# Patient Record
Sex: Female | Born: 1970 | Race: White | Hispanic: No | Marital: Married | Smoking: Never smoker
Health system: Southern US, Community
[De-identification: ages and names within clinical notes are randomized; demographics above are authoritative.]

## PROBLEM LIST (undated history)

## (undated) DIAGNOSIS — E785 Hyperlipidemia, unspecified: Secondary | ICD-10-CM

## (undated) DIAGNOSIS — E7800 Pure hypercholesterolemia, unspecified: Secondary | ICD-10-CM

## (undated) DIAGNOSIS — T7840XA Allergy, unspecified, initial encounter: Secondary | ICD-10-CM

## (undated) DIAGNOSIS — G479 Sleep disorder, unspecified: Secondary | ICD-10-CM

## (undated) DIAGNOSIS — N189 Chronic kidney disease, unspecified: Secondary | ICD-10-CM

## (undated) DIAGNOSIS — R110 Nausea: Secondary | ICD-10-CM

## (undated) DIAGNOSIS — K589 Irritable bowel syndrome without diarrhea: Secondary | ICD-10-CM

## (undated) DIAGNOSIS — K219 Gastro-esophageal reflux disease without esophagitis: Secondary | ICD-10-CM

## (undated) DIAGNOSIS — L709 Acne, unspecified: Secondary | ICD-10-CM

## (undated) DIAGNOSIS — M1990 Unspecified osteoarthritis, unspecified site: Secondary | ICD-10-CM

## (undated) HISTORY — DX: Irritable bowel syndrome, unspecified: K58.9

## (undated) HISTORY — DX: Hyperlipidemia, unspecified: E78.5

## (undated) HISTORY — DX: Allergy, unspecified, initial encounter: T78.40XA

---

## 1997-12-10 ENCOUNTER — Other Ambulatory Visit: Admission: RE | Admit: 1997-12-10 | Discharge: 1997-12-10 | Payer: Self-pay | Admitting: Obstetrics and Gynecology

## 1998-12-08 ENCOUNTER — Ambulatory Visit (HOSPITAL_COMMUNITY): Admission: RE | Admit: 1998-12-08 | Discharge: 1998-12-08 | Payer: Self-pay | Admitting: Obstetrics and Gynecology

## 1998-12-08 ENCOUNTER — Encounter: Payer: Self-pay | Admitting: Obstetrics and Gynecology

## 1998-12-13 ENCOUNTER — Other Ambulatory Visit: Admission: RE | Admit: 1998-12-13 | Discharge: 1998-12-13 | Payer: Self-pay | Admitting: Obstetrics and Gynecology

## 1999-10-05 ENCOUNTER — Inpatient Hospital Stay (HOSPITAL_COMMUNITY): Admission: AD | Admit: 1999-10-05 | Discharge: 1999-10-07 | Payer: Self-pay | Admitting: Obstetrics and Gynecology

## 1999-10-08 ENCOUNTER — Encounter: Admission: RE | Admit: 1999-10-08 | Discharge: 2000-01-06 | Payer: Self-pay | Admitting: Obstetrics and Gynecology

## 1999-11-08 ENCOUNTER — Other Ambulatory Visit: Admission: RE | Admit: 1999-11-08 | Discharge: 1999-11-08 | Payer: Self-pay | Admitting: Obstetrics and Gynecology

## 2000-12-14 ENCOUNTER — Encounter: Payer: Self-pay | Admitting: Internal Medicine

## 2000-12-14 ENCOUNTER — Ambulatory Visit (HOSPITAL_COMMUNITY): Admission: RE | Admit: 2000-12-14 | Discharge: 2000-12-14 | Payer: Self-pay | Admitting: Internal Medicine

## 2000-12-14 ENCOUNTER — Other Ambulatory Visit: Admission: RE | Admit: 2000-12-14 | Discharge: 2000-12-14 | Payer: Self-pay | Admitting: Obstetrics and Gynecology

## 2002-01-07 ENCOUNTER — Other Ambulatory Visit: Admission: RE | Admit: 2002-01-07 | Discharge: 2002-01-07 | Payer: Self-pay | Admitting: Obstetrics and Gynecology

## 2002-05-08 ENCOUNTER — Ambulatory Visit (HOSPITAL_COMMUNITY): Admission: RE | Admit: 2002-05-08 | Discharge: 2002-05-08 | Payer: Self-pay | Admitting: Internal Medicine

## 2002-05-08 ENCOUNTER — Encounter: Payer: Self-pay | Admitting: Internal Medicine

## 2003-01-29 ENCOUNTER — Other Ambulatory Visit: Admission: RE | Admit: 2003-01-29 | Discharge: 2003-01-29 | Payer: Self-pay | Admitting: Obstetrics and Gynecology

## 2003-03-14 HISTORY — PX: ECTOPIC PREGNANCY SURGERY: SHX613

## 2003-09-03 ENCOUNTER — Encounter (INDEPENDENT_AMBULATORY_CARE_PROVIDER_SITE_OTHER): Payer: Self-pay | Admitting: Specialist

## 2003-09-03 ENCOUNTER — Ambulatory Visit (HOSPITAL_COMMUNITY): Admission: RE | Admit: 2003-09-03 | Discharge: 2003-09-03 | Payer: Self-pay | Admitting: Obstetrics and Gynecology

## 2003-09-13 ENCOUNTER — Inpatient Hospital Stay (HOSPITAL_COMMUNITY): Admission: AD | Admit: 2003-09-13 | Discharge: 2003-09-13 | Payer: Self-pay | Admitting: Obstetrics and Gynecology

## 2003-12-11 ENCOUNTER — Ambulatory Visit (HOSPITAL_COMMUNITY): Admission: RE | Admit: 2003-12-11 | Discharge: 2003-12-11 | Payer: Self-pay | Admitting: Obstetrics and Gynecology

## 2004-02-25 ENCOUNTER — Other Ambulatory Visit: Admission: RE | Admit: 2004-02-25 | Discharge: 2004-02-25 | Payer: Self-pay | Admitting: Obstetrics and Gynecology

## 2004-11-30 ENCOUNTER — Other Ambulatory Visit: Admission: RE | Admit: 2004-11-30 | Discharge: 2004-11-30 | Payer: Self-pay | Admitting: Obstetrics and Gynecology

## 2005-06-07 ENCOUNTER — Inpatient Hospital Stay (HOSPITAL_COMMUNITY): Admission: AD | Admit: 2005-06-07 | Discharge: 2005-06-09 | Payer: Self-pay | Admitting: Obstetrics and Gynecology

## 2005-12-08 ENCOUNTER — Encounter: Payer: Self-pay | Admitting: Vascular Surgery

## 2005-12-08 ENCOUNTER — Ambulatory Visit (HOSPITAL_COMMUNITY): Admission: RE | Admit: 2005-12-08 | Discharge: 2005-12-08 | Payer: Self-pay | Admitting: Obstetrics and Gynecology

## 2007-03-14 HISTORY — PX: PARTIAL HYSTERECTOMY: SHX80

## 2007-06-17 ENCOUNTER — Inpatient Hospital Stay (HOSPITAL_COMMUNITY): Admission: RE | Admit: 2007-06-17 | Discharge: 2007-06-19 | Payer: Self-pay | Admitting: Obstetrics and Gynecology

## 2007-06-17 ENCOUNTER — Encounter (INDEPENDENT_AMBULATORY_CARE_PROVIDER_SITE_OTHER): Payer: Self-pay | Admitting: Obstetrics and Gynecology

## 2009-04-22 ENCOUNTER — Ambulatory Visit (HOSPITAL_COMMUNITY): Admission: RE | Admit: 2009-04-22 | Discharge: 2009-04-22 | Payer: Self-pay | Admitting: Internal Medicine

## 2010-07-26 NOTE — H&P (Signed)
NAMESHARONA, [name]                 ACCOUNT NO.:  [id number]   MEDICAL RECORD NO.:  [id number]          PATIENT TYPE:  AMB   LOCATION:  SDC                           FACILITY:  [facility]   PHYSICIAN:  Guy Sandifer. Henderson Cloud, M.D. DATE OF BIRTH:  Jan 27, 1971   DATE OF ADMISSION:  06/17/2007  DATE OF DISCHARGE:                              HISTORY & PHYSICAL   CHIEF COMPLAINT:  Pelvic pain.   HISTORY OF PRESENT ILLNESS:  This patient is a 40 year old, married,  white female G3, P2 with known severe endometriosis.  She has increasing  premenstrual and menstrual pain each month in her pelvis, lower back and  radiating down her legs.  After discussion of options, she is being  admitted for total abdominal hysterectomy and possible unilateral  salpingo-oophorectomy.  Dr. Vernie [name] will be placing bilateral ureteral  stents during the surgery.  Potential risks and complications have been  discussed with the patient preoperatively.   PAST MEDICAL HISTORY:  1. History of migraine headaches.  2. Exercise-induced asthma   PAST OBSTETRICAL HISTORY:  Vaginal delivery x2.  Ectopic pregnancy x1  treated with methotrexate and laparoscopy.   PAST SURGICAL HISTORY:  Laparoscopy with D&C in 2005.   FAMILY HISTORY:  Heart disease maternal grandmother.  Chronic  hypertension in mother with migraine headaches in mother and sister.  Fibromyalgia in mother.   MEDICATIONS:  1. Allegra one p.o. b.i.d.  2. Sudafed one p.o. daily.   ALLERGIES:  No known drug allergies.   SOCIAL HISTORY:  Denies tobacco, alcohol or drug abuse.   REVIEW OF SYSTEMS:  NEUROLOGIC:  History of headache as above.  CARDIOVASCULAR:  Denies chest pain.  PULMONARY:  Denies shortness of  breath.  GI:  Denies recent change in bowel habits.   PHYSICAL EXAMINATION:  VITAL SIGNS:  Height 5 feet 4 inches, weight  135.2 pounds, blood pressure 110/82.  HEENT:  Without thyromegaly.  LUNGS:  Clear to auscultation.  HEART:  Regular rate and  rhythm.  BACK:  No CVA tenderness.  BREASTS:  Without mass, retraction or discharge.  ABDOMEN:  Soft, nontender without masses.  PELVIC:  Vulva, vagina and cervix without lesions.  Uterus is mobile,  nontender.  Adnexa nontender without masses.  EXTREMITIES:  Grossly  within normal limits.  NEUROLOGIC:  Grossly within normal limits.   ASSESSMENT:  Endometriosis.   PLAN:  Total abdominal hysterectomy, possible unilateral salpingo-  oophorectomy with preoperative bilateral ureteral stents.      Guy Sandifer Henderson Cloud, M.D.  Electronically Signed     JET/MEDQ  D:  06/13/2007  T:  06/13/2007  Job:  [id number]

## 2010-07-26 NOTE — Op Note (Signed)
NAMEANTONIO, WOODHAMS                 ACCOUNT NO.:  [id number]   MEDICAL RECORD NO.:  [id number]          PATIENT TYPE:  AMB   LOCATION:  SDC                           [facility]ACILITY:  [facility]   PHYSICIAN:  Mark C. Vernie Ammons, M.D.  DATE OF BIRTH:  March 13, 1971   DATE OF PROCEDURE:  06/17/2007  DATE OF DISCHARGE:                               OPERATIVE REPORT   PREOPERATIVE DIAGNOSIS:  Extensive pelvic adhesions with planned  abdominal hysterectomy.   POSTOPERATIVE DIAGNOSIS:  Extensive pelvic adhesions with planned  abdominal hysterectomy.   PROCEDURE:  Cystoscopy and bilateral internal-external stent placement.   SURGEON:  Mark C. Vernie Ammons, M.D.   ANESTHESIA:  General.   DRAINS:  A 5-French internal-external stent in the left ureter and 6-  French internal-external stent in the right ureter as well as Foley  catheter.   BLOOD LOSS:  None.   SPECIMENS:  None.   COMPLICATIONS:  None.   INDICATIONS:  The patient is a 40 year old white female who has had  previous known pelvic adhesions seen laparoscopically and is scheduled  for abdominal hysterectomy as well as possible bilateral salpingo-  oophorectomy.  Because of the potential significant adhesions, ureteral  stents were inserted in order to more easily identify the ureters  intraoperatively.  I have discussed this with the patient as well as the  risks and complications.  She understands and has elected to proceed.   DESCRIPTION OF OPERATION:  After informed consent, the patient brought  to the major OR, placed on the table and administered general  anesthesia, then moved to the dorsal lithotomy position.  Her genitalia  was sterilely prepped and draped and a 21-French cystoscope with 12  degree lens was then inserted in the bladder after an official time-out  was performed.  The bladder was then fully inspected and noted to be  free of any tumor, stones or inflammatory lesions.  Ureteral orifices  were of normal configuration and  position.   A 0.038 inch floppy tip guidewire was then passed up the left ureter  under fluoroscopic visualization into the area of the renal pelvis,  after which the 5-French open-ended ureteral catheter was passed over  the guidewire and the guidewire was removed.  I then directed my  attention to the right orifice and an identical procedure was performed  using the 6-French open ended ureteral catheter.  I then drained the  bladder and inserted a Foley catheter and  connected both of the internal-external stents and Foley catheter to a  closed system drainage and the patient was then turned over to Dr.  Henderson Cloud who performed his portion of the procedure which will be  dictated by him separately.      Mark C. Vernie Ammons, M.D.  Electronically Signed     MCO/MEDQ  D:  06/17/2007  T:  06/17/2007  Job:  [id number]

## 2010-07-26 NOTE — Op Note (Signed)
[name], [name]                 ACCOUNT NO.:  [id number]   MEDICAL RECORD NO.:  [id number]          PATIENT TYPE:  AMB   LOCATION:  SDC                           FACILITY:  [facility]   PHYSICIAN:  Guy Sandifer. Henderson Cloud, M.D. DATE OF BIRTH:  September 09, 1970   DATE OF PROCEDURE:  06/17/2007  DATE OF DISCHARGE:                               OPERATIVE REPORT   PREOPERATIVE DIAGNOSIS:  Endometriosis.   POSTOPERATIVE DIAGNOSIS:  Endometriosis.   PROCEDURE:  Total abdominal hysterectomy.   SURGEON:  Harold Hedge, MD.   ASSISTANT:  Richardean Chimera, MD.   ANESTHESIA:  General with endotracheal intubation.   SPECIMENS:  Uterus to pathology.   ESTIMATED BLOOD LOSS:  300 mL.   INDICATIONS AND CONSENT:  This patient is a 40 year old, married, white  female, G3, P2 with known endometriosis.  Details are dictated in the  history and physical.  She is admitted for total abdominal hysterectomy,  possible unilateral salpingo-oophorectomy.  The risks and complications  have been discussed preoperatively including but not limited to  infection, organ damage, bleeding requiring transfusion of blood  products with possible transfusion reaction, HIV and hepatitis  acquisition, DVT, PE, pneumonia, fistula formation, postoperative  dyspareunia and recurrent endometriosis.  Options of retaining or  removing ovaries have been reviewed.  She would like to keep one or both  ovaries if possible and remove only one ovary if necessary.  Because her  history of adhesions noted on previous laparoscopy, Dr. Vernie Ammons will  place ureteral stents preoperatively.   FINDINGS:  The uterus is about 6 weeks in size.  Anterior and posterior  cul-de-sacs are normal.  There are small brown implants of endometriosis  on the right ovary. The left ovary appears normal.   DESCRIPTION OF PROCEDURE:  Dr. Vernie Ammons places bilateral ureteral stents  preoperatively.  This is dictated under a separate note.  The patient is  then prepped  abdominally and vaginally and draped in a sterile fashion.  A Pfannenstiel incision is made and dissection is carried out in layers  to the peritoneum.  The peritoneum is entered and the incision is  extended superiorly and inferiorly.  An O'Sullivan-O'Connor retractor is  placed.  A bladder blade is placed, the bowel is packed away and the  upper blade is placed.  The above findings were noted.  The proximal  ligaments are grasped bilaterally with Kelly clamps.  The left round  ligament is then ligated with zero Monocryl suture.  All suture will be  zero Monocryl unless otherwise designated.  The round ligament is then  taken down.  The anterior leaf of the broad ligament is taken down and  the posterior leaf is sharply perforated.  The ureter can be palpated  well below this point of surgery.  The proximal ligaments are taken down  and the pedicle is doubly ligated with a free tie and then with a  suture.  The vesicouterine peritoneum is taken down, bladder is bluntly  advanced.  After skeletonizing the uterine vessels, the uterine vessels  are taken in progressive bites.  A similar procedure is then  carried out  on the right side. The uterosacral ligaments are taken down while  entering the vaginal fornix bilaterally with curved clamps. These  pedicles are ligated with transfixion sutures.  The remainder of the  uterus is then cut free.  Inspection reveals the entire cervix has been  removed. The vaginal cuff is then closed with figure-of-eights.  The  uterosacral ligaments are plicated in the midline with a separate  suture.  The ovaries are suspended from the round ligaments bilaterally.  Careful attention is paid and irrigation is carried out and all bleeders  are cauterized. Good hemostasis is obtained.  All packs and retractors  and removed.  The anterior peritoneum was closed in running fashion with  zero Monocryl suture which was also used to reapproximate the  pyramidalis muscle  in the midline.  The anterior rectus fascia is closed  in running fashion with zero PDS and the skin is closed with clips.  The  Foley catheter as well as the yellow and green ureteral stents were  removed intact.  A fresh catheter was then replaced under sterile  conditions.  All counts correct.  The patient is awakened and taken to  the recovery room in stable condition.      Guy Sandifer Henderson Cloud, M.D.  Electronically Signed     JET/MEDQ  D:  06/17/2007  T:  06/17/2007  Job:  [id number]

## 2010-07-29 NOTE — Discharge Summary (Signed)
[name], [name]                 ACCOUNT NO.:  [id number]   MEDICAL RECORD NO.:  [id number]          PATIENT TYPE:  INP   LOCATION:  9308                          FACILITY:  [facility]   PHYSICIAN:  Guy Sandifer. Henderson Cloud, M.D. DATE OF BIRTH:  02-23-1971   DATE OF ADMISSION:  06/17/2007  DATE OF DISCHARGE:  06/19/2007                               DISCHARGE SUMMARY   ADMITTING DIAGNOSIS:  Endometriosis.   DISCHARGE DIAGNOSIS:  Endometriosis.   PROCEDURE:  On June 17, 2007, total abdominal hysterectomy.   REASON FOR ADMISSION:  This patient is a 40 year old married white  female, G3 P2, with known endometriosis.  She was admitted for surgical  management.   HOSPITAL COURSE:  The patient was admitted to the hospital.  She  undergoes the above procedure.  She has ureteral stent placed  preoperatively by Dr. Freida Busman.  These were removed postoperatively.  On  the evening of surgery, vital signs are stable, she is afebrile with  clear urine output.  On the first postoperative day, hemoglobin is 10.0.  Vital signs are stable.  She remains afebrile.  She has not yet passed  flatus.  On the day of discharge, she is tolerating regular diet and  ambulating well.  Vital signs are stable, she remains afebrile and  pathology is pending.   CONDITION ON DISCHARGE:  Good.   DIET:  Regular as tolerated.   ACTIVITY:  No lifting.  No operation of automobiles.  No vaginal entry.  She is to call the office for problems including but not limited to  temperature of 101, persistent nausea or vomiting, increasing pain, or  heavy vaginal bleeding.   MEDICATIONS:  1. Percocet 5/325 mg, #40, 1-2 p.o. q.6 h. p.r.n.  2. Ibuprofen 600 mg q.6 h. p.r.n.  3. Multivitamin daily.   FOLLOWUP:  Follow up with [place] in the office in 2 weeks.      Guy Sandifer Henderson Cloud, M.D.  Electronically Signed     JET/MEDQ  D:  07/08/2007  T:  07/08/2007  Job:  [id number]

## 2010-07-29 NOTE — Op Note (Signed)
[name], ROLL                             ACCOUNT NO.:  [id number]   MEDICAL RECORD NO.:  [id number]                   PATIENT TYPE:  AMB   LOCATION:  SDC                                  [facility]ILITY:  [facility]   PHYSICIAN:  Guy Sandifer. Arleta Creek, M.D.           DATE OF BIRTH:  05/31/1970   DATE OF PROCEDURE:  09/03/2003  DATE OF DISCHARGE:                                 OPERATIVE REPORT   PREOPERATIVE DIAGNOSIS:  Probable ectopic pregnancy.   POSTOPERATIVE DIAGNOSES:  1. Spontaneous abortion.  2. Bilateral ovarian cyst.  3. Dense pelvic adhesions.   PROCEDURES:  1. Laparoscopy.  2. Dilatation and curettage.   SURGEON:  Guy Sandifer. Henderson Cloud, M.D.   ANESTHESIA:  General with endotracheal intubation.   ESTIMATED BLOOD LOSS:  Less than 50 mL.   SPECIMENS:  Endometrial curettings.   INDICATIONS FOR PROCEDURE:  This patient is a 40 year old married white  female G2, P1 with an LMP of Aug 08, 2003.  Quantitative HCG on August 31, 2003, was 7244 and on September 03, 2003, 6878.  She has complaints of increasing  pelvic pain.  There is no syncope, nausea, vomiting, or fever.  Bowel and  bladder function is normal.  Ultrasound is consistent with a 9.1 cm cystic  right adnexal mass with a probable 2.6 cm gestational sac.  Left ovary  contains 4.8 and 4.7 cm simple cysts.  It is felt the picture in the right  adnexa is most consistent with a probable ectopic pregnancy and possible  hemorrhage in view of the patient's increasing pain.  The above is discussed  with the patient and her husband.  Laparoscopy with possible salpingectomy,  possible salpingostomy is discussed.  Potential risks and complications are  reviewed preoperatively including, but not limited to, infection, bowel,  bladder, ureteral damage, bleeding requiring transfusion of blood products  and possible transfusion reaction, HIV and hepatitis acquisition, DVT, PE,  and pneumonia.  All questions have been answered and consent is  signed on  the chart.   FINDINGS:  Upper abdomen is grossly normal.  Uterus is six weeks in size.  Posterior cul-de-sac is completely obliterated up to the mid level of the  fundus.  The left adnexa contains a translucent approximately 8 cm cystic  structure that is widely adherent to the pelvic sidewall.  The left  fallopian tube is normal.  The fimbria are somewhat gloved but there is no  kinking or stricture or abnormal swelling.  The right ovary is widely  adherent to the right pelvic sidewall and is enlarged to at least 8 to 10 cm  in size.  The right fallopian tube is edematous along its entire length.  There is a slight bulge in the middle one third possibly consistent with an  ectopic pregnancy.  There is no bleeding from the fallopian tube.  Anterior  cul-de-sac is normal.   DESCRIPTION OF PROCEDURE:  The patient is taken to the operating room where  she is identified, placed in the dorsal lithotomy position and general  anesthesia is induced via endotracheal intubation.  She is then placed in  the dorsal lithotomy position where she is prepped abdominally and  vaginally.  Bladder is straight catheterized. Hulka tenaculum is placed in  the uterus as a manipulator and she is draped in a sterile fashion.  A small  infraumbilical incision is made and a disposable Veress needle  is placed  with a normal syringe and drop test.  There are 1.5 liters of gas  insufflated under low pressure with good tympany in the right upper  quadrant.  Veress needle  is removed and is replaced with a 10-11 disposable  trocar sleeve without difficulty.  Placement is verified with a laparoscope  and no damage to the surrounding structures is noted.  A small suprapubic  incision is made and a 5 mm disposable trocar sleeve is placed under direct  visualization without difficulty.  The above findings are noted.  The left  lower quadrant incision is then made after careful transillumination and a 5  mm  disposable trocar sleeve is placed under direct visualization without  difficulty.  A bipolar needle from [employer] is used to open the serosa of the  right fallopian tube over the bulging area.  Careful dissection is carried  out which does enter the muscularis of the fallopian tube.  No decidual  tissue.  No ectopic pregnancy is located.  Excellent hemostasis is  maintained.  Careful inspection throughout again reveals no further evidence  of possible ectopic pregnancy.  Therefore, the trocar sleeves are removed,  pneumoperitoneum is reduced and umbilical trocar sleeve is removed.  A 2-0  Vicryl suture is used to close the subcutaneous tissues in the umbilical  incision.  All incisions are then injected with 0.5% Marcaine and Dermabond  is applied to the skin at all three incisions.  Attention is then returned  to the pelvis.  The Hulka tenaculum is removed.  Cervix is grasped with a  single-tooth tenaculum and is gently dilated up to a 29 Pratt dilator.  Alternating sharp and suction curettage is carried out for a small amount of  tissue.  There are 10 units of Pitocin added to the remaining 500 mL of IV  fluids.  Good hemostasis is noted.  All counts correct.  The patient is  awakened and taken to the recovery room in stable condition.  Blood type is  A positive.                                               Guy Sandifer Arleta Creek, M.D.    JET/MEDQ  D:  09/03/2003  T:  09/04/2003  Job:  [id number]

## 2010-08-11 ENCOUNTER — Encounter: Payer: Self-pay | Admitting: Podiatry

## 2010-12-06 LAB — URINALYSIS, ROUTINE W REFLEX MICROSCOPIC
Bilirubin Urine: NEGATIVE
Nitrite: NEGATIVE
Specific Gravity, Urine: 1.01
Urobilinogen, UA: 0.2
pH: 6

## 2010-12-06 LAB — COMPREHENSIVE METABOLIC PANEL
AST: 17
BUN: 7
CO2: 31
Calcium: 9.3
Chloride: 102
Creatinine, Ser: 0.71
GFR calc non Af Amer: >60
Glucose, Bld: 79
Total Bilirubin: 0.6

## 2010-12-06 LAB — CBC
HCT: 28.6 — ABNORMAL LOW
HCT: 40.5
Hemoglobin: 13.9
MCHC: 34.2
MCV: 83.8
Platelets: 182
RBC: 4.83
RDW: 12.7
WBC: 8.2

## 2011-05-26 ENCOUNTER — Ambulatory Visit (HOSPITAL_COMMUNITY): Payer: Self-pay

## 2011-05-26 ENCOUNTER — Other Ambulatory Visit (HOSPITAL_COMMUNITY): Payer: Self-pay | Admitting: Internal Medicine

## 2011-05-26 ENCOUNTER — Other Ambulatory Visit: Payer: Self-pay | Admitting: Internal Medicine

## 2011-05-26 ENCOUNTER — Other Ambulatory Visit: Payer: Self-pay

## 2011-05-26 DIAGNOSIS — K829 Disease of gallbladder, unspecified: Secondary | ICD-10-CM

## 2011-05-30 ENCOUNTER — Ambulatory Visit
Admission: RE | Admit: 2011-05-30 | Discharge: 2011-05-30 | Disposition: A | Discharge status: Home / Self Care | Payer: BC Managed Care – PPO | Source: Ambulatory Visit | Attending: Internal Medicine | Admitting: Internal Medicine

## 2011-05-30 DIAGNOSIS — K829 Disease of gallbladder, unspecified: Secondary | ICD-10-CM

## 2011-06-05 ENCOUNTER — Encounter (HOSPITAL_COMMUNITY)
Admission: RE | Admit: 2011-06-05 | Discharge: 2011-06-05 | Disposition: A | Discharge status: Home / Self Care | Payer: BC Managed Care – PPO | Source: Ambulatory Visit | Attending: Internal Medicine | Admitting: Internal Medicine

## 2011-06-05 ENCOUNTER — Other Ambulatory Visit (HOSPITAL_COMMUNITY): Payer: Self-pay | Admitting: Internal Medicine

## 2011-06-05 DIAGNOSIS — M545 Low back pain, unspecified: Secondary | ICD-10-CM | POA: Insufficient documentation

## 2011-06-05 DIAGNOSIS — R948 Abnormal results of function studies of other organs and systems: Secondary | ICD-10-CM | POA: Insufficient documentation

## 2011-06-05 DIAGNOSIS — R109 Unspecified abdominal pain: Secondary | ICD-10-CM | POA: Insufficient documentation

## 2011-06-05 DIAGNOSIS — R110 Nausea: Secondary | ICD-10-CM | POA: Insufficient documentation

## 2011-06-05 MED ORDER — TECHNETIUM TC 99M MEBROFENIN IV KIT
5.0000 | PACK | Freq: Once | INTRAVENOUS | Status: AC | PRN
  Administered 2011-06-05: 5 via INTRAVENOUS

## 2011-06-05 MED ORDER — SINCALIDE 5 MCG IJ SOLR
0.0200 ug/kg | Freq: Once | INTRAMUSCULAR | Status: AC
  Administered 2011-06-05: 15:00:00 via INTRAVENOUS

## 2011-06-05 MED ORDER — SINCALIDE 5 MCG IJ SOLR
INTRAMUSCULAR | Status: AC
  Filled 2011-06-05: qty 5

## 2011-06-07 ENCOUNTER — Encounter: Payer: Self-pay | Admitting: Internal Medicine

## 2011-06-07 ENCOUNTER — Ambulatory Visit (INDEPENDENT_AMBULATORY_CARE_PROVIDER_SITE_OTHER): Payer: BC Managed Care – PPO | Admitting: Internal Medicine

## 2011-06-07 VITALS — BP 100/76 | HR 100 | Ht 64.0 in | Wt 128.1 lb

## 2011-06-07 DIAGNOSIS — R5383 Other fatigue: Secondary | ICD-10-CM

## 2011-06-07 DIAGNOSIS — E785 Hyperlipidemia, unspecified: Secondary | ICD-10-CM | POA: Insufficient documentation

## 2011-06-07 DIAGNOSIS — R5381 Other malaise: Secondary | ICD-10-CM

## 2011-06-07 DIAGNOSIS — J45909 Unspecified asthma, uncomplicated: Secondary | ICD-10-CM | POA: Insufficient documentation

## 2011-06-07 DIAGNOSIS — R1011 Right upper quadrant pain: Secondary | ICD-10-CM

## 2011-06-07 DIAGNOSIS — E782 Mixed hyperlipidemia: Secondary | ICD-10-CM | POA: Insufficient documentation

## 2011-06-07 DIAGNOSIS — N200 Calculus of kidney: Secondary | ICD-10-CM | POA: Insufficient documentation

## 2011-06-07 DIAGNOSIS — R1013 Epigastric pain: Secondary | ICD-10-CM

## 2011-06-07 DIAGNOSIS — R110 Nausea: Secondary | ICD-10-CM

## 2011-06-07 DIAGNOSIS — K589 Irritable bowel syndrome without diarrhea: Secondary | ICD-10-CM | POA: Insufficient documentation

## 2011-06-07 MED ORDER — PANTOPRAZOLE SODIUM 40 MG PO TBEC: 40.0000 mg | DELAYED_RELEASE_TABLET | Freq: Every day | ORAL | Status: DC

## 2011-06-07 MED ORDER — TRAMADOL HCL 50 MG PO TABS: 50.0000 mg | ORAL_TABLET | Freq: Four times a day (QID) | ORAL | Status: AC | PRN

## 2011-06-07 NOTE — Patient Instructions (Addendum)
You have been scheduled for and Endoscopy at [facility] on 06/13/2011 @ 8:30am, please follow instructions given to you at your visit today.  You have been scheduled a pre-visit on 06/12/2011 @ 1pm at [facility].   You will be contacted about your referral to [facility] Dr. Ovidio Kin.

## 2011-06-07 NOTE — Progress Notes (Signed)
Subjective:    Patient ID: [name], female    DOB: Jun 17, 1970, 41 y.o.   MRN: [id number]  HPI [name] is a 41 year old female with a past medical history of mild asthma, IBS-C, and hyperlipidemia who seen in consultation at the request of Dr. Oneta Rack for evaluation of right-sided abdominal/chest pain. The patient reports about 3 weeks of right upper quadrant versus right chest pain which is radiating into her back and right shoulder. She has had 2 more acute episodes of such pain which lasted 1-2 hours. This pain has been severe and she has been taking as needed hydrocodone to help with the pain and to help with sleep. She has associated on and off nausea but no vomiting. The symptoms worsen with eating her appetite has been poor. She has been avoiding eating as to avoid the pain. She does not particularly have nocturnal pain. She does report more frequent belching, which is not normal for her. During her more acute episode she noticed increased pain with deep inspiration, but not necessarily dyspnea. This pain is nonexertional, and she does exercise frequently without pain. Though, she has avoided exercise recently to she just doesn't feel well. No change in her stools including no melena or blood. She does have a history of irritable bowel constipation for which she uses Amitiza. She is unaware of any fevers or chills.  Review of Systems As per history of present illness, otherwise neg  Past Medical History  Diagnosis Date  . Asthma   . IBS (irritable bowel syndrome)   . HLD (hyperlipidemia)    Past Surgical History  Procedure Date  . Ectopic pregnancy surgery 2005  . Partial hysterectomy 2009   Current Outpatient Prescriptions  Medication Sig Dispense Refill  . aspirin 81 MG tablet Take 81 mg by mouth daily.      . Cholecalciferol (VITAMIN D3) 5000 UNITS CAPS Take 1 capsule by mouth. Three times weekly      . gemfibrozil (LOPID) 600 MG tablet Take 600 mg by mouth 2 (two) times  daily.        Marland Kitchen guaiFENesin (MUCINEX) 600 MG 12 hr tablet Take 1,200 mg by mouth 2 (two) times daily.      Marland Kitchen HYDROcodone-acetaminophen (NORCO) 7.5-325 MG per tablet Take 1 tablet by mouth as needed.      . lubiprostone (AMITIZA) 8 MCG capsule Take 8 mcg by mouth 2 (two) times daily.        . meloxicam (MOBIC) 15 MG tablet Take 15 mg by mouth daily.        . minocycline (MINOCIN,DYNACIN) 100 MG capsule Take 100 mg by mouth daily.      . vitamin E 1000 UNIT capsule Take 1,000 Units by mouth 2 (two) times daily.        . pantoprazole (PROTONIX) 40 MG tablet Take 1 tablet (40 mg total) by mouth daily.  30 tablet  1  . traMADol (ULTRAM) 50 MG tablet Take 1 tablet (50 mg total) by mouth every 6 (six) hours as needed for pain.  120 tablet  1   No Known Allergies  Family History  Problem Relation Age of Onset  . Breast cancer Mother   . Stroke Maternal Grandmother     Social History  . 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 Smoking status: Never Smoker   . Smokeless tobacco: Never Used  .  Alcohol Use: No  . Drug Use: No      Objective:   Physical Exam BP 100/76  Pulse 100  Ht 5\' 4"  (1.626 m)  Wt 128 lb 2 oz (58.117 kg)  BMI 21.99 kg/m2 Constitutional: Well-developed and well-nourished. No distress. HEENT: Normocephalic and atraumatic. Oropharynx is clear and moist. No oropharyngeal exudate. Conjunctivae are normal. Pupils are equal round and reactive to light. No scleral icterus. Neck: Neck supple. Trachea midline. Cardiovascular: Normal rate, regular rhythm and intact distal pulses. No M/R/G, no significant pain with palpation over the right anterior rib cage Pulmonary/chest: Effort normal and breath sounds normal. No wheezing, rales or rhonchi. Abdominal: Soft, mild epigastric and right upper quadrant tenderness to palpation without rebound or guarding, nondistended. Bowel sounds active  throughout. There are no masses palpable. No hepatosplenomegaly. Extremities: no clubbing, cyanosis, or edema Lymphadenopathy: No cervical adenopathy noted. Neurological: Alert and oriented to person place and time. Skin: Skin is warm and dry. No rashes noted. Psychiatric: Normal mood and affect. Behavior is normal.  HIDA scan - 06/05/2011: Impression: There is demonstration of patency of the common bile duct and the cystic duct. There is no evidence of cholecystitis. During CCK infusion the gallbladder contracted 20.4%. This is abnormally low. 30% or greater is normal range. During CCK infusion the patient reported experiencing no symptoms.  ABDOMINAL ULTRASOUND COMPLETE, 05/30/2011   Comparison:  None.   Findings:   Gallbladder:  No gallstones, gallbladder wall thickening, or pericholecystic fluid.Sonographic Murphy's sign is negative.   Common Bile Duct:  Within normal limits in caliber.1.6 mm.   Liver: No focal mass lesion identified.  Within normal limits in parenchymal echogenicity. Portal vein is patent and demonstrates normal direction of flow.   IVC:  Appears normal.   Pancreas:  Although the pancreas is difficult to visualize in its entirety, no focal pancreatic abnormality is identified. Marland Kitchen   Spleen:  Within normal limits in size and echotexture.Measures 5.0 cm in length.   Right kidney:  Normal in size and parenchymal echogenicity.  No evidence of mass or hydronephrosis. A small (6 mm)echogenic focus with suspected posterior acoustic shadowing in the upper right kidney.  Cannot exclude a nonobstructing small stone; however this is not definite.   Left kidney:  Normal in size and parenchymal echogenicity.  No evidence of mass or hydronephrosis.   Abdominal Aorta:  No aneurysm identified.   IMPRESSION:   1.  Possible 6 mm nonobstructing stone in the upper pole the right kidney. 2.  Otherwise, negative examination.  Specifically, the gallbladder, biliary tree,  and liver are normal.  Labs, performed 05/26/2011 WBC 4.3, hemoglobin 13.7, hematocrit 41.7, MCV 85.5, platelet count 191 BMP within normal limits Total bili 0.5 alkaline phosphatase 58, AST 14 ALT 9 Amylase 24     Assessment & Plan:   41 year old female with a past medical history of mild asthma, IBS-C, and hyperlipidemia who seen in consultation at the request of Dr. Oneta Rack for evaluation of right-sided abdominal/chest pain  1. Right-sided abdominal pain/nausea/fatigue -- at present, I feel the differential diagnosis for her overall symptoms include gallbladder disease and peptic ulcer disease. Between the 2, I favor gallbladder dysfunction, though her ultrasound did not reveal stones or evidence of cholecystitis. Her HIDA scan did reveal an abnormally low gallbladder ejection fraction. She does have some risk factors for peptic ulcer disease, namely daily meloxicam use. She's not currently on PPI therapy. I think this is very unlikely to be cardiac related chest pain, given her lack  of risk factors and nature of her pain. She did have a recent unremarkable EKG.  At this point, we have discussed how to proceed with the diagnostic evaluation. I feel that it makes sense to rule out peptic ulcer disease, and therefore I recommended upper endoscopy. I will start her empirically on pantoprazole 40 mg daily. We have arranged EGD next Tuesday at [facility], if this is negative, she is to be referred to Dr. Ovidio Kin with [facility] for evaluation of gallbladder disease/cholecystectomy. I will also give her a prescription for tramadol 50 mg every 4-6 hours when necessary pain. This is to be used in place of hydrocodone, hopefully so that she can function at work without the sedation side effects of hydrocodone. She can still use the hydrocodone when necessary.

## 2011-06-12 ENCOUNTER — Encounter (HOSPITAL_COMMUNITY): Payer: Self-pay

## 2011-06-12 ENCOUNTER — Encounter (HOSPITAL_COMMUNITY)
Admission: RE | Admit: 2011-06-12 | Discharge: 2011-06-12 | Disposition: A | Discharge status: Home / Self Care | Payer: BC Managed Care – PPO | Source: Ambulatory Visit | Attending: Internal Medicine | Admitting: Internal Medicine

## 2011-06-12 NOTE — Anesthesia Preprocedure Evaluation (Signed)
Anesthesia Evaluation  Patient identified by MRN, date of birth, ID band Patient awake    Reviewed: Allergy & Precautions, H&P , NPO status , Patient's Chart, lab work & pertinent test results  Airway Mallampati: I TM Distance: >3 FB Neck ROM: Full    Dental  (+) Teeth Intact and Dental Advisory Given   Pulmonary asthma ,  breath sounds clear to auscultation  Pulmonary exam normal       Cardiovascular negative cardio ROS  Rhythm:Regular Rate:Normal     Neuro/Psych negative neurological ROS  negative psych ROS   GI/Hepatic negative GI ROS, Neg liver ROS,   Endo/Other  negative endocrine ROS  Renal/GU negative Renal ROS  negative genitourinary   Musculoskeletal negative musculoskeletal ROS (+)   Abdominal   Peds  Hematology negative hematology ROS (+)   Anesthesia Other Findings   Reproductive/Obstetrics negative OB ROS                           Anesthesia Physical Anesthesia Plan  ASA: I  Anesthesia Plan: MAC   Post-op Pain Management:    Induction: Intravenous  Airway Management Planned: Nasal Cannula  Additional Equipment:   Intra-op Plan:   Post-operative Plan: Extubation in OR  Informed Consent: I have reviewed the patients History and Physical, chart, labs and discussed the procedure including the risks, benefits and alternatives for the proposed anesthesia with the patient or authorized representative who has indicated his/her understanding and acceptance.   Dental advisory given  Plan Discussed with:   Anesthesia Plan Comments:         Anesthesia Quick Evaluation

## 2011-06-13 ENCOUNTER — Encounter (HOSPITAL_COMMUNITY): Payer: Self-pay | Admitting: Anesthesiology

## 2011-06-13 ENCOUNTER — Ambulatory Visit (HOSPITAL_COMMUNITY): Payer: BC Managed Care – PPO | Admitting: Anesthesiology

## 2011-06-13 ENCOUNTER — Ambulatory Visit (HOSPITAL_COMMUNITY)
Admission: RE | Admit: 2011-06-13 | Discharge: 2011-06-13 | Disposition: A | Discharge status: Home / Self Care | Payer: BC Managed Care – PPO | Source: Ambulatory Visit | Attending: Internal Medicine | Admitting: Internal Medicine

## 2011-06-13 ENCOUNTER — Encounter (HOSPITAL_COMMUNITY)
Admission: RE | Disposition: A | Discharge status: Home / Self Care | Payer: Self-pay | Source: Ambulatory Visit | Attending: Internal Medicine

## 2011-06-13 ENCOUNTER — Encounter (HOSPITAL_COMMUNITY): Payer: Self-pay | Admitting: *Deleted

## 2011-06-13 DIAGNOSIS — K3189 Other diseases of stomach and duodenum: Secondary | ICD-10-CM | POA: Insufficient documentation

## 2011-06-13 DIAGNOSIS — E785 Hyperlipidemia, unspecified: Secondary | ICD-10-CM | POA: Insufficient documentation

## 2011-06-13 DIAGNOSIS — J45909 Unspecified asthma, uncomplicated: Secondary | ICD-10-CM | POA: Insufficient documentation

## 2011-06-13 DIAGNOSIS — Z79899 Other long term (current) drug therapy: Secondary | ICD-10-CM | POA: Insufficient documentation

## 2011-06-13 DIAGNOSIS — K2970 Gastritis, unspecified, without bleeding: Secondary | ICD-10-CM | POA: Insufficient documentation

## 2011-06-13 DIAGNOSIS — R141 Gas pain: Secondary | ICD-10-CM | POA: Insufficient documentation

## 2011-06-13 DIAGNOSIS — Z7982 Long term (current) use of aspirin: Secondary | ICD-10-CM | POA: Insufficient documentation

## 2011-06-13 DIAGNOSIS — R1013 Epigastric pain: Secondary | ICD-10-CM | POA: Insufficient documentation

## 2011-06-13 DIAGNOSIS — R071 Chest pain on breathing: Secondary | ICD-10-CM | POA: Insufficient documentation

## 2011-06-13 DIAGNOSIS — R142 Eructation: Secondary | ICD-10-CM | POA: Insufficient documentation

## 2011-06-13 DIAGNOSIS — K589 Irritable bowel syndrome without diarrhea: Secondary | ICD-10-CM | POA: Insufficient documentation

## 2011-06-13 DIAGNOSIS — R110 Nausea: Secondary | ICD-10-CM | POA: Insufficient documentation

## 2011-06-13 HISTORY — PX: ESOPHAGOGASTRODUODENOSCOPY: SHX5428

## 2011-06-13 SURGERY — EGD (ESOPHAGOGASTRODUODENOSCOPY)
Anesthesia: Monitor Anesthesia Care

## 2011-06-13 MED ORDER — LACTATED RINGERS IV SOLN
INTRAVENOUS | Status: DC
  Administered 2011-06-13 (×2): 1000 mL via INTRAVENOUS

## 2011-06-13 MED ORDER — FENTANYL CITRATE 0.05 MG/ML IJ SOLN
INTRAMUSCULAR | Status: DC | PRN
  Administered 2011-06-13: 100 ug via INTRAVENOUS

## 2011-06-13 MED ORDER — BUTAMBEN-TETRACAINE-BENZOCAINE 2-2-14 % EX AERO
INHALATION_SPRAY | CUTANEOUS | Status: DC | PRN
  Administered 2011-06-13: 2 via TOPICAL

## 2011-06-13 MED ORDER — PROPOFOL 10 MG/ML IV EMUL
INTRAVENOUS | Status: DC | PRN
  Administered 2011-06-13: 100 ug/kg/min via INTRAVENOUS

## 2011-06-13 MED ORDER — MIDAZOLAM HCL 5 MG/5ML IJ SOLN
INTRAMUSCULAR | Status: DC | PRN
  Administered 2011-06-13: 2 mg via INTRAVENOUS
  Administered 2011-06-13: 1 mg via INTRAVENOUS

## 2011-06-13 MED ORDER — KETAMINE HCL 10 MG/ML IJ SOLN
INTRAMUSCULAR | Status: DC | PRN
  Administered 2011-06-13 (×2): 10 mg via INTRAVENOUS

## 2011-06-13 NOTE — H&P (View-Only) (Signed)
Subjective:    Patient ID: [name], female    DOB: March 26, 1970, 41 y.o.   MRN: [id number]  HPI [name] is a 41 year old female with a past medical history of mild asthma, IBS-C, and hyperlipidemia who seen in consultation at the request of Dr. Oneta Rack for evaluation of right-sided abdominal/chest pain. The patient reports about 3 weeks of right upper quadrant versus right chest pain which is radiating into her back and right shoulder. She has had 2 more acute episodes of such pain which lasted 1-2 hours. This pain has been severe and she has been taking as needed hydrocodone to help with the pain and to help with sleep. She has associated on and off nausea but no vomiting. The symptoms worsen with eating her appetite has been poor. She has been avoiding eating as to avoid the pain. She does not particularly have nocturnal pain. She does report more frequent belching, which is not normal for her. During her more acute episode she noticed increased pain with deep inspiration, but not necessarily dyspnea. This pain is nonexertional, and she does exercise frequently without pain. Though, she has avoided exercise recently to she just doesn't feel well. No change in her stools including no melena or blood. She does have a history of irritable bowel constipation for which she uses Amitiza. She is unaware of any fevers or chills.  Review of Systems As per history of present illness, otherwise neg  Past Medical History  Diagnosis Date  . Asthma   . IBS (irritable bowel syndrome)   . HLD (hyperlipidemia)    Past Surgical History  Procedure Date  . Ectopic pregnancy surgery 2005  . Partial hysterectomy 2009   Current Outpatient Prescriptions  Medication Sig Dispense Refill  . aspirin 81 MG tablet Take 81 mg by mouth daily.      . Cholecalciferol (VITAMIN D3) 5000 UNITS CAPS Take 1 capsule by mouth. Three times weekly      . gemfibrozil (LOPID) 600 MG tablet Take 600 mg by mouth 2 (two) times  daily.        Marland Kitchen guaiFENesin (MUCINEX) 600 MG 12 hr tablet Take 1,200 mg by mouth 2 (two) times daily.      Marland Kitchen HYDROcodone-acetaminophen (NORCO) 7.5-325 MG per tablet Take 1 tablet by mouth as needed.      . lubiprostone (AMITIZA) 8 MCG capsule Take 8 mcg by mouth 2 (two) times daily.        . meloxicam (MOBIC) 15 MG tablet Take 15 mg by mouth daily.        . minocycline (MINOCIN,DYNACIN) 100 MG capsule Take 100 mg by mouth daily.      . vitamin E 1000 UNIT capsule Take 1,000 Units by mouth 2 (two) times daily.        . pantoprazole (PROTONIX) 40 MG tablet Take 1 tablet (40 mg total) by mouth daily.  30 tablet  1  . traMADol (ULTRAM) 50 MG tablet Take 1 tablet (50 mg total) by mouth every 6 (six) hours as needed for pain.  120 tablet  1   No Known Allergies  Family History  Problem Relation Age of Onset  . Breast cancer Mother   . Stroke Maternal Grandmother     Social History  . 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 Smoking status: Never Smoker   . Smokeless tobacco: Never Used  .  Alcohol Use: No  . Drug Use: No      Objective:   Physical Exam BP 100/76  Pulse 100  Ht 5\' 4"  (1.626 m)  Wt 128 lb 2 oz (58.117 kg)  BMI 21.99 kg/m2 Constitutional: Well-developed and well-nourished. No distress. HEENT: Normocephalic and atraumatic. Oropharynx is clear and moist. No oropharyngeal exudate. Conjunctivae are normal. Pupils are equal round and reactive to light. No scleral icterus. Neck: Neck supple. Trachea midline. Cardiovascular: Normal rate, regular rhythm and intact distal pulses. No M/R/G, no significant pain with palpation over the right anterior rib cage Pulmonary/chest: Effort normal and breath sounds normal. No wheezing, rales or rhonchi. Abdominal: Soft, mild epigastric and right upper quadrant tenderness to palpation without rebound or guarding, nondistended. Bowel sounds active  throughout. There are no masses palpable. No hepatosplenomegaly. Extremities: no clubbing, cyanosis, or edema Lymphadenopathy: No cervical adenopathy noted. Neurological: Alert and oriented to person place and time. Skin: Skin is warm and dry. No rashes noted. Psychiatric: Normal mood and affect. Behavior is normal.  HIDA scan - 06/05/2011: Impression: There is demonstration of patency of the common bile duct and the cystic duct. There is no evidence of cholecystitis. During CCK infusion the gallbladder contracted 20.4%. This is abnormally low. 30% or greater is normal range. During CCK infusion the patient reported experiencing no symptoms.  ABDOMINAL ULTRASOUND COMPLETE, 05/30/2011   Comparison:  None.   Findings:   Gallbladder:  No gallstones, gallbladder wall thickening, or pericholecystic fluid.Sonographic Murphy's sign is negative.   Common Bile Duct:  Within normal limits in caliber.1.6 mm.   Liver: No focal mass lesion identified.  Within normal limits in parenchymal echogenicity. Portal vein is patent and demonstrates normal direction of flow.   IVC:  Appears normal.   Pancreas:  Although the pancreas is difficult to visualize in its entirety, no focal pancreatic abnormality is identified. Marland Kitchen   Spleen:  Within normal limits in size and echotexture.Measures 5.0 cm in length.   Right kidney:  Normal in size and parenchymal echogenicity.  No evidence of mass or hydronephrosis. A small (6 mm)echogenic focus with suspected posterior acoustic shadowing in the upper right kidney.  Cannot exclude a nonobstructing small stone; however this is not definite.   Left kidney:  Normal in size and parenchymal echogenicity.  No evidence of mass or hydronephrosis.   Abdominal Aorta:  No aneurysm identified.   IMPRESSION:   1.  Possible 6 mm nonobstructing stone in the upper pole the right kidney. 2.  Otherwise, negative examination.  Specifically, the gallbladder, biliary tree,  and liver are normal.  Labs, performed 05/26/2011 WBC 4.3, hemoglobin 13.7, hematocrit 41.7, MCV 85.5, platelet count 191 BMP within normal limits Total bili 0.5 alkaline phosphatase 58, AST 14 ALT 9 Amylase 24     Assessment & Plan:   41 year old female with a past medical history of mild asthma, IBS-C, and hyperlipidemia who seen in consultation at the request of Dr. Oneta Rack for evaluation of right-sided abdominal/chest pain  1. Right-sided abdominal pain/nausea/fatigue -- at present, I feel the differential diagnosis for her overall symptoms include gallbladder disease and peptic ulcer disease. Between the 2, I favor gallbladder dysfunction, though her ultrasound did not reveal stones or evidence of cholecystitis. Her HIDA scan did reveal an abnormally low gallbladder ejection fraction. She does have some risk factors for peptic ulcer disease, namely daily meloxicam use. She's not currently on PPI therapy. I think this is very unlikely to be cardiac related chest pain, given her lack  of risk factors and nature of her pain. She did have a recent unremarkable EKG.  At this point, we have discussed how to proceed with the diagnostic evaluation. I feel that it makes sense to rule out peptic ulcer disease, and therefore I recommended upper endoscopy. I will start her empirically on pantoprazole 40 mg daily. We have arranged EGD next Tuesday at [facility], if this is negative, she is to be referred to Dr. Ovidio Kin with [facility] for evaluation of gallbladder disease/cholecystectomy. I will also give her a prescription for tramadol 50 mg every 4-6 hours when necessary pain. This is to be used in place of hydrocodone, hopefully so that she can function at work without the sedation side effects of hydrocodone. She can still use the hydrocodone when necessary.

## 2011-06-13 NOTE — Op Note (Signed)
[facility] [address] [place], [place]  [address]  ENDOSCOPY PROCEDURE REPORT  PATIENT:  Jennifer, [name]  MR#:  [id number] BIRTHDATE:  [birth year], 40 yrs. old  GENDER:  female ENDOSCOPIST:  Carie Caddy. Mariyana Fulop, MD Referred by:  Lucky Cowboy, M.D. PROCEDURE DATE:  06/13/2011 PROCEDURE:  EGD with biopsy for H. pylori 95284 ASA CLASS:  Class II INDICATIONS:  epigastric pain, dyspepsia, nausea MEDICATIONS:   MAC sedation, administered by CRNA, See Anesthesia Report. TOPICAL ANESTHETIC:  Cetacaine Spray  DESCRIPTION OF PROCEDURE:   After the risks benefits and alternatives of the procedure were thoroughly explained, informed consent was obtained.  The [id number] ([id number]) endoscope was introduced through the mouth and advanced to the second portion of the duodenum, without limitations.  The instrument was slowly withdrawn as the mucosa was fully examined. <<PROCEDUREIMAGES>>  The esophagus and gastroesophageal junction were completely normal in appearance.  Moderate gastritis was found in the body and the antrum of the stomach. Biopsies of the antrum and body of the stomach were obtained and sent to pathology.  The duodenal bulb was normal in appearance, as was the postbulbar duodenum. Retroflexed views revealed no abnormalities.    The scope was then withdrawn from the patient and the procedure completed.  COMPLICATIONS:  None  ENDOSCOPIC IMPRESSION: 1) Normal esophagus 2) Moderate gastritis in the body and the antrum of the stomach. Multiple biopsies taken and sent to pathology. 3) Normal duodenum  RECOMMENDATIONS: 1) Await biopsy results 2) Continue current medications 3) Follow-up of helicobacter pylori status, treat if indicated  Shaylie Eklund M. Rhea Belton, MD  CC:  Lucky Cowboy, MD The Patient  n. eSIGNED:   Carie Caddy. Trev Boley at 06/13/2011 08:54 AM  Alfonso Ramus, [phone]

## 2011-06-13 NOTE — Discharge Instructions (Addendum)
You will be contacted later this week with results of today's biopsies. If H. Pylori infection is found, we will prescribe treatment for you. Please continue with daily pantoprazole 40 mg (30 min to 1 hour before breakfast). Call the office with any questions or concerns [phone])Endoscopy Care After Please read the instructions outlined below and refer to this sheet in the next few weeks. These discharge instructions provide you with general information on caring for yourself after you leave the hospital. Your doctor may also give you specific instructions. While your treatment has been planned according to the most current medical practices available, unavoidable complications occasionally occur. If you have any problems or questions after discharge, please call your doctor. HOME CARE INSTRUCTIONS Activity  You may resume your regular activity but move at a slower pace for the next 24 hours.   Take frequent rest periods for the next 24 hours.   Walking will help expel (get rid of) the air and reduce the bloated feeling in your abdomen.   No driving for 24 hours (because of the anesthesia (medicine) used during the test).   You may shower.   Do not sign any important legal documents or operate any machinery for 24 hours (because of the anesthesia used during the test).  Nutrition  Drink plenty of fluids.   You may resume your normal diet.   Begin with a light meal and progress to your normal diet.   Avoid alcoholic beverages for 24 hours or as instructed by your caregiver.  Medications You may resume your normal medications unless your caregiver tells you otherwise. What you can expect today  You may experience abdominal discomfort such as a feeling of fullness or "gas" pains.   You may experience a sore throat for 2 to 3 days. This is normal. Gargling with salt water may help this.  Follow-up Your doctor will discuss the results of your test with you. SEEK IMMEDIATE  MEDICAL CARE IF:  You have excessive nausea (feeling sick to your stomach) and/or vomiting.   You have severe abdominal pain and distention (swelling).   You have trouble swallowing.   You have a temperature over 100 F (37.8 C).   You have rectal bleeding or vomiting of blood.  Document Released: 10/12/2003 Document Revised: 02/16/2011 Document Reviewed: 04/24/2007 [facility] Patient Information 2012 [place], [place].

## 2011-06-13 NOTE — Transfer of Care (Signed)
Immediate Anesthesia Transfer of Care Note  Patient: [name]  Procedure(s) Performed: Procedure(s) (LRB): ESOPHAGOGASTRODUODENOSCOPY (EGD) (N/A)  Patient Location: PACU and Endoscopy Unit  Anesthesia Type: MAC  Level of Consciousness: awake, oriented, sedated and patient cooperative  Airway & Oxygen Therapy: Patient Spontanous Breathing and Patient connected to nasal cannula oxygen  Post-op Assessment: Report given to PACU RN and Post -op Vital signs reviewed and stable  Post vital signs: Reviewed and stable  Complications: No apparent anesthesia complications

## 2011-06-13 NOTE — Anesthesia Postprocedure Evaluation (Signed)
  Anesthesia Post-op Note  Patient: [name]  Procedure(s) Performed: Procedure(s) (LRB): ESOPHAGOGASTRODUODENOSCOPY (EGD) (N/A)  Patient Location: PACU  Anesthesia Type: MAC  Level of Consciousness: awake and alert   Airway and Oxygen Therapy: Patient Spontanous Breathing  Post-op Pain: mild  Post-op Assessment: Post-op Vital signs reviewed, Patient's Cardiovascular Status Stable, Respiratory Function Stable, Patent Airway and No signs of Nausea or vomiting  Post-op Vital Signs: stable  Complications: No apparent anesthesia complications

## 2011-06-13 NOTE — Interval H&P Note (Signed)
History and Physical Interval Note: Some improvement in nausea and belching.  Still intermittent sharp epigastric pains after eating.  06/13/2011 8:27 AM  [name]  has presented today for surgery, with the diagnosis of upper gastric pain  The various methods of treatment have been discussed with the patient and family. After consideration of risks, benefits and other options for treatment, the patient has consented to  Procedure(s) (LRB): ESOPHAGOGASTRODUODENOSCOPY (EGD) (N/A) as a surgical intervention .  The patients' history has been reviewed, patient examined, no change in status, stable for surgery.  I have reviewed the patients' chart and labs.  Questions were answered to the patient's satisfaction.     Carie Caddy. Adian Jablonowski, M.D.  06/13/2011

## 2011-06-14 ENCOUNTER — Encounter (HOSPITAL_COMMUNITY): Payer: Self-pay | Admitting: Internal Medicine

## 2011-06-16 ENCOUNTER — Telehealth (INDEPENDENT_AMBULATORY_CARE_PROVIDER_SITE_OTHER): Payer: Self-pay

## 2011-06-16 ENCOUNTER — Telehealth: Payer: Self-pay | Admitting: *Deleted

## 2011-06-16 NOTE — Telephone Encounter (Signed)
The patient has an abnl HIDA scan and Dr Rhea Belton would like this appointment moved up sooner.  I told her I will speak to Dr Ezzard Standing and see what we can work out.

## 2011-06-16 NOTE — Telephone Encounter (Signed)
Dr Rhea Belton was not satisfied with pt's appt with Dr Ezzard Standing. Spoke with Maralyn Sago, his nurse who will speak with Dr Ezzard Standing about an earlier appt.

## 2011-06-16 NOTE — Telephone Encounter (Signed)
Message copied by Florene Glen on Fri Jun 16, 2011  2:42 PM ------      Message from: Beverley Fiedler      Created: Fri Jun 16, 2011  9:10 AM       Path results shared by phone.  H Pylori is negative.  Only mild inflammation      Pt continues to have intermittent episodes of pain, today at 5am.  Helped by tramadol.            Please place referral to Dr. Ezzard Standing, with [facility] for eval of gallbladder dysfunction.  This consult may have already been placed/request via [facility], but patient has not heard.      Can you please contact her with [facility] referral info, today on her cell at [phone]       I have recommended a low fat diet and call with more problems or questions.

## 2011-06-20 ENCOUNTER — Encounter: Payer: Self-pay | Admitting: Internal Medicine

## 2011-06-20 NOTE — Telephone Encounter (Signed)
See previous note

## 2011-06-21 NOTE — Telephone Encounter (Signed)
[name] informed me she moved up pt's appt to 06/26/11, arrive at 3pm. Notified pt and gave her directions to the office.; pt stated understanding.

## 2011-06-21 NOTE — Telephone Encounter (Signed)
Spoke with Sarah with Dr Ezzard Standing who will try to speak with Dr Ezzard Standing today about seeing pt earlier.

## 2011-06-26 ENCOUNTER — Ambulatory Visit (INDEPENDENT_AMBULATORY_CARE_PROVIDER_SITE_OTHER): Payer: BC Managed Care – PPO | Admitting: Surgery

## 2011-06-26 ENCOUNTER — Other Ambulatory Visit (INDEPENDENT_AMBULATORY_CARE_PROVIDER_SITE_OTHER): Payer: Self-pay | Admitting: Surgery

## 2011-06-26 ENCOUNTER — Encounter (INDEPENDENT_AMBULATORY_CARE_PROVIDER_SITE_OTHER): Payer: Self-pay | Admitting: Surgery

## 2011-06-26 ENCOUNTER — Telehealth (INDEPENDENT_AMBULATORY_CARE_PROVIDER_SITE_OTHER): Payer: Self-pay | Admitting: Surgery

## 2011-06-26 VITALS — BP 104/80 | HR 78 | Temp 98.6°F | Resp 16 | Ht 64.0 in | Wt 127.0 lb

## 2011-06-26 DIAGNOSIS — K829 Disease of gallbladder, unspecified: Secondary | ICD-10-CM

## 2011-06-26 DIAGNOSIS — R1013 Epigastric pain: Secondary | ICD-10-CM

## 2011-06-26 NOTE — Progress Notes (Signed)
Re:   [name] DOB:   1970-05-11 MRN:   [id number]  ASSESSMENT AND PLAN: 1.  Biliary dyskinesia  I discussed with the patient the indications and risks of gall bladder surgery.  The primary risks of gall bladder surgery include, but are not limited to, bleeding, infection, common bile duct injury, and open surgery.  There is also the risk that the patient may have continued symptoms after surgery.  However, the likelihood of improvement in symptoms and return to the patient's normal status is good. We discussed the typical post-operative recovery course. I tried to answer the patient's questions.  I gave the patient literature about gall bladder surgery.  2.  Asthma, minimal 3.  Hyperlipidemia. 4  Asymptomatic right kidney stone.  Chief Complaint  Patient presents with  . Abdominal Pain  . Nausea   REFERRING PHYSICIAN: Nadean Corwin, MD, MD  HISTORY OF PRESENT ILLNESS: [name] is a 41 y.o. (DOB: 29-Jul-1970)  white female whose primary care physician is Jennifer Corwin, MD, MD and comes to me today for gall bladder disease.  This began with a couple episodes of right chest and back pain.  It came after she was on the treadmill.  She had belching, it hurt to take a deep breath, and she had some nausea after eating.  Because an US showed a right kidney stone, she saw [name] at the urology office.  She did a CT scan, saw the stone and a cyst on her ovary, but no other explanation of the pain.  She was referred to [name], who did an upper endoscopy, which was basically negatve.  She was referred to me with biliary dyskinesia, based on her HIDA scan.  She's also had two more attacks since this all began, but one was after pizza.  She does see some correlation between fatty food and the attacks.  She's has some symptom improvement with Protonix given by [name].  HIDA scan - 06/05/2011 - 20% EF of GB US of abd - 05/30/2011 - possible 6 mm right upper pole  kidney stone, no gall bladder disease. Upper endo - Dr. Shela Commons. [name] - 06/13/2011 -  Negative.  I took care of both her mother and her sister with GB disease.    Past Medical History  Diagnosis Date  . Asthma   . IBS (irritable bowel syndrome)   . HLD (hyperlipidemia)       Past Surgical History  Procedure Date  . Ectopic pregnancy surgery 2005  . Partial hysterectomy 2009  . Esophagogastroduodenoscopy 06/13/2011    Procedure: ESOPHAGOGASTRODUODENOSCOPY (EGD);  Surgeon: Jennifer Fiedler, MD;  Location: [facility];  Service: Gastroenterology;  Laterality: N/A;      Current Outpatient Prescriptions  Medication Sig Dispense Refill  . aspirin 81 MG tablet Take 81 mg by mouth daily.      . Cholecalciferol (VITAMIN D3) 5000 UNITS CAPS Take 1 capsule by mouth. Three times weekly      . gemfibrozil (LOPID) 600 MG tablet Take 600 mg by mouth 2 (two) times daily.        Marland Kitchen guaiFENesin (MUCINEX) 600 MG 12 hr tablet Take 1,200 mg by mouth 2 (two) times daily.      Marland Kitchen HYDROcodone-acetaminophen (NORCO) 7.5-325 MG per tablet Take 1 tablet by mouth as needed.      . lubiprostone (AMITIZA) 8 MCG capsule Take 8 mcg by mouth 2 (two) times daily.        Marland Kitchen  meloxicam (MOBIC) 15 MG tablet Take 15 mg by mouth daily.        . minocycline (MINOCIN,DYNACIN) 100 MG capsule Take 100 mg by mouth daily.      . pantoprazole (PROTONIX) 40 MG tablet Take 1 tablet (40 mg total) by mouth daily.  30 tablet  1  . vitamin E 1000 UNIT capsule Take 1,000 Units by mouth 2 (two) times daily.           No Known Allergies  REVIEW OF SYSTEMS: Skin:  No history of rash.  No history of abnormal moles. Infection:  No history of hepatitis or HIV.  No history of MRSA. Neurologic:  No history of stroke.  No history of seizure.  No history of headaches. Cardiac:  No history of hypertension. No history of heart disease.  No history of prior cardiac catheterization.  No history of seeing a cardiologist. Pulmonary: Asthma as a child,  minimal problems as an adult. Endocrine:  No diabetes. No thyroid disease. Gastrointestinal:  No history of stomach disease.  No history of liver disease.  No history of gall bladder disease.  No history of pancreas disease.  No history of colon disease. GYN: Hysterectomy for endometriosis, history of ectopic pregnancy. Urologic:  Asymptomatic right kidney stone. Musculoskeletal:  No history of joint or back disease. Hematologic:  No bleeding disorder.  No history of anemia.  Not anticoagulated. Psycho-social:  The patient is oriented.   The patient has no obvious psychologic or social impairment to understanding our conversation and plan.  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 I took our her mother's gall bladder, [name], and sister's GB, [name].  PHYSICAL EXAM: BP 104/80  Pulse 78  Temp(Src) 98.6 F (37 C) (Temporal)  Resp 16  Ht 5\' 4"  (1.626 m)  Wt 127 lb (57.607 kg)  BMI 21.80 kg/m2  General: WN thin WF who is alert and generally healthy appearing.  HEENT: Normal. Pupils equal. Good dentition. Neck: Supple. No mass.  No thyroid mass.  Carotid pulse okay with no bruit. Lymph Nodes:  No supraclavicular or cervical nodes. Lungs: Clear to auscultation and symmetric breath sounds. Heart:  RRR. No murmur or rub.  Abdomen: Soft. No mass. No tenderness. No hernia. Normal bowel sounds.  Pfannenstiel incision for hysterectomy. Rectal: Not done. Extremities:  Good strength and ROM  in upper and lower extremities. Neurologic:  Grossly intact to motor and sensory function. Psychiatric: Has normal mood and affect. Behavior is normal.   DATA REVIEWED: Notes from [name] and labs/x-rays.  Ovidio Kin, MD,  [facility] [address] [place]  [address]   [place], [place]    [address] Phone:  [phone] FAX:  [phone]

## 2011-06-27 ENCOUNTER — Telehealth: Payer: Self-pay | Admitting: Internal Medicine

## 2011-06-27 NOTE — Telephone Encounter (Signed)
I called the patient to give her a postop appt for June.  She states she left the scheduler a vm mail that she either wants her surgery moved up or for another md to do her surgery if they can't move it up with Dr Ezzard Standing.  I will give the message to Amy in scheduling.

## 2011-06-27 NOTE — Telephone Encounter (Signed)
Phoned pt who wanted to know if Dr Rhea Belton knew of a surgeon who could do her surgery earlier; she requested Dr Ezzard Standing, but he can do it until 08/09/11. Advised pt to call [facility] to see if another md can do it; pt stated understanding.

## 2011-07-07 ENCOUNTER — Telehealth (INDEPENDENT_AMBULATORY_CARE_PROVIDER_SITE_OTHER): Payer: Self-pay | Admitting: Surgery

## 2011-07-10 NOTE — Telephone Encounter (Signed)
I called the pt and moved up the appt to June 7th

## 2011-07-13 ENCOUNTER — Encounter (HOSPITAL_COMMUNITY): Payer: Self-pay

## 2011-07-13 ENCOUNTER — Ambulatory Visit (HOSPITAL_COMMUNITY): Admit: 2011-07-13 | Payer: Self-pay | Admitting: Internal Medicine

## 2011-07-13 SURGERY — EGD (ESOPHAGOGASTRODUODENOSCOPY)
Anesthesia: Monitor Anesthesia Care

## 2011-07-18 ENCOUNTER — Encounter (HOSPITAL_COMMUNITY): Payer: Self-pay | Admitting: Pharmacy Technician

## 2011-07-24 ENCOUNTER — Encounter (HOSPITAL_COMMUNITY)
Admission: RE | Admit: 2011-07-24 | Discharge: 2011-07-24 | Disposition: A | Discharge status: Home / Self Care | Payer: BC Managed Care – PPO | Source: Ambulatory Visit | Attending: Surgery | Admitting: Surgery

## 2011-07-24 ENCOUNTER — Encounter (HOSPITAL_COMMUNITY): Payer: Self-pay

## 2011-07-24 HISTORY — DX: Nausea: R11.0

## 2011-07-24 HISTORY — DX: Gastro-esophageal reflux disease without esophagitis: K21.9

## 2011-07-24 HISTORY — DX: Pure hypercholesterolemia, unspecified: E78.00

## 2011-07-24 HISTORY — DX: Sleep disorder, unspecified: G47.9

## 2011-07-24 HISTORY — DX: Acne, unspecified: L70.9

## 2011-07-24 LAB — BASIC METABOLIC PANEL
BUN: 11 mg/dL (ref 6–23)
Chloride: 102 mEq/L (ref 96–112)
GFR calc Af Amer: >90 mL/min (ref >90)
GFR calc non Af Amer: >90 mL/min (ref >90)
Potassium: 4 mEq/L (ref 3.5–5.1)
Sodium: 136 mEq/L (ref 135–145)

## 2011-07-24 LAB — CBC
HCT: 40.2 % (ref 36.0–46.0)
MCHC: 34.1 g/dL (ref 30.0–36.0)
Platelets: 267 10*3/uL (ref 150–400)
RDW: 12.4 % (ref 11.5–15.5)
WBC: 5 10*3/uL (ref 4.0–10.5)

## 2011-07-24 LAB — SURGICAL PCR SCREEN
MRSA, PCR: NEGATIVE
Staphylococcus aureus: NEGATIVE

## 2011-07-24 MED ORDER — CHLORHEXIDINE GLUCONATE 4 % EX LIQD
1.0000 "application " | Freq: Once | CUTANEOUS | Status: DC
  Filled 2011-07-24: qty 15

## 2011-07-24 NOTE — Patient Instructions (Signed)
20 [name]  07/24/2011   Your procedure is scheduled on:  07/27/11  Report to SHORT STAY DEPT  at 8:30 AM.  Call this number if you have problems the morning of surgery: [phone]   Remember:   Do not eat food or drink liquids AFTER MIDNIGHT    Take these medicines the morning of surgery with A SIP OF WATER: PROTONIX / MUCINEX IF NEEDED   Do not wear jewelry, make-up or nail polish.  Do not wear lotions, powders, or perfumes.   Do not shave legs or underarms 48 hrs. before surgery (men may shave face)  Do not bring valuables to the hospital.  Contacts, dentures or bridgework may not be worn into surgery.  Leave suitcase in the car. After surgery it may be brought to your room.  For patients admitted to the hospital, checkout time is 11:00 AM the day of discharge.   Patients discharged the day of surgery will not be allowed to drive home.    Special Instructions:   Please read over the following fact sheets that you were given: MRSA  Information               SHOWER WITH BETASEPT THE NIGHT BEFORE SURGERY AND THE MORNING OF SURGERY

## 2011-07-27 ENCOUNTER — Encounter (HOSPITAL_COMMUNITY): Payer: Self-pay | Admitting: Anesthesiology

## 2011-07-27 ENCOUNTER — Ambulatory Visit (HOSPITAL_COMMUNITY): Payer: BC Managed Care – PPO | Admitting: Anesthesiology

## 2011-07-27 ENCOUNTER — Encounter (HOSPITAL_COMMUNITY): Payer: Self-pay | Admitting: *Deleted

## 2011-07-27 ENCOUNTER — Ambulatory Visit (HOSPITAL_COMMUNITY)
Admission: RE | Admit: 2011-07-27 | Discharge: 2011-07-27 | Disposition: A | Discharge status: Home / Self Care | Payer: BC Managed Care – PPO | Source: Ambulatory Visit | Attending: Surgery | Admitting: Surgery

## 2011-07-27 ENCOUNTER — Ambulatory Visit (INDEPENDENT_AMBULATORY_CARE_PROVIDER_SITE_OTHER): Payer: BC Managed Care – PPO | Admitting: Surgery

## 2011-07-27 ENCOUNTER — Ambulatory Visit (HOSPITAL_COMMUNITY): Payer: BC Managed Care – PPO

## 2011-07-27 ENCOUNTER — Encounter (HOSPITAL_COMMUNITY)
Admission: RE | Disposition: A | Discharge status: Home / Self Care | Payer: Self-pay | Source: Ambulatory Visit | Attending: Surgery

## 2011-07-27 DIAGNOSIS — E785 Hyperlipidemia, unspecified: Secondary | ICD-10-CM | POA: Insufficient documentation

## 2011-07-27 DIAGNOSIS — Z79899 Other long term (current) drug therapy: Secondary | ICD-10-CM | POA: Insufficient documentation

## 2011-07-27 DIAGNOSIS — K811 Chronic cholecystitis: Secondary | ICD-10-CM

## 2011-07-27 DIAGNOSIS — Z01812 Encounter for preprocedural laboratory examination: Secondary | ICD-10-CM | POA: Insufficient documentation

## 2011-07-27 DIAGNOSIS — Z7982 Long term (current) use of aspirin: Secondary | ICD-10-CM | POA: Insufficient documentation

## 2011-07-27 DIAGNOSIS — K829 Disease of gallbladder, unspecified: Secondary | ICD-10-CM

## 2011-07-27 HISTORY — PX: CHOLECYSTECTOMY: SHX55

## 2011-07-27 SURGERY — LAPAROSCOPIC CHOLECYSTECTOMY WITH INTRAOPERATIVE CHOLANGIOGRAM
Anesthesia: General | Wound class: Clean Contaminated

## 2011-07-27 MED ORDER — HYDROCODONE-ACETAMINOPHEN 5-325 MG PO TABS: 1.0000 | ORAL_TABLET | Freq: Four times a day (QID) | ORAL | Status: DC | PRN

## 2011-07-27 MED ORDER — FENTANYL CITRATE 0.05 MG/ML IJ SOLN
INTRAMUSCULAR | Status: AC
  Filled 2011-07-27: qty 2

## 2011-07-27 MED ORDER — PROPOFOL 10 MG/ML IV BOLUS
INTRAVENOUS | Status: DC | PRN
  Administered 2011-07-27: 120 mg via INTRAVENOUS

## 2011-07-27 MED ORDER — DIATRIZOATE MEGLUMINE 30 % UR SOLN
URETHRAL | Status: DC | PRN
  Administered 2011-07-27: 3 mL

## 2011-07-27 MED ORDER — IOHEXOL 300 MG/ML  SOLN
INTRAMUSCULAR | Status: AC
  Filled 2011-07-27: qty 1

## 2011-07-27 MED ORDER — LACTATED RINGERS IV SOLN
INTRAVENOUS | Status: DC
  Administered 2011-07-27: 1000 mL via INTRAVENOUS
  Administered 2011-07-27: 12:00:00 via INTRAVENOUS

## 2011-07-27 MED ORDER — KETAMINE HCL 50 MG/ML IJ SOLN
INTRAMUSCULAR | Status: DC | PRN
  Administered 2011-07-27 (×3): 10 mg via INTRAMUSCULAR

## 2011-07-27 MED ORDER — METOCLOPRAMIDE HCL 5 MG/ML IJ SOLN
INTRAMUSCULAR | Status: DC | PRN
  Administered 2011-07-27: 10 mg via INTRAVENOUS

## 2011-07-27 MED ORDER — FENTANYL CITRATE 0.05 MG/ML IJ SOLN
INTRAMUSCULAR | Status: DC | PRN
  Administered 2011-07-27 (×2): 50 ug via INTRAVENOUS

## 2011-07-27 MED ORDER — MIDAZOLAM HCL 5 MG/5ML IJ SOLN
INTRAMUSCULAR | Status: DC | PRN
  Administered 2011-07-27: 2 mg via INTRAVENOUS

## 2011-07-27 MED ORDER — NEOSTIGMINE METHYLSULFATE 1 MG/ML IJ SOLN
INTRAMUSCULAR | Status: DC | PRN
  Administered 2011-07-27: 3 mg via INTRAVENOUS

## 2011-07-27 MED ORDER — HYDROMORPHONE HCL PF 1 MG/ML IJ SOLN
INTRAMUSCULAR | Status: DC | PRN
  Administered 2011-07-27 (×2): 0.5 mg via INTRAVENOUS

## 2011-07-27 MED ORDER — DEXAMETHASONE SODIUM PHOSPHATE 10 MG/ML IJ SOLN
INTRAMUSCULAR | Status: DC | PRN
  Administered 2011-07-27: 10 mg via INTRAVENOUS

## 2011-07-27 MED ORDER — BUPIVACAINE HCL (PF) 0.25 % IJ SOLN
INTRAMUSCULAR | Status: AC
  Filled 2011-07-27: qty 30

## 2011-07-27 MED ORDER — ONDANSETRON HCL 4 MG/2ML IJ SOLN
INTRAMUSCULAR | Status: DC | PRN
  Administered 2011-07-27: 4 mg via INTRAVENOUS

## 2011-07-27 MED ORDER — LACTATED RINGERS IV SOLN: INTRAVENOUS | Status: DC

## 2011-07-27 MED ORDER — HYDROCODONE-ACETAMINOPHEN 5-325 MG PO TABS
ORAL_TABLET | ORAL | Status: AC
  Filled 2011-07-27: qty 1

## 2011-07-27 MED ORDER — CEFAZOLIN SODIUM 1-5 GM-% IV SOLN
1.0000 g | INTRAVENOUS | Status: AC
  Administered 2011-07-27: 1 g via INTRAVENOUS

## 2011-07-27 MED ORDER — ROCURONIUM BROMIDE 100 MG/10ML IV SOLN
INTRAVENOUS | Status: DC | PRN
  Administered 2011-07-27: 30 mg via INTRAVENOUS

## 2011-07-27 MED ORDER — GLYCOPYRROLATE 0.2 MG/ML IJ SOLN
INTRAMUSCULAR | Status: DC | PRN
  Administered 2011-07-27: 0.3 mg via INTRAVENOUS

## 2011-07-27 MED ORDER — LIDOCAINE HCL (CARDIAC) 20 MG/ML IV SOLN
INTRAVENOUS | Status: DC | PRN
  Administered 2011-07-27: 20 mg via INTRAVENOUS
  Administered 2011-07-27: 30 mg via INTRAVENOUS

## 2011-07-27 MED ORDER — FENTANYL CITRATE 0.05 MG/ML IJ SOLN
25.0000 ug | INTRAMUSCULAR | Status: DC | PRN
  Administered 2011-07-27 (×2): 25 ug via INTRAVENOUS
  Administered 2011-07-27: 50 ug via INTRAVENOUS

## 2011-07-27 MED ORDER — ESMOLOL HCL 10 MG/ML IV SOLN
INTRAVENOUS | Status: DC | PRN
  Administered 2011-07-27: 10 mg via INTRAVENOUS

## 2011-07-27 MED ORDER — ACETAMINOPHEN 10 MG/ML IV SOLN
INTRAVENOUS | Status: DC | PRN
  Administered 2011-07-27: 1000 mg via INTRAVENOUS

## 2011-07-27 MED ORDER — BUPIVACAINE HCL (PF) 0.25 % IJ SOLN
INTRAMUSCULAR | Status: DC | PRN
  Administered 2011-07-27: 25 mL

## 2011-07-27 MED ORDER — DROPERIDOL 2.5 MG/ML IJ SOLN
INTRAMUSCULAR | Status: DC | PRN
  Administered 2011-07-27: 0.625 mg via INTRAVENOUS

## 2011-07-27 MED ORDER — HYDROCODONE-ACETAMINOPHEN 5-325 MG PO TABS
1.0000 | ORAL_TABLET | Freq: Once | ORAL | Status: AC
  Administered 2011-07-27: 1 via ORAL

## 2011-07-27 MED ORDER — LACTATED RINGERS IR SOLN
Status: DC | PRN
  Administered 2011-07-27: 1000 mL

## 2011-07-27 MED ORDER — CEFAZOLIN SODIUM 1-5 GM-% IV SOLN
INTRAVENOUS | Status: AC
  Filled 2011-07-27: qty 50

## 2011-07-27 MED ORDER — ACETAMINOPHEN 10 MG/ML IV SOLN
INTRAVENOUS | Status: AC
  Filled 2011-07-27: qty 100

## 2011-07-27 SURGICAL SUPPLY — 44 items
ADH SKN CLS APL DERMABOND .7 (GAUZE/BANDAGES/DRESSINGS)
APL SKNCLS STERI-STRIP NONHPOA (GAUZE/BANDAGES/DRESSINGS) ×1
APPLIER CLIP ROT 10 11.4 M/L (STAPLE) ×2
APR CLP MED LRG 11.4X10 (STAPLE) ×1
BAG SPEC RTRVL LRG 6X4 10 (ENDOMECHANICALS) ×1
BENZOIN TINCTURE PRP APPL 2/3 (GAUZE/BANDAGES/DRESSINGS) ×2 IMPLANT
CANISTER SUCTION 2500CC (MISCELLANEOUS) ×2 IMPLANT
CHLORAPREP W/TINT 26ML (MISCELLANEOUS) ×2 IMPLANT
CHOLANGIOGRAM CATH TAUT (CATHETERS) ×2 IMPLANT
CLIP APPLIE ROT 10 11.4 M/L (STAPLE) ×1 IMPLANT
CLOTH BEACON ORANGE TIMEOUT ST (SAFETY) ×2 IMPLANT
COVER MAYO STAND STRL (DRAPES) ×1 IMPLANT
DECANTER SPIKE VIAL GLASS SM (MISCELLANEOUS) ×1 IMPLANT
DERMABOND ADVANCED (GAUZE/BANDAGES/DRESSINGS)
DERMABOND ADVANCED .7 DNX12 (GAUZE/BANDAGES/DRESSINGS) IMPLANT
DRAPE C-ARM 42X72 X-RAY (DRAPES) ×1 IMPLANT
DRAPE LAPAROSCOPIC ABDOMINAL (DRAPES) ×2 IMPLANT
ELECT REM PT RETURN 9FT ADLT (ELECTROSURGICAL) ×2
ELECTRODE REM PT RTRN 9FT ADLT (ELECTROSURGICAL) ×1 IMPLANT
GLOVE BIOGEL PI IND STRL 7.0 (GLOVE) ×1 IMPLANT
GLOVE BIOGEL PI INDICATOR 7.0 (GLOVE) ×1
GLOVE SURG SIGNA 7.5 PF LTX (GLOVE) ×2 IMPLANT
GOWN STRL NON-REIN LRG LVL3 (GOWN DISPOSABLE) ×2 IMPLANT
GOWN STRL REIN XL XLG (GOWN DISPOSABLE) ×7 IMPLANT
HEMOSTAT SURGICEL 4X8 (HEMOSTASIS) IMPLANT
IV CATH 14GX2 1/4 (CATHETERS) ×2 IMPLANT
IV SET EXT 30 76VOL 4 MALE LL (IV SETS) ×1 IMPLANT
KIT BASIN OR (CUSTOM PROCEDURE TRAY) ×2 IMPLANT
NS IRRIG 1000ML POUR BTL (IV SOLUTION) ×1 IMPLANT
POUCH SPECIMEN RETRIEVAL 10MM (ENDOMECHANICALS) ×1 IMPLANT
SET IRRIG TUBING LAPAROSCOPIC (IRRIGATION / IRRIGATOR) ×2 IMPLANT
SLEEVE Z-THREAD 5X100MM (TROCAR) IMPLANT
SOLUTION ANTI FOG 6CC (MISCELLANEOUS) ×2 IMPLANT
STOPCOCK K 69 2C6206 (IV SETS) ×2 IMPLANT
STRIP CLOSURE SKIN 1/4X4 (GAUZE/BANDAGES/DRESSINGS) ×2 IMPLANT
SUT VIC AB 5-0 PS2 18 (SUTURE) ×1 IMPLANT
TOWEL OR 17X26 10 PK STRL BLUE (TOWEL DISPOSABLE) ×4 IMPLANT
TRAY LAP CHOLE (CUSTOM PROCEDURE TRAY) ×2 IMPLANT
TROCAR XCEL BLUNT TIP 100MML (ENDOMECHANICALS) ×2 IMPLANT
TROCAR Z-THREAD FIOS 11X100 BL (TROCAR) ×2 IMPLANT
TROCAR Z-THREAD FIOS 5X100MM (TROCAR) ×2 IMPLANT
TROCAR Z-THREAD SLEEVE 11X100 (TROCAR) ×2 IMPLANT
TUBING INSUFFLATION 10FT LAP (TUBING) ×2 IMPLANT
WATER STERILE IRR 1500ML POUR (IV SOLUTION) ×1 IMPLANT

## 2011-07-27 NOTE — Anesthesia Postprocedure Evaluation (Signed)
  Anesthesia Post-op Note  Patient: [name]  Procedure(s) Performed: Procedure(s) (LRB): LAPAROSCOPIC CHOLECYSTECTOMY WITH INTRAOPERATIVE CHOLANGIOGRAM (N/A)  Patient Location: PACU  Anesthesia Type: General  Level of Consciousness: awake and alert   Airway and Oxygen Therapy: Patient Spontanous Breathing  Post-op Pain: mild  Post-op Assessment: Post-op Vital signs reviewed, Patient's Cardiovascular Status Stable, Respiratory Function Stable, Patent Airway and No signs of Nausea or vomiting  Post-op Vital Signs: stable  Complications: No apparent anesthesia complications

## 2011-07-27 NOTE — H&P (Signed)
Re: [name]  DOB: Aug 04, 1970  MRN: [id number]   ASSESSMENT AND PLAN:  1. Biliary dyskinesia   I discussed with the patient the indications and risks of gall bladder surgery. The primary risks of gall bladder surgery include, but are not limited to, bleeding, infection, common bile duct injury, and open surgery. There is also the risk that the patient may have continued symptoms after surgery. However, the likelihood of improvement in symptoms and return to the patient's normal status is good. We discussed the typical post-operative recovery course. I tried to answer the patient's questions.   I gave the patient literature about gall bladder surgery.   Her parents and husband are here today.  2. Asthma, minimal  3. Hyperlipidemia.  4 Asymptomatic right kidney stone.   Chief Complaint   Patient presents with   .  Abdominal Pain   .  Nausea   REFERRING PHYSICIAN: Nadean Corwin, MD, MD   HISTORY OF PRESENT ILLNESS:   [name] is a 41 y.o. (DOB: August 16, 1970) white female whose primary care physician is Nadean Corwin, MD, MD and comes to me today for gall bladder disease.   This began with a couple episodes of right chest and back pain. It came after she was on the treadmill. She had belching, it hurt to take a deep breath, and she had some nausea after eating.   Because an US showed a right kidney stone, she saw Jetta Lout at the urology office. She did a CT scan, saw the stone and a cyst on her ovary, but no other explanation of the pain. She was referred to Dr. Chestine Spore, who did an upper endoscopy, which was basically negatve. She was referred to me with biliary dyskinesia, based on her HIDA scan. She's also had two more attacks since this all began, but one was after pizza. She does see some correlation between fatty food and the attacks. She's has some symptom improvement with Protonix given by Dr. Rhea Belton.   HIDA scan - 06/05/2011 - 20% EF of GB  US of abd -  05/30/2011 - possible 6 mm right upper pole kidney stone, no gall bladder disease.  Upper endo - Dr. Shela Commons. Pyrtle - 06/13/2011 - Negative.  I took care of both her mother and her sister with GB disease.   Past Medical History   Diagnosis  Date   .  Asthma    .  IBS (irritable bowel syndrome)    .  HLD (hyperlipidemia)     Past Surgical History   Procedure  Date   .  Ectopic pregnancy surgery  2005   .  Partial hysterectomy  2009   .  Esophagogastroduodenoscopy  06/13/2011     Procedure: ESOPHAGOGASTRODUODENOSCOPY (EGD); Surgeon: Beverley Fiedler, MD; Location: [facility]; Service: Gastroenterology; Laterality: N/A;    Current Outpatient Prescriptions   Medication  Sig  Dispense  Refill   .  aspirin 81 MG tablet  Take 81 mg by mouth daily.     .  Cholecalciferol (VITAMIN D3) 5000 UNITS CAPS  Take 1 capsule by mouth. Three times weekly     .  gemfibrozil (LOPID) 600 MG tablet  Take 600 mg by mouth 2 (two) times daily.     Marland Kitchen  guaiFENesin (MUCINEX) 600 MG 12 hr tablet  Take 1,200 mg by mouth 2 (two) times daily.     Marland Kitchen  HYDROcodone-acetaminophen (NORCO) 7.5-325 MG per tablet  Take 1 tablet by mouth as needed.     Marland Kitchen  lubiprostone (AMITIZA) 8 MCG capsule  Take 8 mcg by mouth 2 (two) times daily.     .  meloxicam (MOBIC) 15 MG tablet  Take 15 mg by mouth daily.     .  minocycline (MINOCIN,DYNACIN) 100 MG capsule  Take 100 mg by mouth daily.     .  pantoprazole (PROTONIX) 40 MG tablet  Take 1 tablet (40 mg total) by mouth daily.  30 tablet  1   .  vitamin E 1000 UNIT capsule  Take 1,000 Units by mouth 2 (two) times daily.     No Known Allergies   REVIEW OF SYSTEMS:  Skin: No history of rash. No history of abnormal moles.  Infection: No history of hepatitis or HIV. No history of MRSA.  Neurologic: No history of stroke. No history of seizure. No history of headaches.  Cardiac: No history of hypertension. No history of heart disease. No history of prior cardiac catheterization. No history of seeing a  cardiologist.  Pulmonary: Asthma as a child, minimal problems as an adult.  Endocrine: No diabetes. No thyroid disease.  Gastrointestinal: No history of stomach disease. No history of liver disease. No history of gall bladder disease. No history of pancreas disease. No history of colon disease.  GYN: Hysterectomy for endometriosis, history of ectopic pregnancy.  Urologic: Asymptomatic right kidney stone.  Musculoskeletal: No history of joint or back disease.  Hematologic: No bleeding disorder. No history of anemia. Not anticoagulated.  Psycho-social: The patient is oriented. The patient has no obvious psychologic or social impairment to understanding our conversation and plan.   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  I took our her mother's gall bladder, Loma Sender, and sister's GB, Manuella Ghazi.   PHYSICAL EXAM:  BP 126/88  Pulse 77  Temp(Src) 98.3 F (36.8 C) (Oral)  Resp 18  SpO2 100%  General: WN thin WF who is alert and generally healthy appearing.  HEENT: Normal. Pupils equal. Good dentition.  Neck: Supple. No mass. No thyroid mass. Carotid pulse okay with no bruit.  Lymph Nodes: No supraclavicular or cervical nodes.  Lungs: Clear to auscultation and symmetric breath sounds.  Heart: RRR. No murmur or rub.  Abdomen: Soft. No mass. No tenderness. No hernia. Normal bowel sounds. Pfannenstiel incision for hysterectomy.  Rectal: Not done.  Extremities: Good strength and ROM in upper and lower extremities.  Neurologic: Grossly intact to motor and sensory function.  Psychiatric: Has normal mood and affect. Behavior is normal.   DATA REVIEWED:  Notes from Dr. Rhea Belton and labs/x-rays.   Ovidio Kin, MD, [facility]  [address] [place]  [place], [place] [address]  Phone: [phone] FAX: [phone]

## 2011-07-27 NOTE — Anesthesia Preprocedure Evaluation (Addendum)
Anesthesia Evaluation  Patient identified by MRN, date of birth, ID band Patient awake    Reviewed: Allergy & Precautions, H&P , NPO status , Patient's Chart, lab work & pertinent test results  Airway Mallampati: II TM Distance: >3 FB Neck ROM: full    Dental No notable dental hx. (+) Teeth Intact and Dental Advisory Given   Pulmonary asthma ,  Asthma mild and only with URTI breath sounds clear to auscultation  Pulmonary exam normal       Cardiovascular Exercise Tolerance: Good negative cardio ROS  Rhythm:regular Rate:Normal     Neuro/Psych negative neurological ROS  negative psych ROS   GI/Hepatic negative GI ROS, Neg liver ROS, GERD-  Medicated and Controlled,  Endo/Other  negative endocrine ROS  Renal/GU negative Renal ROS  negative genitourinary   Musculoskeletal   Abdominal   Peds  Hematology negative hematology ROS (+)   Anesthesia Other Findings   Reproductive/Obstetrics negative OB ROS                          Anesthesia Physical Anesthesia Plan  ASA: II  Anesthesia Plan: General   Post-op Pain Management:    Induction: Intravenous  Airway Management Planned: Oral ETT  Additional Equipment:   Intra-op Plan:   Post-operative Plan: Extubation in OR  Informed Consent: I have reviewed the patients History and Physical, chart, labs and discussed the procedure including the risks, benefits and alternatives for the proposed anesthesia with the patient or authorized representative who has indicated his/her understanding and acceptance.   Dental Advisory Given  Plan Discussed with: CRNA and Surgeon  Anesthesia Plan Comments:         Anesthesia Quick Evaluation

## 2011-07-27 NOTE — Brief Op Note (Signed)
07/27/2011  12:14 PM  PATIENT:  [name], 41 y.o., female, MRN: [id number]  PREOP DIAGNOSIS:  biliary dyskinesia  POSTOP DIAGNOSIS:   Chronic cholecystitis  PROCEDURE:   Procedure(s): LAPAROSCOPIC CHOLECYSTECTOMY WITH INTRAOPERATIVE CHOLANGIOGRAM  SURGEON:   Ovidio Kin, M.D.  ASSISTANT:   Barrie Dunker, MD  ANESTHESIA:   general  Gaetano Hawthorne, MD - Anesthesiologist Randon Goldsmith - CRNA  General  EBL:  minimal  ml  BLOOD ADMINISTERED: none  DRAINS: none   LOCAL MEDICATIONS USED:   25 cc 1/4% marcaine  SPECIMEN:   Gal bladder  COUNTS CORRECT:  YES  INDICATIONS FOR PROCEDURE:  [name] is a 41 y.o. (DOB: 09-Jun-1970) white female whose primary care physician is Nadean Corwin, MD, MD and comes for gall bladder surgery.   The indications and risks of the surgery were explained to the patient.  The risks include, but are not limited to, infection, bleeding, and nerve injury.  Note dictated to:   #[id number]

## 2011-07-27 NOTE — Discharge Instructions (Signed)
[facility] - DISCHARGE INSTRUCTIONS TO PATIENT  Return to work on:  08/14/2011  Activity:  Driving - May drive in 2 or 3 days of doing well   Lifting - No heavy lifting (> 15 pounds ), for one week.  Wound Care:   May shower in 2 days.  Diet:  As tolerated  Follow up appointment:  Call Dr. Allene Pyo office [facility]) at [phone] for an appointment in 2 to 3 weeks.  Medications and dosages:  Resume your home medications.  You have a prescription for:  Vicodin.  Call Dr. Ezzard Standing or his office  [phone]) if you have:  Temperature greater than 100.4,  Persistent nausea and vomiting,  Severe uncontrolled pain,  Redness, tenderness, or signs of infection (pain, swelling, redness, odor or green/yellow discharge around the site),  Difficulty breathing, headache or visual disturbances,  Any other questions or concerns you may have after discharge.  In an emergency, call 911 or go to an Emergency Department at a nearby hospital.

## 2011-07-27 NOTE — Anesthesia Procedure Notes (Signed)
Procedure Name: Intubation Date/Time: 07/27/2011 11:22 AM Performed by: Randon Goldsmith CATHERINE PAYNE Pre-anesthesia Checklist: Patient identified, Emergency Drugs available, Suction available and Patient being monitored Patient Re-evaluated:Patient Re-evaluated prior to inductionOxygen Delivery Method: Circle system utilized Preoxygenation: Pre-oxygenation with 100% oxygen Intubation Type: IV induction Ventilation: Mask ventilation without difficulty Laryngoscope Size: Miller and 2 Grade View: Grade I Tube type: Oral Tube size: 7.5 mm Number of attempts: 1 Airway Equipment and Method: Stylet Placement Confirmation: ETT inserted through vocal cords under direct vision,  positive ETCO2,  CO2 detector and breath sounds checked- equal and bilateral Secured at: 20 cm Tube secured with: Tape Dental Injury: Teeth and Oropharynx as per pre-operative assessment

## 2011-07-27 NOTE — Preoperative (Signed)
Beta Blockers   Reason not to administer Beta Blockers:Not Applicable, not on home BB 

## 2011-07-27 NOTE — Transfer of Care (Signed)
Immediate Anesthesia Transfer of Care Note  Patient: [name]  Procedure(s) Performed: Procedure(s) (LRB): LAPAROSCOPIC CHOLECYSTECTOMY WITH INTRAOPERATIVE CHOLANGIOGRAM (N/A)  Patient Location: PACU  Anesthesia Type: General  Level of Consciousness: awake and patient cooperative  Airway & Oxygen Therapy: Patient Spontanous Breathing and Patient connected to face mask  Post-op Assessment: Report given to PACU RN, Post -op Vital signs reviewed and stable and Patient moving all extremities  Post vital signs: Reviewed and stable  Complications: No apparent anesthesia complications

## 2011-07-28 ENCOUNTER — Encounter (HOSPITAL_COMMUNITY): Payer: Self-pay | Admitting: Surgery

## 2011-07-29 NOTE — Op Note (Signed)
NAMEESSENCE, MERLE                 ACCOUNT NO.:  [id number]  MEDICAL RECORD NO.:  [id number]  LOCATION:  [facility]                         FACILITY:  [facility]  PHYSICIAN:  Sandria Bales. Ezzard Standing, M.D.  DATE OF BIRTH:  07/30/1970  DATE OF PROCEDURE:  07/27/2011                              OPERATIVE REPORT  PREOPERATIVE DIAGNOSIS:  Biliary dyskinesia.  POSTOPERATIVE DIAGNOSIS:  Biliary dyskinesia with chronic cholecystitis.  PROCEDURE:  Laparoscopic cholecystectomy with intraoperative cholangiogram.  SURGEON:  Sandria Bales. Ezzard Standing, M.D.  FIRST ASSISTANT:  Abigail Miyamoto, M.D.  ANESTHESIA:  General.  Local anesthetic was 25 cc of 0.25% Marcaine.  ESTIMATED BLOOD LOSS:  Minimal.  INDICATION FOR PROCEDURE:  Ms. Maniscalco is a 41 year old white female, who sees Dr. Lucky Cowboy as her primary medical doctor.  She has had several episodes of both chest and back pain.  She also has a right kidney stone. However, she was evaluated by Dr. Rhea Belton and was found to have a normal ultrasound, but had an abnormal HIDA scan consistent with biliary dyskinesia.  I discussed with the patient about proceeding with gallbladder surgery. The indication and risks of the surgery were explained to the patient.  The risks include, but are not limited to, bleeding, infection, common bile duct injury, and open surgery.  OPERATIVE NOTE:  The patient was placed in a supine position in room #1. General anesthesia was supervised by Dr. Ronelle Nigh.  A time-out was held and surgical checklist run.  Her abdomen was prepped with ChloraPrep, sterilely draped.  An infraumbilical incision was made with sharp dissection carried down through the abdominal cavity.  A 0 degree 10 mm laparoscope was inserted through a 12 mm Hasson trocar.  The Hasson trocar was secured with a 0 Vicryl suture.  Right and left lobes of the liver were unremarkable. The stomach was unremarkable.  The bowel that I could see was unremarkable.  The  bladder was full.  I then placed 3 additional trocars, a 5 mm in the subxiphoid location, a 5 mm in the right mid subcostal, and 5 mm in the right lateral subcostal location.  The gallbladder was grasped, rotated cephalad.  It did have some adhesions about mid body that went to the duodenum, into the fat. This will be consistent with early chronic cholecystitis.  I dissected out the cystic duct.  An intraoperative cholangiogram was obtained.  The intraoperative cholangiogram was obtained using a cutoff taut catheter inserted through a 14-gauge Jelco into the abdominal cavity. The taut catheter was inserted to the side of the cut cystic duct, secured with an Endoclip and cholangiogram shot under fluoroscopy.  I used about 10 cc of half-strength Hypaque solution and injected this, which showed rapid flow of contrast down the cystic duct into the common bile duct, up the hepatic radicals, down the common bile duct and into the duodenum.  There was no filling defect, no leak and this was felt to be a normal intraoperative cholangiogram.  The taut catheter was then removed.  The cystic duct triply Endo clipped.  I found both an anterior and posterior branch of the cystic artery, which was doubly Endo clipped.  I then sharply  and bluntly dissected the gallbladder from the gallbladder bed.  Prior to complete division of the gallbladder from the gallbladder bed, I revisualized the triangle of Calot, I revisualized the gallbladder bed, there was no bleeding or bile leak from either one.  The gallbladder was then placed in an EndoCatch bag and delivered through the umbilicus.  The umbilical port was closed with a 0 Vicryl suture.  I infiltrated about 25 cc of 0.25% Marcaine in each trocar site.  I irrigated the gallbladder bed and liver with about 300 - 400 cc of saline and aspirated this fluid out.  The trocars were removed in turn.  The skin was then closed with a 4-0 Monocryl suture and  painted with Dermabond.  The patient tolerated the procedure well, was transported to recovery room in good condition.   Sandria Bales. Ezzard Standing, M.D.   DHN/MEDQ  D:  07/27/2011  T:  07/28/2011  Job:  [id number]  cc:   Erick Blinks, MD Fax: [phone]  Lucky Cowboy, M.D. Fax: [phone]

## 2011-08-06 ENCOUNTER — Emergency Department (HOSPITAL_COMMUNITY): Payer: BC Managed Care – PPO

## 2011-08-06 ENCOUNTER — Inpatient Hospital Stay (HOSPITAL_COMMUNITY)
Admission: EM | Admit: 2011-08-06 | Discharge: 2011-08-13 | DRG: 151 | Disposition: A | Discharge status: Home / Self Care | Payer: BC Managed Care – PPO | Attending: General Surgery | Admitting: General Surgery

## 2011-08-06 ENCOUNTER — Telehealth (INDEPENDENT_AMBULATORY_CARE_PROVIDER_SITE_OTHER): Payer: Self-pay | Admitting: General Surgery

## 2011-08-06 ENCOUNTER — Encounter (HOSPITAL_COMMUNITY): Payer: Self-pay | Admitting: *Deleted

## 2011-08-06 DIAGNOSIS — K219 Gastro-esophageal reflux disease without esophagitis: Secondary | ICD-10-CM | POA: Diagnosis present

## 2011-08-06 DIAGNOSIS — N83209 Unspecified ovarian cyst, unspecified side: Secondary | ICD-10-CM | POA: Diagnosis present

## 2011-08-06 DIAGNOSIS — K56609 Unspecified intestinal obstruction, unspecified as to partial versus complete obstruction: Secondary | ICD-10-CM

## 2011-08-06 DIAGNOSIS — Z9089 Acquired absence of other organs: Secondary | ICD-10-CM

## 2011-08-06 DIAGNOSIS — J45909 Unspecified asthma, uncomplicated: Secondary | ICD-10-CM | POA: Diagnosis present

## 2011-08-06 DIAGNOSIS — K565 Intestinal adhesions [bands], unspecified as to partial versus complete obstruction: Principal | ICD-10-CM | POA: Diagnosis present

## 2011-08-06 HISTORY — DX: Unspecified osteoarthritis, unspecified site: M19.90

## 2011-08-06 HISTORY — DX: Chronic kidney disease, unspecified: N18.9

## 2011-08-06 LAB — COMPREHENSIVE METABOLIC PANEL
ALT: 26 U/L (ref 0–35)
Alkaline Phosphatase: 88 U/L (ref 39–117)
BUN: 15 mg/dL (ref 6–23)
CO2: 26 mEq/L (ref 19–32)
Chloride: 96 mEq/L (ref 96–112)
GFR calc Af Amer: >90 mL/min (ref >90)
Glucose, Bld: 171 mg/dL — ABNORMAL HIGH (ref 70–99)
Potassium: 3.5 mEq/L (ref 3.5–5.1)
Sodium: 134 mEq/L — ABNORMAL LOW (ref 135–145)
Total Bilirubin: 0.6 mg/dL (ref 0.3–1.2)
Total Protein: 8 g/dL (ref 6.0–8.3)

## 2011-08-06 LAB — CBC
Hemoglobin: 15.6 g/dL — ABNORMAL HIGH (ref 12.0–15.0)
Platelets: 260 10*3/uL (ref 150–400)
RBC: 5.38 MIL/uL — ABNORMAL HIGH (ref 3.87–5.11)
WBC: 16.6 10*3/uL — ABNORMAL HIGH (ref 4.0–10.5)

## 2011-08-06 LAB — DIFFERENTIAL
Eosinophils Absolute: 0 10*3/uL (ref 0.0–0.7)
Lymphocytes Relative: 5 % — ABNORMAL LOW (ref 12–46)
Lymphs Abs: 0.8 10*3/uL (ref 0.7–4.0)
Monocytes Relative: 3 % (ref 3–12)
Neutro Abs: 15.3 10*3/uL — ABNORMAL HIGH (ref 1.7–7.7)
Neutrophils Relative %: 92 % — ABNORMAL HIGH (ref 43–77)

## 2011-08-06 MED ORDER — SODIUM CHLORIDE 0.9 % IV SOLN
Freq: Once | INTRAVENOUS | Status: AC
  Administered 2011-08-06: 20 mL via INTRAVENOUS

## 2011-08-06 MED ORDER — HYDROMORPHONE HCL PF 1 MG/ML IJ SOLN
0.5000 mg | Freq: Once | INTRAMUSCULAR | Status: AC
  Administered 2011-08-06: 0.5 mg via INTRAVENOUS
  Filled 2011-08-06: qty 1

## 2011-08-06 MED ORDER — IOHEXOL 300 MG/ML  SOLN
100.0000 mL | Freq: Once | INTRAMUSCULAR | Status: AC | PRN
  Administered 2011-08-06: 100 mL via INTRAVENOUS

## 2011-08-06 MED ORDER — ONDANSETRON HCL 4 MG/2ML IJ SOLN
4.0000 mg | Freq: Once | INTRAMUSCULAR | Status: AC
  Administered 2011-08-06: 4 mg via INTRAVENOUS
  Filled 2011-08-06: qty 2

## 2011-08-06 NOTE — ED Notes (Signed)
Pt from home with reports of cholecystectomy on 5/16 and was feeling okay until this morning when pt began to have epigastric pain, loose stool and nausea that has worsened throughout the day. Abdomen noted to be slightly distended.

## 2011-08-06 NOTE — ED Notes (Addendum)
Pt reports abdominal pain in her epigastric area that radiates to her lower back.  She has some nausea and diarrhea but reports no vomiting.  Pt has recently had her gall bladder removed and incision sites do not exhibit signs of infection.  She does have a bruise over her umbilicus area.  She reports feeling like it's "harder to breath" but does not appear to be in any respiratory distress at this time. Lung sounds clear.

## 2011-08-06 NOTE — ED Notes (Signed)
MD at bedside. 

## 2011-08-06 NOTE — ED Provider Notes (Signed)
  Physical Exam  BP 116/73  Pulse 77  Temp(Src) 97.5 F (36.4 C) (Oral)  Resp 16  SpO2 99%  Physical Exam  ED Course  Procedures  MDM Medical screening examination/treatment/procedure(s) were conducted as a shared visit with non-physician practitioner(s) and myself.  I personally evaluated the patient during the encounter. Abdominal pain that is new from upper abdomen to lower abdomen. Nausea without vomiting, but patient states she doesn't vomit. She's had decreased bowel output also. She had surgery on the 16th for laparoscopic cholecystectomy. CT scan shows early small bowel obstruction. General surgery has been consulted      Juliet Rude. Rubin Payor, MD 08/06/11 1610

## 2011-08-06 NOTE — ED Provider Notes (Signed)
History     CSN: [id number]  Arrival date & time 08/06/11  1640   First MD Initiated Contact with Patient 08/06/11 1725      Chief Complaint  Patient presents with  . Abdominal Pain    epigastric  . Nausea    (Consider location/radiation/quality/duration/timing/severity/associated sxs/prior treatment) Patient is a 41 y.o. female presenting with abdominal pain. The history is provided by the patient.  Abdominal Pain The primary symptoms of the illness include abdominal pain and nausea. The primary symptoms of the illness do not include fever, shortness of breath, vomiting or dysuria.  Associated symptoms comments: She underwent laparoscopic cholecystectomy on 07-27-11 by Dr. Ezzard Standing. She has had an uncomplicated post-surgical course until today when she started experiencing epigastric pain at the site of an incision. She has had mild drainage from that wound, where the others have healed well without drainage. No significant redness. She denies fever. Pain progressed to cause nausea and diaphoresis. No cough, no change in bowel movements and no dysuria..    Past Medical History  Diagnosis Date  . Asthma   . IBS (irritable bowel syndrome)   . HLD (hyperlipidemia)   . Elevated cholesterol   . Acne   . GERD (gastroesophageal reflux disease)   . Nausea   . Difficulty sleeping     due to discomfort    Past Surgical History  Procedure Date  . Ectopic pregnancy surgery 2005  . Partial hysterectomy 2009  . Esophagogastroduodenoscopy 06/13/2011    Procedure: ESOPHAGOGASTRODUODENOSCOPY (EGD);  Surgeon: Beverley Fiedler, MD;  Location: [facility];  Service: Gastroenterology;  Laterality: N/A;  . Cholecystectomy 07/27/2011    Procedure: LAPAROSCOPIC CHOLECYSTECTOMY WITH INTRAOPERATIVE CHOLANGIOGRAM;  Surgeon: Kandis Cocking, MD;  Location: [facility];  Service: General;  Laterality: N/A;    Family History  Problem Relation Age of Onset  . Breast cancer Mother   . Stroke Maternal Grandmother      History  Substance Use Topics  . Smoking status: Never Smoker   . Smokeless tobacco: Never Used  . Alcohol Use: No    OB History    Grav Para Term Preterm Abortions TAB SAB Ect Mult Living                  Review of Systems  Constitutional: Negative for fever.  Respiratory: Negative for cough and shortness of breath.   Gastrointestinal: Positive for nausea and abdominal pain. Negative for vomiting.  Genitourinary: Negative for dysuria.    Allergies  Review of patient's allergies indicates no known allergies.  Home Medications   Current Outpatient Rx  Name Route Sig Dispense Refill  . ASPIRIN 81 MG PO TABS Oral Take 81 mg by mouth See admin instructions. Takes 1 tablet on mondays, wednesdays, and fridays    . VITAMIN D3 5000 UNITS PO CAPS Oral Take 1 capsule by mouth See admin instructions. Takes 1 tablet on mondays, wednesdays, and fridays    . GEMFIBROZIL 600 MG PO TABS Oral Take 600 mg by mouth 2 (two) times daily.     . GUAIFENESIN ER 600 MG PO TB12 Oral Take 600 mg by mouth 2 (two) times daily.     . LUBIPROSTONE 8 MCG PO CAPS Oral Take 8 mcg by mouth 2 (two) times daily.     . MELOXICAM 15 MG PO TABS Oral Take 15 mg by mouth every other day. Every other day    . MINOCYCLINE HCL 100 MG PO TABS Oral Take 100 mg by mouth  2 (two) times daily.    Marland Kitchen PANTOPRAZOLE SODIUM 40 MG PO TBEC Oral Take 1 tablet (40 mg total) by mouth daily. 30 tablet 1  . PSEUDOEPHEDRINE HCL 30 MG PO TABS Oral Take 30 mg by mouth every 4 (four) hours as needed. congestion    . SIMETHICONE 80 MG PO CHEW Oral Chew 80 mg by mouth every 6 (six) hours as needed. gas    . TRAMADOL HCL 50 MG PO TABS Oral Take 50 mg by mouth every 6 (six) hours as needed. pain    . VITAMIN E 1000 UNITS PO CAPS Oral Take 1,000 Units by mouth 2 (two) times daily.        BP 116/73  Pulse 100  Temp(Src) 97.5 F (36.4 C) (Oral)  Resp 16  SpO2 100%  Physical Exam  Constitutional: She appears well-developed and  well-nourished.  HENT:  Head: Normocephalic.  Neck: Normal range of motion. Neck supple.  Cardiovascular: Normal rate and regular rhythm.   Pulmonary/Chest: Effort normal and breath sounds normal. She has no rales.  Abdominal: Soft. Bowel sounds are normal. There is no rebound and no guarding.       Epigastric tenderness. Abdomen not significantly distended and is soft. Multiple surgical incisions healing appropriately, specifically, epigastric incision is without redness or active drainage. There is tenderness that is referred to epigastrium when palpating lateral and lower abdomen.  Musculoskeletal: Normal range of motion.  Neurological: She is alert. No cranial nerve deficit.  Skin: Skin is warm and dry. No rash noted.  Psychiatric: She has a normal mood and affect.    ED Course  Procedures (including critical care time)  Labs Reviewed  CBC - Abnormal; Notable for the following:    WBC 16.6 (*)    RBC 5.38 (*)    Hemoglobin 15.6 (*)    All other components within normal limits  DIFFERENTIAL - Abnormal; Notable for the following:    Neutrophils Relative 92 (*)    Neutro Abs 15.3 (*)    Lymphocytes Relative 5 (*)    All other components within normal limits  COMPREHENSIVE METABOLIC PANEL - Abnormal; Notable for the following:    Sodium 134 (*)    Glucose, Bld 171 (*)    All other components within normal limits  LIPASE, BLOOD   Results for orders placed during the hospital encounter of 08/06/11  CBC      Component Value Range   WBC 16.6 (*) 4.0 - 10.5 (K/uL)   RBC 5.38 (*) 3.87 - 5.11 (MIL/uL)   Hemoglobin 15.6 (*) 12.0 - 15.0 (g/dL)   HCT 40.9  81.1 - 91.4 (%)   MCV 83.6  78.0 - 100.0 (fL)   MCH 29.0  26.0 - 34.0 (pg)   MCHC 34.7  30.0 - 36.0 (g/dL)   RDW 78.2  95.6 - 21.3 (%)   Platelets 260  150 - 400 (K/uL)  DIFFERENTIAL      Component Value Range   Neutrophils Relative 92 (*) 43 - 77 (%)   Neutro Abs 15.3 (*) 1.7 - 7.7 (K/uL)   Lymphocytes Relative 5 (*) 12 -  46 (%)   Lymphs Abs 0.8  0.7 - 4.0 (K/uL)   Monocytes Relative 3  3 - 12 (%)   Monocytes Absolute 0.5  0.1 - 1.0 (K/uL)   Eosinophils Relative 0  0 - 5 (%)   Eosinophils Absolute 0.0  0.0 - 0.7 (K/uL)   Basophils Relative 0  0 - 1 (%)  Basophils Absolute 0.0  0.0 - 0.1 (K/uL)  COMPREHENSIVE METABOLIC PANEL      Component Value Range   Sodium 134 (*) 135 - 145 (mEq/L)   Potassium 3.5  3.5 - 5.1 (mEq/L)   Chloride 96  96 - 112 (mEq/L)   CO2 26  19 - 32 (mEq/L)   Glucose, Bld 171 (*) 70 - 99 (mg/dL)   BUN 15  6 - 23 (mg/dL)   Creatinine, Ser 0.98  0.50 - 1.10 (mg/dL)   Calcium 9.5  8.4 - 11.9 (mg/dL)   Total Protein 8.0  6.0 - 8.3 (g/dL)   Albumin 4.7  3.5 - 5.2 (g/dL)   AST 16  0 - 37 (U/L)   ALT 26  0 - 35 (U/L)   Alkaline Phosphatase 88  39 - 117 (U/L)   Total Bilirubin 0.6  0.3 - 1.2 (mg/dL)   GFR calc non Af Amer >90  >90 (mL/min)   GFR calc Af Amer >90  >90 (mL/min)  LIPASE, BLOOD      Component Value Range   Lipase 18  11 - 59 (U/L)    Dg Abd Acute W/chest  08/06/2011  *RADIOLOGY REPORT*  Clinical Data: Upper abdominal pain with nausea.  Cholecystectomy 1 week ago.  ACUTE ABDOMEN SERIES (ABDOMEN 2 VIEW & CHEST 1 VIEW)  Comparison: None.  Findings: Normal heart size with clear lung fields.  No bony abnormality.  Cholecystectomy clips right upper quadrant.  Scattered air-fluid levels in nondilated small bowel likely ileus.  No obstruction or visible free air.  Phleboliths in the pelvis.  Negative osseous structures.  IMPRESSION: Scattered air-fluid levels in nondilated small bowel likely ileus. No obstruction or free air.  Negative chest.  Original Report Authenticated By: Elsie Stain, M.D.     No diagnosis found.    MDM  CT scan ordered to evaluate post-surgical pain, nausea, leukocytosis. Pain addressed with pain medications.         Rodena Medin, PA-C 08/06/11 2042

## 2011-08-06 NOTE — Telephone Encounter (Signed)
Mrs. Nicolette Bang who was present with Mrs. Smither called to say that [name] was doing well after her surgery until she had an [employer] beef and cheddar sandwich and she is now having increased abdominal pain and nausea and diarrhea.  No fevers but chills.  I explained that some of the loose stools can be normal after eating fatty foods this soon postop from cholecystectomy but given the increased pain and nausea and chills, she should come to the ER for evaluation with labs and xrays to rule out complication of surgery.

## 2011-08-07 ENCOUNTER — Encounter (HOSPITAL_COMMUNITY): Payer: Self-pay | Admitting: *Deleted

## 2011-08-07 DIAGNOSIS — K56609 Unspecified intestinal obstruction, unspecified as to partial versus complete obstruction: Secondary | ICD-10-CM

## 2011-08-07 LAB — BASIC METABOLIC PANEL
CO2: 26 mEq/L (ref 19–32)
GFR calc non Af Amer: >90 mL/min (ref >90)
Glucose, Bld: 92 mg/dL (ref 70–99)
Potassium: 3.6 mEq/L (ref 3.5–5.1)
Sodium: 134 mEq/L — ABNORMAL LOW (ref 135–145)

## 2011-08-07 LAB — CBC
Hemoglobin: 13.9 g/dL (ref 12.0–15.0)
RBC: 5.04 MIL/uL (ref 3.87–5.11)
WBC: 11.3 10*3/uL — ABNORMAL HIGH (ref 4.0–10.5)

## 2011-08-07 MED ORDER — LIDOCAINE HCL 2 % EX GEL
Freq: Once | CUTANEOUS | Status: AC
  Administered 2011-08-07: 10
  Filled 2011-08-07: qty 10

## 2011-08-07 MED ORDER — ENOXAPARIN SODIUM 40 MG/0.4ML ~~LOC~~ SOLN
40.0000 mg | SUBCUTANEOUS | Status: DC
  Administered 2011-08-07 – 2011-08-10 (×4): 40 mg via SUBCUTANEOUS
  Filled 2011-08-07 (×5): qty 0.4

## 2011-08-07 MED ORDER — ONDANSETRON HCL 4 MG/2ML IJ SOLN
4.0000 mg | Freq: Four times a day (QID) | INTRAMUSCULAR | Status: DC | PRN
  Administered 2011-08-07: 4 mg via INTRAVENOUS

## 2011-08-07 MED ORDER — MORPHINE SULFATE 2 MG/ML IJ SOLN
2.0000 mg | INTRAMUSCULAR | Status: DC | PRN
  Administered 2011-08-07 – 2011-08-08 (×4): 2 mg via INTRAVENOUS
  Filled 2011-08-07 (×4): qty 1

## 2011-08-07 MED ORDER — ONDANSETRON HCL 4 MG/2ML IJ SOLN
INTRAMUSCULAR | Status: AC
  Filled 2011-08-07: qty 2

## 2011-08-07 MED ORDER — OXYMETAZOLINE HCL 0.05 % NA SOLN
1.0000 | Freq: Once | NASAL | Status: AC
  Administered 2011-08-07: 1 via NASAL
  Filled 2011-08-07: qty 15

## 2011-08-07 MED ORDER — POTASSIUM CHLORIDE IN NACL 20-0.9 MEQ/L-% IV SOLN
INTRAVENOUS | Status: DC
  Administered 2011-08-07 – 2011-08-08 (×5): via INTRAVENOUS
  Administered 2011-08-09: 125 mL via INTRAVENOUS
  Administered 2011-08-09 – 2011-08-10 (×3): via INTRAVENOUS
  Administered 2011-08-10 – 2011-08-11 (×2): 125 mL via INTRAVENOUS
  Administered 2011-08-11 – 2011-08-12 (×3): via INTRAVENOUS
  Filled 2011-08-07 (×18): qty 1000

## 2011-08-07 NOTE — ED Provider Notes (Signed)
Medical screening examination/treatment/procedure(s) were performed by non-physician practitioner and as supervising physician I was immediately available for consultation/collaboration.  Kierstan Auer R. Brilee Port, MD 08/07/11 0002 

## 2011-08-07 NOTE — Progress Notes (Signed)
Subjective: Says that she is improved from yesterday, still has some mild crampy pain and no flatus or BM  Objective: Vital signs in last 24 hours: Temp:  [97 F (36.1 C)-98.7 F (37.1 C)] 98.7 F (37.1 C) (05/27 0523) Pulse Rate:  [70-100] 77  (05/27 0523) Resp:  [16-18] 18  (05/27 0523) BP: (116-136)/(73-89) 131/87 mmHg (05/27 0523) SpO2:  [99 %-100 %] 100 % (05/27 0523) Weight:  [125 lb 7 oz (56.898 kg)] 125 lb 7 oz (56.898 kg) (05/27 0336) Last BM Date: 08/06/11  Intake/Output from previous day: 05/26 0701 - 05/27 0700 In: 271.7 [I.V.:241.7; NG/GT:30] Out: 1250 [Emesis/NG output:250] Intake/Output this shift:    General appearance: alert, cooperative and no distress GI: soft, minimal tenderness bilaterally, mild distension (patient and husband say it is improved), no peritoneal signs  Lab Results:   Basename 08/07/11 0420 08/06/11 1727  WBC 11.3* 16.6*  HGB 13.9 15.6*  HCT 42.3 45.0  PLT 276 260   BMET  Basename 08/07/11 0420 08/06/11 1727  NA 134* 134*  K 3.6 3.5  CL 99 96  CO2 26 26  GLUCOSE 92 171*  BUN 11 15  CREATININE 0.53 0.55  CALCIUM 8.5 9.5   PT/INR No results found for this basename: LABPROT:2,INR:2 in the last 72 hours ABG No results found for this basename: PHART:2,PCO2:2,PO2:2,HCO3:2 in the last 72 hours  Studies/Results: Ct Abdomen Pelvis W Contrast  08/06/2011  *RADIOLOGY REPORT*  Clinical Data: Midsternal pain radiating towards the back.  Nausea and vomiting.  Chills.  Diarrhea.  CT ABDOMEN AND PELVIS WITH CONTRAST  Technique:  Multidetector CT imaging of the abdomen and pelvis was performed following the standard protocol during bolus administration of intravenous contrast.  Contrast: OMNIPAQUE IOHEXOL 300 MG/ML  SOLN  Comparison: Multiple exams, including 08/06/2011 and 06/02/2011  Findings: A 4 mm hypodense lesion in the lateral segment left hepatic lobe on image 13 of series 2 is technically nonspecific but statistically likely  to be benign.  A similar 5 mm lesion is noted inferiorly in the left hepatic lobe, image 28 of series 2.  The stomach is distended with fluid, and mostly gasless loops of mildly dilated small bowel are present extending to a somewhat abrupt transition to nondilated small bowel in the ileum on image 60 of series 4, with the transition also seen on image 52 of series 3.  However, some of the proximal loops of small bowel are nondilated.  The spleen, pancreas, and adrenal glands appear normal.  A right kidney lower pole nonobstructive 2 mm renal calculus is present and there is a 1-2 mm left mid kidney nonobstructive renal calculus. No hydronephrosis or hydroureter.  No malrotation is observed. No pathologic retroperitoneal or porta hepatis adenopathy is identified.  No pathologic pelvic adenopathy is identified.  Left ovary appears normal.  There is a fluid density there is a fluid density 3.6 x 2.8 cm right ovarian lesion, likely a cyst but similar to 06/02/2011.  Uterus is absent.  A small amount of ascites is present tracking along the dilated bowel loops.  The appendix is not reliably identified.  IMPRESSION:  1.  Mildly distended loops of small bowel with internal air-fluid levels and low-level surrounding ascites and mesenteric edema, extending to a transition point in the pelvis.  An obvious cause for the transition point is not seen, although effusion cannot be excluded.  This likely represents early small bowel obstruction given the sharpness of the transition and nondistended nature of the distal  small bowel. 2.  The appendix is not visualized.  3.  Several tiny hypodense lesions in the liver are likely tiny cysts or hemangiomas, although technically nonspecific. 4.  Bilateral solitary punctate nonobstructive renal calculi. 5.  Right ovarian hypodense lesion is likely a cyst and measures up to 3.6 cm in short axis.  A similar cystic lesion was shown on 06/02/2011, and this may warrant sonographic follow-up.   Original Report Authenticated By: Dellia Cloud, M.D.   Dg Abd Acute W/chest  08/06/2011  *RADIOLOGY REPORT*  Clinical Data: Upper abdominal pain with nausea.  Cholecystectomy 1 week ago.  ACUTE ABDOMEN SERIES (ABDOMEN 2 VIEW & CHEST 1 VIEW)  Comparison: None.  Findings: Normal heart size with clear lung fields.  No bony abnormality.  Cholecystectomy clips right upper quadrant.  Scattered air-fluid levels in nondilated small bowel likely ileus.  No obstruction or visible free air.  Phleboliths in the pelvis.  Negative osseous structures.  IMPRESSION: Scattered air-fluid levels in nondilated small bowel likely ileus. No obstruction or free air.  Negative chest.  Original Report Authenticated By: Elsie Stain, M.D.    Anti-infectives: Anti-infectives    None      Assessment/Plan: s/p * No surgery found * she says that she feels better but has some dark bilious output from NG tube.  WBC improved.  I am not convinced yet that she is better but since she seems to be improving we will try another day of nonoperative management  LOS: 1 day    Lodema Pilot DAVID 08/07/2011

## 2011-08-07 NOTE — H&P (Signed)
[name] is an 41 y.o. female.  HPI: this patient is 10 days status post laparoscopic cholecystectomy with intraoperative cholangiogram for treatment of biliary dyskinesia. She had an uneventful procedure and was recovering uneventfully as well until she began having acute onset of nausea and vomiting and some increased abdominal pain after eating [employer] earlier today. She felt so ill that she needed to come to the emergency room. She has associated abdominal distention. She says that her pain feels similar to her gallbladder pain with "pressure" in her upper midabdomen.  She actually has had a loose bowel movement this morning. She denies any fevers or chills.  Past Medical History  Diagnosis Date  . Asthma   . IBS (irritable bowel syndrome)   . HLD (hyperlipidemia)   . Elevated cholesterol   . Acne   . GERD (gastroesophageal reflux disease)   . Nausea   . Difficulty sleeping     due to discomfort    Past Surgical History  Procedure Date  . Ectopic pregnancy surgery 2005  . Partial hysterectomy 2009  . Esophagogastroduodenoscopy 06/13/2011    Procedure: ESOPHAGOGASTRODUODENOSCOPY (EGD);  Surgeon: Beverley Fiedler, MD;  Location: [facility];  Service: Gastroenterology;  Laterality: N/A;  . Cholecystectomy 07/27/2011    Procedure: LAPAROSCOPIC CHOLECYSTECTOMY WITH INTRAOPERATIVE CHOLANGIOGRAM;  Surgeon: Kandis Cocking, MD;  Location: [facility];  Service: General;  Laterality: N/A;    Family History  Problem Relation Age of Onset  . Breast cancer Mother   . Stroke Maternal Grandmother     Social History:  reports that she has never smoked. She has never used smokeless tobacco. She reports that she does not drink alcohol or use illicit drugs.  Allergies: No Known Allergies  Medications: I have reviewed the patient's current medications.  Results for orders placed during the hospital encounter of 08/06/11 (from the past 48 hour(s))  CBC     Status: Abnormal   Collection Time   08/06/11  5:27 PM      Component Value Range Comment   WBC 16.6 (*) 4.0 - 10.5 (K/uL)    RBC 5.38 (*) 3.87 - 5.11 (MIL/uL)    Hemoglobin 15.6 (*) 12.0 - 15.0 (g/dL)    HCT 82.9  56.2 - 13.0 (%)    MCV 83.6  78.0 - 100.0 (fL)    MCH 29.0  26.0 - 34.0 (pg)    MCHC 34.7  30.0 - 36.0 (g/dL)    RDW 86.5  78.4 - 69.6 (%)    Platelets 260  150 - 400 (K/uL)   DIFFERENTIAL     Status: Abnormal   Collection Time   08/06/11  5:27 PM      Component Value Range Comment   Neutrophils Relative 92 (*) 43 - 77 (%)    Neutro Abs 15.3 (*) 1.7 - 7.7 (K/uL)    Lymphocytes Relative 5 (*) 12 - 46 (%)    Lymphs Abs 0.8  0.7 - 4.0 (K/uL)    Monocytes Relative 3  3 - 12 (%)    Monocytes Absolute 0.5  0.1 - 1.0 (K/uL)    Eosinophils Relative 0  0 - 5 (%)    Eosinophils Absolute 0.0  0.0 - 0.7 (K/uL)    Basophils Relative 0  0 - 1 (%)    Basophils Absolute 0.0  0.0 - 0.1 (K/uL)   COMPREHENSIVE METABOLIC PANEL     Status: Abnormal   Collection Time   08/06/11  5:27 PM  Component Value Range Comment   Sodium 134 (*) 135 - 145 (mEq/L)    Potassium 3.5  3.5 - 5.1 (mEq/L)    Chloride 96  96 - 112 (mEq/L)    CO2 26  19 - 32 (mEq/L)    Glucose, Bld 171 (*) 70 - 99 (mg/dL)    BUN 15  6 - 23 (mg/dL)    Creatinine, Ser 2.95  0.50 - 1.10 (mg/dL)    Calcium 9.5  8.4 - 10.5 (mg/dL)    Total Protein 8.0  6.0 - 8.3 (g/dL)    Albumin 4.7  3.5 - 5.2 (g/dL)    AST 16  0 - 37 (U/L)    ALT 26  0 - 35 (U/L)    Alkaline Phosphatase 88  39 - 117 (U/L)    Total Bilirubin 0.6  0.3 - 1.2 (mg/dL)    GFR calc non Af Amer >90  >90 (mL/min)    GFR calc Af Amer >90  >90 (mL/min)   LIPASE, BLOOD     Status: Normal   Collection Time   08/06/11  5:27 PM      Component Value Range Comment   Lipase 18  11 - 59 (U/L)     Ct Abdomen Pelvis W Contrast  08/06/2011  *RADIOLOGY REPORT*  Clinical Data: Midsternal pain radiating towards the back.  Nausea and vomiting.  Chills.  Diarrhea.  CT ABDOMEN AND PELVIS WITH CONTRAST   Technique:  Multidetector CT imaging of the abdomen and pelvis was performed following the standard protocol during bolus administration of intravenous contrast.  Contrast: OMNIPAQUE IOHEXOL 300 MG/ML  SOLN  Comparison: Multiple exams, including 08/06/2011 and 06/02/2011  Findings: A 4 mm hypodense lesion in the lateral segment left hepatic lobe on image 13 of series 2 is technically nonspecific but statistically likely to be benign.  A similar 5 mm lesion is noted inferiorly in the left hepatic lobe, image 28 of series 2.  The stomach is distended with fluid, and mostly gasless loops of mildly dilated small bowel are present extending to a somewhat abrupt transition to nondilated small bowel in the ileum on image 60 of series 4, with the transition also seen on image 52 of series 3.  However, some of the proximal loops of small bowel are nondilated.  The spleen, pancreas, and adrenal glands appear normal.  A right kidney lower pole nonobstructive 2 mm renal calculus is present and there is a 1-2 mm left mid kidney nonobstructive renal calculus. No hydronephrosis or hydroureter.  No malrotation is observed. No pathologic retroperitoneal or porta hepatis adenopathy is identified.  No pathologic pelvic adenopathy is identified.  Left ovary appears normal.  There is a fluid density there is a fluid density 3.6 x 2.8 cm right ovarian lesion, likely a cyst but similar to 06/02/2011.  Uterus is absent.  A small amount of ascites is present tracking along the dilated bowel loops.  The appendix is not reliably identified.  IMPRESSION:  1.  Mildly distended loops of small bowel with internal air-fluid levels and low-level surrounding ascites and mesenteric edema, extending to a transition point in the pelvis.  An obvious cause for the transition point is not seen, although effusion cannot be excluded.  This likely represents early small bowel obstruction given the sharpness of the transition and nondistended nature of  the distal small bowel. 2.  The appendix is not visualized.  3.  Several tiny hypodense lesions in the liver are likely tiny cysts  or hemangiomas, although technically nonspecific. 4.  Bilateral solitary punctate nonobstructive renal calculi. 5.  Right ovarian hypodense lesion is likely a cyst and measures up to 3.6 cm in short axis.  A similar cystic lesion was shown on 06/02/2011, and this may warrant sonographic follow-up.  Original Report Authenticated By: Dellia Cloud, M.D.   Dg Abd Acute W/chest  08/06/2011  *RADIOLOGY REPORT*  Clinical Data: Upper abdominal pain with nausea.  Cholecystectomy 1 week ago.  ACUTE ABDOMEN SERIES (ABDOMEN 2 VIEW & CHEST 1 VIEW)  Comparison: None.  Findings: Normal heart size with clear lung fields.  No bony abnormality.  Cholecystectomy clips right upper quadrant.  Scattered air-fluid levels in nondilated small bowel likely ileus.  No obstruction or visible free air.  Phleboliths in the pelvis.  Negative osseous structures.  IMPRESSION: Scattered air-fluid levels in nondilated small bowel likely ileus. No obstruction or free air.  Negative chest.  Original Report Authenticated By: Elsie Stain, M.D.    Blood pressure 125/83, pulse 75, temperature 98.7 F (37.1 C), temperature source Oral, resp. rate 18, SpO2 99.00%. General appearance: alert, cooperative and no distress GI: soft, not much tenderness on exam and her distension appears very mild as well, wounds without infection.  No evidence of hernia.  no peritoneal signs.  Assessment/Plan: Abdominal pain and possible bowel obstruction As she has a postoperative bowel obstruction. She has nausea and her vomiting has now resolved but she has mild abdominal distention and a CT scan which is concerning for possible bowel obstruction. She does have several prior lower abdominal and pelvic surgeries which could be the source of her bowel obstruction and this could be an adhesive bowel obstruction. I do not see  any evidence of trocar site hernia which could be causing this. There is no hernia seen on CT scan as well. It is concerning that this has occurred this soon after surgery. I discussed with her the options for diagnostic laparoscopy versus nonoperative management with NG tube decompression. We have decided that since she is feeling better and she is no longer vomiting and her abdominal exam is not very impressive for abdominal catastrophe, we will attempt a trial of nonoperative management. I have recommended NG tube placement and IV hydration and repeat abdominal exams. If she is not improved after 24 hours then we will consider laparoscopy for evaluation. We discussed the pros and cons of this option and she understands that if this is from a high-grade obstruction or a strangulated loop of intestine then earlier intervention would be better.  Lodema Pilot DAVID 08/07/2011, 1:26 AM

## 2011-08-08 ENCOUNTER — Inpatient Hospital Stay (HOSPITAL_COMMUNITY): Payer: BC Managed Care – PPO

## 2011-08-08 LAB — DIFFERENTIAL
Basophils Relative: 0 % (ref 0–1)
Lymphs Abs: 1.3 10*3/uL (ref 0.7–4.0)
Monocytes Absolute: 0.6 10*3/uL (ref 0.1–1.0)
Monocytes Relative: 8 % (ref 3–12)
Neutro Abs: 6 10*3/uL (ref 1.7–7.7)

## 2011-08-08 LAB — CBC
HCT: 40 % (ref 36.0–46.0)
Hemoglobin: 13.4 g/dL (ref 12.0–15.0)
MCHC: 33.5 g/dL (ref 30.0–36.0)

## 2011-08-08 LAB — BASIC METABOLIC PANEL
BUN: 12 mg/dL (ref 6–23)
Chloride: 104 mEq/L (ref 96–112)
GFR calc Af Amer: >90 mL/min (ref >90)
Glucose, Bld: 74 mg/dL (ref 70–99)
Potassium: 3.8 mEq/L (ref 3.5–5.1)

## 2011-08-08 MED ORDER — MENTHOL 3 MG MT LOZG
1.0000 | LOZENGE | OROMUCOSAL | Status: DC | PRN
  Filled 2011-08-08: qty 9

## 2011-08-08 MED ORDER — PHENOL 1.4 % MT LIQD
1.0000 | OROMUCOSAL | Status: DC | PRN
  Filled 2011-08-08: qty 177

## 2011-08-08 NOTE — Progress Notes (Signed)
UR complete 

## 2011-08-08 NOTE — Progress Notes (Addendum)
Subjective: Looks, good high volume output thru NG, no flatus.  She doesn't really seem to feel to bad with decompression.   Objective: Vital signs in last 24 hours: Temp:  [97.8 F (36.6 C)-98.3 F (36.8 C)] 98.2 F (36.8 C) (05/28 0557) Pulse Rate:  [83-95] 95  (05/28 0557) Resp:  [18-20] 18  (05/28 0557) BP: (113-144)/(75-95) 123/81 mmHg (05/28 0557) SpO2:  [98 %-100 %] 100 % (05/28 0557) Last BM Date: 08/06/11  1650 ml/NG yesterday, VSS, labs OK, CT 5/26: distended SB,with air fluid, ascites, mesenteric edema  Intake/Output from previous day: 05/27 0701 - 05/28 0700 In: 3114.6 [I.V.:3114.6] Out: 2575 [Urine:925; Emesis/NG output:1650] Intake/Output this shift:    General appearance: alert, cooperative and no distress Resp: clear to auscultation bilaterally GI: soft, not tender or distended, few hypoactive bowel sounds, No flatus, or BM  Lab Results:   [facility] 08/08/11 0353 08/07/11 0420  WBC 8.0 11.3*  HGB 13.4 13.9  HCT 40.0 42.3  PLT 222 276    BMET  Basename 08/08/11 0353 08/07/11 0420  NA 140 134*  K 3.8 3.6  CL 104 99  CO2 27 26  GLUCOSE 74 92  BUN 12 11  CREATININE 0.57 0.53  CALCIUM 8.1* 8.5   PT/INR No results found for this basename: LABPROT:2,INR:2 in the last 72 hours   Lab 08/06/11 1727  AST 16  ALT 26  ALKPHOS 88  BILITOT 0.6  PROT 8.0  ALBUMIN 4.7     Lipase     Component Value Date/Time   LIPASE 18 08/06/2011 1727     Studies/Results: Ct Abdomen Pelvis W Contrast  08/06/2011  *RADIOLOGY REPORT*  Clinical Data: Midsternal pain radiating towards the back.  Nausea and vomiting.  Chills.  Diarrhea.  CT ABDOMEN AND PELVIS WITH CONTRAST  Technique:  Multidetector CT imaging of the abdomen and pelvis was performed following the standard protocol during bolus administration of intravenous contrast.  Contrast: OMNIPAQUE IOHEXOL 300 MG/ML  SOLN  Comparison: Multiple exams, including 08/06/2011 and 06/02/2011  Findings: A 4 mm  hypodense lesion in the lateral segment left hepatic lobe on image 13 of series 2 is technically nonspecific but statistically likely to be benign.  A similar 5 mm lesion is noted inferiorly in the left hepatic lobe, image 28 of series 2.  The stomach is distended with fluid, and mostly gasless loops of mildly dilated small bowel are present extending to a somewhat abrupt transition to nondilated small bowel in the ileum on image 60 of series 4, with the transition also seen on image 52 of series 3.  However, some of the proximal loops of small bowel are nondilated.  The spleen, pancreas, and adrenal glands appear normal.  A right kidney lower pole nonobstructive 2 mm renal calculus is present and there is a 1-2 mm left mid kidney nonobstructive renal calculus. No hydronephrosis or hydroureter.  No malrotation is observed. No pathologic retroperitoneal or porta hepatis adenopathy is identified.  No pathologic pelvic adenopathy is identified.  Left ovary appears normal.  There is a fluid density there is a fluid density 3.6 x 2.8 cm right ovarian lesion, likely a cyst but similar to 06/02/2011.  Uterus is absent.  A small amount of ascites is present tracking along the dilated bowel loops.  The appendix is not reliably identified.  IMPRESSION:  1.  Mildly distended loops of small bowel with internal air-fluid levels and low-level surrounding ascites and mesenteric edema, extending to a transition point in the  pelvis.  An obvious cause for the transition point is not seen, although effusion cannot be excluded.  This likely represents early small bowel obstruction given the sharpness of the transition and nondistended nature of the distal small bowel. 2.  The appendix is not visualized.  3.  Several tiny hypodense lesions in the liver are likely tiny cysts or hemangiomas, although technically nonspecific. 4.  Bilateral solitary punctate nonobstructive renal calculi. 5.  Right ovarian hypodense lesion is likely a cyst  and measures up to 3.6 cm in short axis.  A similar cystic lesion was shown on 06/02/2011, and this may warrant sonographic follow-up.  Original Report Authenticated By: Dellia Cloud, M.D.   Dg Abd Acute W/chest  08/06/2011  *RADIOLOGY REPORT*  Clinical Data: Upper abdominal pain with nausea.  Cholecystectomy 1 week ago.  ACUTE ABDOMEN SERIES (ABDOMEN 2 VIEW & CHEST 1 VIEW)  Comparison: None.  Findings: Normal heart size with clear lung fields.  No bony abnormality.  Cholecystectomy clips right upper quadrant.  Scattered air-fluid levels in nondilated small bowel likely ileus.  No obstruction or visible free air.  Phleboliths in the pelvis.  Negative osseous structures.  IMPRESSION: Scattered air-fluid levels in nondilated small bowel likely ileus. No obstruction or free air.  Negative chest.  Original Report Authenticated By: Elsie Stain, M.D.    Medications:    . enoxaparin  40 mg Subcutaneous Q24H  . ondansetron        Assessment/Plan Post op SBO s/p cholecystectomy for Biliary dyskinsia 07/27/11 Patient Active Problem List  Diagnoses  . Asthma  . IBS (irritable bowel syndrome)  . Hyperlipidemia  . Renal stone  . Dyspepsia/Gerd  . Epigastric pain  . 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    Plan:  Continue ng drainage, check films to see if there is any improvement.   LOS: 2 days   JENNINGS,WILLARD 08/08/2011  SBO seems unrelated to gall bladder surgery.  The patient has had a hysterectomy.  She is better clinically, but I am not sure the SBO is any better.    I ordered another KUB in the AM.  I spent 20 minutes going over the findings and the options.  Her husband was in the room.  Ovidio Kin, MD, [facility] Pager: [phone] Office phone:  [phone]

## 2011-08-09 ENCOUNTER — Inpatient Hospital Stay (HOSPITAL_COMMUNITY): Payer: BC Managed Care – PPO

## 2011-08-09 LAB — CBC
HCT: 38 % (ref 36.0–46.0)
MCH: 28.4 pg (ref 26.0–34.0)
MCHC: 33.4 g/dL (ref 30.0–36.0)
MCV: 85 fL (ref 78.0–100.0)
RDW: 12.5 % (ref 11.5–15.5)

## 2011-08-09 LAB — BASIC METABOLIC PANEL
BUN: 12 mg/dL (ref 6–23)
CO2: 17 mEq/L — ABNORMAL LOW (ref 19–32)
Chloride: 106 mEq/L (ref 96–112)
Creatinine, Ser: 0.47 mg/dL — ABNORMAL LOW (ref 0.50–1.10)
GFR calc Af Amer: >90 mL/min (ref >90)
Glucose, Bld: 61 mg/dL — ABNORMAL LOW (ref 70–99)

## 2011-08-09 LAB — DIFFERENTIAL
Basophils Absolute: 0 10*3/uL (ref 0.0–0.1)
Basophils Relative: 1 % (ref 0–1)
Eosinophils Absolute: 0.1 10*3/uL (ref 0.0–0.7)
Eosinophils Relative: 1 % (ref 0–5)
Monocytes Absolute: 0.5 10*3/uL (ref 0.1–1.0)

## 2011-08-09 MED ORDER — KETOROLAC TROMETHAMINE 30 MG/ML IJ SOLN
30.0000 mg | Freq: Once | INTRAMUSCULAR | Status: AC
  Administered 2011-08-10: 30 mg via INTRAVENOUS
  Filled 2011-08-09: qty 1

## 2011-08-09 MED ORDER — KETOROLAC TROMETHAMINE 30 MG/ML IJ SOLN
30.0000 mg | Freq: Once | INTRAMUSCULAR | Status: AC
  Administered 2011-08-09: 30 mg via INTRAVENOUS
  Filled 2011-08-09: qty 1

## 2011-08-09 NOTE — Progress Notes (Signed)
Subjective: Still putting allot out thru NG.  Less distension and discomfort. No flatus, No BM  Objective: Vital signs in last 24 hours: Temp:  [98.4 F (36.9 C)-98.9 F (37.2 C)] 98.8 F (37.1 C) (05/29 0626) Pulse Rate:  [94-104] 104  (05/29 0626) Resp:  [18-20] 18  (05/29 0626) BP: (122-137)/(76-84) 122/76 mmHg (05/29 0626) SpO2:  [98 %-100 %] 100 % (05/29 0626) Last BM Date: 08/06/11  1900 ml recorded Ng drainage yesterday, no BM recorded, VSS, labs OK, abd film does not look to me like it's improved, Radiology reading pending.  Intake/Output from previous day: 2022-09-06 0701 - 05/29 0700 In: 1943.8 [I.V.:1943.8] Out: 3650 [Urine:1750; Emesis/NG output:1900] Intake/Output this shift:    General appearance: alert, cooperative and no distress GI: soft, not really tender, say she still has some discomfort mid abd below umbilicus.  NO BM/flatus  Lab Results:   Basename 08/09/11 0336 06-Sep-2011 0353  WBC 8.8 8.0  HGB 12.7 13.4  HCT 38.0 40.0  PLT 220 222    BMET  Basename 08/09/11 0336 09-06-2011 0353  NA 138 140  K 4.2 3.8  CL 106 104  CO2 17* 27  GLUCOSE 61* 74  BUN 12 12  CREATININE 0.47* 0.57  CALCIUM 8.6 8.1*   PT/INR No results found for this basename: LABPROT:2,INR:2 in the last 72 hours   Lab 08/06/11 1727  AST 16  ALT 26  ALKPHOS 88  BILITOT 0.6  PROT 8.0  ALBUMIN 4.7     Lipase     Component Value Date/Time   LIPASE 18 08/06/2011 1727     Studies/Results: Dg Abd 2 Views  2011-09-06  *RADIOLOGY REPORT*  Clinical Data: History of small bowel obstruction.  Abdominal distension.  ABDOMEN - 2 VIEW  Comparison: Abdominal radiograph 08/06/2011.  Findings: A nasogastric tube is seen with tip and side port in the stomach.  Surgical clips project over the right upper quadrant of the abdomen, consistent with prior cholecystectomy.  There are several borderline dilated and mildly dilated (up to 3.1 cm in diameter) gas-filled loops of small bowel in the  abdomen, and several small air fluid levels are noted on the upright view. Today's study does demonstrate some gas and stool in the colon, however, there is no definite distal colonic or rectal gas identified.  No gross evidence of pneumoperitoneum at this time.  IMPRESSION: 1.  Nonspecific bowel gas pattern, as above, which remains concerning for a partial small bowel obstruction. 2.  No gross pneumoperitoneum at this time. 3.  Postoperative changes and support apparatus, as above.  Original Report Authenticated By: Florencia Reasons, M.D.    Medications:    . enoxaparin  40 mg Subcutaneous Q24H    Assessment/Plan Post op SBO s/p cholecystectomy for Biliary dyskinsia 07/27/11  Patient Active Problem List   Diagnoses   .  Asthma   .  IBS (irritable bowel syndrome)   .  Hyperlipidemia   .  Renal stone   .  Dyspepsia/Gerd   .  Epigastric pain   .  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     Plan:  Continue NG drainage, Increase IV rate, to keep up with fluid, ambulate more.    LOS: 3 days   JENNINGS,WILLARD 08/09/2011  Things seem stalled.  Will recheck labs and KUB tomorrow.  If not better, will plan surgery tomorrow.  Discussed thoroughly with the patient the possibilities of laparoscopic vs open surgery and the possibility of bowel resection.  Will  decide in AM about surgery.  Ovidio Kin, MD, [facility] Surgery Pager: [phone] Office phone:  [phone]

## 2011-08-10 ENCOUNTER — Inpatient Hospital Stay (HOSPITAL_COMMUNITY): Payer: BC Managed Care – PPO

## 2011-08-10 ENCOUNTER — Encounter (HOSPITAL_COMMUNITY): Admission: EM | Disposition: A | Discharge status: Home / Self Care | Payer: Self-pay | Source: Home / Self Care

## 2011-08-10 ENCOUNTER — Encounter (HOSPITAL_COMMUNITY): Payer: Self-pay | Admitting: Anesthesiology

## 2011-08-10 ENCOUNTER — Inpatient Hospital Stay (HOSPITAL_COMMUNITY): Payer: BC Managed Care – PPO | Admitting: Anesthesiology

## 2011-08-10 DIAGNOSIS — K565 Intestinal adhesions [bands], unspecified as to partial versus complete obstruction: Secondary | ICD-10-CM

## 2011-08-10 LAB — DIFFERENTIAL
Eosinophils Relative: 1 % (ref 0–5)
Lymphocytes Relative: 8 % — ABNORMAL LOW (ref 12–46)
Lymphs Abs: 0.7 10*3/uL (ref 0.7–4.0)
Monocytes Absolute: 0.7 10*3/uL (ref 0.1–1.0)

## 2011-08-10 LAB — BASIC METABOLIC PANEL
CO2: 15 mEq/L — ABNORMAL LOW (ref 19–32)
Calcium: 8.6 mg/dL (ref 8.4–10.5)
Creatinine, Ser: 0.45 mg/dL — ABNORMAL LOW (ref 0.50–1.10)
Glucose, Bld: 62 mg/dL — ABNORMAL LOW (ref 70–99)

## 2011-08-10 LAB — GLUCOSE, CAPILLARY

## 2011-08-10 LAB — CBC
HCT: 38.1 % (ref 36.0–46.0)
MCV: 84.5 fL (ref 78.0–100.0)
RBC: 4.51 MIL/uL (ref 3.87–5.11)
WBC: 8.1 10*3/uL (ref 4.0–10.5)

## 2011-08-10 SURGERY — LYSIS, ADHESIONS, LAPAROSCOPIC
Anesthesia: General | Site: Abdomen | Wound class: Clean Contaminated

## 2011-08-10 MED ORDER — ROCURONIUM BROMIDE 100 MG/10ML IV SOLN
INTRAVENOUS | Status: DC | PRN
  Administered 2011-08-10: 20 mg via INTRAVENOUS

## 2011-08-10 MED ORDER — DEXAMETHASONE SODIUM PHOSPHATE 10 MG/ML IJ SOLN
INTRAMUSCULAR | Status: DC | PRN
  Administered 2011-08-10: 10 mg via INTRAVENOUS

## 2011-08-10 MED ORDER — LACTATED RINGERS IV SOLN: INTRAVENOUS | Status: DC

## 2011-08-10 MED ORDER — BUPIVACAINE HCL 0.25 % IJ SOLN
INTRAMUSCULAR | Status: DC | PRN
  Administered 2011-08-10: 20 mL

## 2011-08-10 MED ORDER — FENTANYL CITRATE 0.05 MG/ML IJ SOLN
INTRAMUSCULAR | Status: AC
  Filled 2011-08-10: qty 2

## 2011-08-10 MED ORDER — NEOSTIGMINE METHYLSULFATE 1 MG/ML IJ SOLN
INTRAMUSCULAR | Status: DC | PRN
  Administered 2011-08-10: 3 mg via INTRAVENOUS

## 2011-08-10 MED ORDER — HYDROCODONE-ACETAMINOPHEN 5-325 MG PO TABS
1.0000 | ORAL_TABLET | ORAL | Status: DC | PRN
  Administered 2011-08-11: 1 via ORAL
  Administered 2011-08-11: 2 via ORAL
  Filled 2011-08-10: qty 2
  Filled 2011-08-10: qty 1

## 2011-08-10 MED ORDER — LACTATED RINGERS IR SOLN
Status: DC | PRN
  Administered 2011-08-10: 1000 mL

## 2011-08-10 MED ORDER — PROMETHAZINE HCL 25 MG/ML IJ SOLN: 6.2500 mg | INTRAMUSCULAR | Status: DC | PRN

## 2011-08-10 MED ORDER — 0.9 % SODIUM CHLORIDE (POUR BTL) OPTIME
TOPICAL | Status: DC | PRN
  Administered 2011-08-10: 2000 mL

## 2011-08-10 MED ORDER — GLYCOPYRROLATE 0.2 MG/ML IJ SOLN
INTRAMUSCULAR | Status: DC | PRN
  Administered 2011-08-10: 0.4 mg via INTRAVENOUS

## 2011-08-10 MED ORDER — DEXTROSE 5 % IV SOLN
1.0000 g | INTRAVENOUS | Status: DC | PRN
  Administered 2011-08-10: 1 g via INTRAVENOUS

## 2011-08-10 MED ORDER — BUPIVACAINE HCL (PF) 0.25 % IJ SOLN
INTRAMUSCULAR | Status: AC
  Filled 2011-08-10: qty 30

## 2011-08-10 MED ORDER — SUFENTANIL CITRATE 50 MCG/ML IV SOLN
INTRAVENOUS | Status: DC | PRN
  Administered 2011-08-10: 10 ug via INTRAVENOUS
  Administered 2011-08-10 (×2): 5 ug via INTRAVENOUS
  Administered 2011-08-10 (×2): 10 ug via INTRAVENOUS
  Administered 2011-08-10 (×2): 5 ug via INTRAVENOUS

## 2011-08-10 MED ORDER — PROPOFOL 10 MG/ML IV EMUL
INTRAVENOUS | Status: DC | PRN
  Administered 2011-08-10: 150 mg via INTRAVENOUS

## 2011-08-10 MED ORDER — MIDAZOLAM HCL 5 MG/5ML IJ SOLN
INTRAMUSCULAR | Status: DC | PRN
  Administered 2011-08-10: 2 mg via INTRAVENOUS

## 2011-08-10 MED ORDER — FENTANYL CITRATE 0.05 MG/ML IJ SOLN
25.0000 ug | INTRAMUSCULAR | Status: DC | PRN
  Administered 2011-08-10 (×4): 25 ug via INTRAVENOUS

## 2011-08-10 MED ORDER — PHENYLEPHRINE HCL 10 MG/ML IJ SOLN
INTRAMUSCULAR | Status: DC | PRN
  Administered 2011-08-10: 40 ug via INTRAVENOUS

## 2011-08-10 MED ORDER — ONDANSETRON HCL 4 MG/2ML IJ SOLN
INTRAMUSCULAR | Status: DC | PRN
  Administered 2011-08-10: 4 mg via INTRAVENOUS

## 2011-08-10 MED ORDER — LACTATED RINGERS IV SOLN
INTRAVENOUS | Status: DC | PRN
  Administered 2011-08-10 (×2): via INTRAVENOUS

## 2011-08-10 MED ORDER — SUCCINYLCHOLINE CHLORIDE 20 MG/ML IJ SOLN
INTRAMUSCULAR | Status: DC | PRN
  Administered 2011-08-10: 100 mg via INTRAVENOUS

## 2011-08-10 MED ORDER — LACTATED RINGERS IV SOLN
INTRAVENOUS | Status: DC | PRN
  Administered 2011-08-10: 13:00:00 via INTRAVENOUS

## 2011-08-10 MED ORDER — ONDANSETRON HCL 4 MG PO TABS: 4.0000 mg | ORAL_TABLET | Freq: Four times a day (QID) | ORAL | Status: DC | PRN

## 2011-08-10 MED ORDER — KETAMINE HCL 50 MG/ML IJ SOLN
INTRAMUSCULAR | Status: DC | PRN
  Administered 2011-08-10: 25 mg via INTRAMUSCULAR

## 2011-08-10 MED ORDER — ONDANSETRON HCL 4 MG/2ML IJ SOLN: 4.0000 mg | Freq: Four times a day (QID) | INTRAMUSCULAR | Status: DC | PRN

## 2011-08-10 MED ORDER — ENOXAPARIN SODIUM 30 MG/0.3ML ~~LOC~~ SOLN
30.0000 mg | SUBCUTANEOUS | Status: DC
  Administered 2011-08-11 – 2011-08-13 (×3): 30 mg via SUBCUTANEOUS
  Filled 2011-08-10 (×4): qty 0.3

## 2011-08-10 MED ORDER — LIDOCAINE HCL (CARDIAC) 20 MG/ML IV SOLN
INTRAVENOUS | Status: DC | PRN
  Administered 2011-08-10: 40 mg via INTRAVENOUS

## 2011-08-10 MED ORDER — MORPHINE SULFATE 2 MG/ML IJ SOLN
1.0000 mg | INTRAMUSCULAR | Status: DC | PRN
  Administered 2011-08-10 (×2): 2 mg via INTRAVENOUS
  Administered 2011-08-10: 1 mg via INTRAVENOUS
  Administered 2011-08-11 (×3): 2 mg via INTRAVENOUS
  Filled 2011-08-10 (×6): qty 1

## 2011-08-10 MED ORDER — LIDOCAINE HCL 4 % MT SOLN
OROMUCOSAL | Status: DC | PRN
  Administered 2011-08-10: 4 mL via TOPICAL

## 2011-08-10 SURGICAL SUPPLY — 76 items
ADH SKN CLS APL DERMABOND .7 (GAUZE/BANDAGES/DRESSINGS) ×2
APPLIER CLIP 5 13 M/L LIGAMAX5 (MISCELLANEOUS)
APPLIER CLIP ROT 10 11.4 M/L (STAPLE)
APR CLP MED LRG 11.4X10 (STAPLE)
APR CLP MED LRG 5 ANG JAW (MISCELLANEOUS)
BLADE EXTENDED COATED 6.5IN (ELECTRODE) ×2 IMPLANT
BLADE HEX COATED 2.75 (ELECTRODE) ×3 IMPLANT
BLADE SURG 15 STRL LF DISP TIS (BLADE) ×1 IMPLANT
BLADE SURG 15 STRL SS (BLADE) ×3
BLADE SURG SZ10 CARB STEEL (BLADE) ×6 IMPLANT
CANISTER SUCTION 2500CC (MISCELLANEOUS) ×3 IMPLANT
CANNULA ENDOPATH XCEL 11M (ENDOMECHANICALS) IMPLANT
CELLS DAT CNTRL 66122 CELL SVR (MISCELLANEOUS) IMPLANT
CLIP APPLIE 5 13 M/L LIGAMAX5 (MISCELLANEOUS) IMPLANT
CLIP APPLIE ROT 10 11.4 M/L (STAPLE) IMPLANT
CLOTH BEACON ORANGE TIMEOUT ST (SAFETY) ×3 IMPLANT
COVER MAYO STAND STRL (DRAPES) ×3 IMPLANT
CUTTER FLEX LINEAR 45M (STAPLE) IMPLANT
DECANTER SPIKE VIAL GLASS SM (MISCELLANEOUS) ×3 IMPLANT
DERMABOND ADVANCED (GAUZE/BANDAGES/DRESSINGS) ×1
DERMABOND ADVANCED .7 DNX12 (GAUZE/BANDAGES/DRESSINGS) ×1 IMPLANT
DRAPE LAPAROSCOPIC ABDOMINAL (DRAPES) ×3 IMPLANT
DRAPE LG THREE QUARTER DISP (DRAPES) ×2 IMPLANT
DRAPE WARM FLUID 44X44 (DRAPE) ×3 IMPLANT
DRESSING TELFA 8X3 (GAUZE/BANDAGES/DRESSINGS) IMPLANT
ELECT REM PT RETURN 9FT ADLT (ELECTROSURGICAL) ×3
ELECTRODE REM PT RTRN 9FT ADLT (ELECTROSURGICAL) ×2 IMPLANT
FILTER SMOKE EVAC LAPAROSHD (FILTER) IMPLANT
GLOVE SURG SIGNA 7.5 PF LTX (GLOVE) ×6 IMPLANT
GOWN STRL NON-REIN LRG LVL3 (GOWN DISPOSABLE) ×3 IMPLANT
GOWN STRL REIN XL XLG (GOWN DISPOSABLE) ×6 IMPLANT
KIT BASIN OR (CUSTOM PROCEDURE TRAY) ×3 IMPLANT
LEGGING LITHOTOMY PAIR STRL (DRAPES) IMPLANT
LIGASURE IMPACT 36 18CM CVD LR (INSTRUMENTS) IMPLANT
NS IRRIG 1000ML POUR BTL (IV SOLUTION) ×3 IMPLANT
PENCIL BUTTON HOLSTER BLD 10FT (ELECTRODE) ×3 IMPLANT
RELOAD 45 VASCULAR/THIN (ENDOMECHANICALS) IMPLANT
RELOAD BLUE (STAPLE) ×3 IMPLANT
RELOAD STAPLE 45 2.5 WHT GRN (ENDOMECHANICALS) IMPLANT
RELOAD STAPLE 45 3.5 BLU ETS (ENDOMECHANICALS) IMPLANT
RELOAD STAPLE TA45 3.5 REG BLU (ENDOMECHANICALS) IMPLANT
RETRACTOR WND ALEXIS 18 MED (MISCELLANEOUS) IMPLANT
RTRCTR WOUND ALEXIS 18CM MED (MISCELLANEOUS)
SCALPEL HARMONIC ACE (MISCELLANEOUS) ×2 IMPLANT
SET IRRIG TUBING LAPAROSCOPIC (IRRIGATION / IRRIGATOR) ×2 IMPLANT
SOLUTION ANTI FOG 6CC (MISCELLANEOUS) ×3 IMPLANT
SPONGE GAUZE 4X4 12PLY (GAUZE/BANDAGES/DRESSINGS) ×3 IMPLANT
SPONGE LAP 18X18 X RAY DECT (DISPOSABLE) ×6 IMPLANT
STAPLE ECHEON FLEX 60 POW ENDO (STAPLE) IMPLANT
STAPLER VISISTAT 35W (STAPLE) ×2 IMPLANT
STRIP CLOSURE SKIN 1/2X4 (GAUZE/BANDAGES/DRESSINGS) IMPLANT
SUCTION POOLE TIP (SUCTIONS) ×3 IMPLANT
SUT MON AB 5-0 PS2 18 (SUTURE) ×3 IMPLANT
SUT NOVA NAB DX-16 0-1 5-0 T12 (SUTURE) IMPLANT
SUT PROLENE 2 0 KS (SUTURE) IMPLANT
SUT PROLENE 2 0 SH DA (SUTURE) IMPLANT
SUT SILK 2 0 (SUTURE) ×3
SUT SILK 2 0 SH CR/8 (SUTURE) IMPLANT
SUT SILK 2-0 18XBRD TIE 12 (SUTURE) ×2 IMPLANT
SUT SILK 3 0 (SUTURE)
SUT SILK 3 0 SH CR/8 (SUTURE) IMPLANT
SUT SILK 3-0 18XBRD TIE 12 (SUTURE) IMPLANT
SUT VIC AB 5-0 P-3 18XBRD (SUTURE) ×1 IMPLANT
SUT VIC AB 5-0 P3 18 (SUTURE) ×3
SUT VICRYL 0 UR6 27IN ABS (SUTURE) ×2 IMPLANT
SYR BULB IRRIGATION 50ML (SYRINGE) ×3 IMPLANT
TOWEL OR 17X26 10 PK STRL BLUE (TOWEL DISPOSABLE) ×3 IMPLANT
TRAY FOLEY CATH 14FRSI W/METER (CATHETERS) ×3 IMPLANT
TRAY LAP CHOLE (CUSTOM PROCEDURE TRAY) ×3 IMPLANT
TROCAR BLADELESS OPT 5 75 (ENDOMECHANICALS) ×5 IMPLANT
TROCAR XCEL 12X100 BLDLESS (ENDOMECHANICALS) IMPLANT
TROCAR XCEL BLUNT TIP 100MML (ENDOMECHANICALS) ×2 IMPLANT
TROCAR XCEL NON-BLD 11X100MML (ENDOMECHANICALS) IMPLANT
TUBING FILTER THERMOFLATOR (ELECTROSURGICAL) ×3 IMPLANT
YANKAUER SUCT BULB TIP 10FT TU (MISCELLANEOUS) ×3 IMPLANT
YANKAUER SUCT BULB TIP NO VENT (SUCTIONS) ×3 IMPLANT

## 2011-08-10 NOTE — Brief Op Note (Signed)
08/06/2011 - 08/10/2011  3:52 PM  PATIENT:  [name], 41 y.o., female, MRN: [id number]  PREOP DIAGNOSIS:  small bowel obstruction  POSTOP DIAGNOSIS:   Small bowel obstruction due to adhesive band in pelvis.  Right ovarian cyst (4 cm)  PROCEDURE:   Procedure(s): LAPAROSCOPIC LYSIS OF ADHESIONS  SURGEON:   Ovidio Kin, M.D.  ASSISTANT:   none  ANESTHESIA:   general  Illene Silver, CRNA - CRNA Randon Goldsmith - CRNA  General  EBL:  minimal  ml  BLOOD ADMINISTERED: none  DRAINS: none   LOCAL MEDICATIONS USED:   30 cc 1/4 % marcaine  SPECIMEN:   none  COUNTS CORRECT:  YES  INDICATIONS FOR PROCEDURE:  [name] is a 41 y.o. (DOB: January 11, 1971) white female whose primary care physician is Nadean Corwin, MD, MD and comes for laparoscopic exploration for small bowel obstruction.   The indications and risks of the surgery were explained to the patient.  The risks include, but are not limited to, infection, bleeding, and nerve injury.  Note dictated to:   #[id number]

## 2011-08-10 NOTE — Transfer of Care (Signed)
Immediate Anesthesia Transfer of Care Note  Patient: [name]  Procedure(s) Performed: Procedure(s) (LRB): LAPAROSCOPIC LYSIS OF ADHESIONS (N/A)  Patient Location: PACU  Anesthesia Type: General  Level of Consciousness: patient cooperative, lethargic and responds to stimulation  Airway & Oxygen Therapy: Patient Spontanous Breathing and Patient connected to face mask oxygen  Post-op Assessment: Report given to PACU RN, Post -op Vital signs reviewed and stable and Patient moving all extremities  Post vital signs: Reviewed and stable  Complications: No apparent anesthesia complications

## 2011-08-10 NOTE — Progress Notes (Signed)
Subjective: Still no flatus, feels the urge to have BM but nothing so far.  Objective: Vital signs in last 24 hours: Temp:  [98.8 F (37.1 C)-99.2 F (37.3 C)] 99.2 F (37.3 C) (05/30 0614) Pulse Rate:  [94-114] 114  (05/30 0614) Resp:  [18] 18  (05/30 0614) BP: (124-129)/(84-90) 129/90 mmHg (05/30 0614) SpO2:  [96 %-100 %] 96 % (05/30 0614) Weight:  [53.978 kg (119 lb)] 53.978 kg (119 lb) (05/30 0614) Last BM Date: 08/06/11  NG 2300 ml yesterday. No BM recorded. Tm99.2 VSS, labs OK, film not up yet, she may have more gas in lower colon than before.  Intake/Output from previous day: August 29, 2022 0701 - 05/30 0700 In: 4825.7 [I.V.:4425.7; NG/GT:400] Out: 4400 [Urine:2100; Emesis/NG output:2300] Intake/Output this shift:    General appearance: alert, cooperative and no distress Resp: clear to auscultation bilaterally GI: soft, not tender, minimal distension, no flatus/BM  Lab Results:   Basename 08/10/11 0353 08-29-11 0336  WBC 8.1 8.8  HGB 12.6 12.7  HCT 38.1 38.0  PLT 217 220    BMET  Basename 08/10/11 0353 2011-08-29 0336  NA 139 138  K 4.2 4.2  CL 106 106  CO2 15* 17*  GLUCOSE 62* 61*  BUN 9 12  CREATININE 0.45* 0.47*  CALCIUM 8.6 8.6   PT/INR No results found for this basename: LABPROT:2,INR:2 in the last 72 hours   Lab 08/06/11 1727  AST 16  ALT 26  ALKPHOS 88  BILITOT 0.6  PROT 8.0  ALBUMIN 4.7     Lipase     Component Value Date/Time   LIPASE 18 08/06/2011 1727     Studies/Results: Dg Abd 2 Views  Aug 29, 2011  *RADIOLOGY REPORT*  Clinical Data: Small bowel obstruction, follow-up  ABDOMEN - 2 VIEW  Comparison: Abdomen films of 08/08/2011  Findings: Supine and erect views of the abdomen show a relative paucity of bowel gas throughout the abdomen.  There is still some gaseous distention of small bowel loops measuring up to 3.8 cm with a few air-fluid levels consistent with a persistent partial small bowel obstruction.  The tip of the NG tube tip is  in the antrum of the stomach.  No free air is seen. Very little colonic bowel gas is seen.  IMPRESSION:    Persistently slightly dilated loops of small bowel with air-fluid levels consistent with persistent partial small bowel obstruction.  Original Report Authenticated By: Juline Patch, M.D.   Dg Abd 2 Views  08/08/2011  *RADIOLOGY REPORT*  Clinical Data: History of small bowel obstruction.  Abdominal distension.  ABDOMEN - 2 VIEW  Comparison: Abdominal radiograph 08/06/2011.  Findings: A nasogastric tube is seen with tip and side port in the stomach.  Surgical clips project over the right upper quadrant of the abdomen, consistent with prior cholecystectomy.  There are several borderline dilated and mildly dilated (up to 3.1 cm in diameter) gas-filled loops of small bowel in the abdomen, and several small air fluid levels are noted on the upright view. Today's study does demonstrate some gas and stool in the colon, however, there is no definite distal colonic or rectal gas identified.  No gross evidence of pneumoperitoneum at this time.  IMPRESSION: 1.  Nonspecific bowel gas pattern, as above, which remains concerning for a partial small bowel obstruction. 2.  No gross pneumoperitoneum at this time. 3.  Postoperative changes and support apparatus, as above.  Original Report Authenticated By: Florencia Reasons, M.D.    Medications:    .  enoxaparin  40 mg Subcutaneous Q24H  . ketorolac  30 mg Intravenous Once  . ketorolac  30 mg Intravenous Once    Assessment/Plan Post op SBO s/p cholecystectomy for Biliary dyskinsia 07/27/11  Patient Active Problem List   Diagnoses   .  Asthma   .  IBS (irritable bowel syndrome)   .  Hyperlipidemia   .  Renal stone   .  Dyspepsia/Gerd   .  Epigastric pain   .  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    Plan:  Review film with Dr. Ezzard Standing.  Continue NG drainage for now.    LOS: 4 days   JENNINGS,WILLARD 08/10/2011  Patient is not  progressing and KUB does not show any improvement.  I think we have tried medical management without improvement and she would be best served to go ahead with laparoscopic exploration.  I have discussed this thoroughly with the patient several times.  Her mother in law and sister in law are in the room.  For surgery later today.  Ovidio Kin, MD, [facility] Surgery Pager: [phone] Office phone:  [phone]

## 2011-08-10 NOTE — Anesthesia Preprocedure Evaluation (Addendum)
Anesthesia Evaluation  Patient identified by MRN, date of birth, ID band Patient awake    Reviewed: Allergy & Precautions, H&P , NPO status , Patient's Chart, lab work & pertinent test results  Airway Mallampati: II TM Distance: >3 FB Neck ROM: full    Dental No notable dental hx. (+) Teeth Intact and Dental Advisory Given   Pulmonary asthma ,  Asthma mild and only with URTI breath sounds clear to auscultation  Pulmonary exam normal       Cardiovascular Exercise Tolerance: Good negative cardio ROS  Rhythm:regular Rate:Normal     Neuro/Psych negative neurological ROS  negative psych ROS   GI/Hepatic Neg liver ROS, GERD-  Medicated and Controlled,  Endo/Other  negative endocrine ROS  Renal/GU negative Renal ROS  negative genitourinary   Musculoskeletal   Abdominal   Peds  Hematology negative hematology ROS (+)   Anesthesia Other Findings   Reproductive/Obstetrics negative OB ROS                           Anesthesia Physical Anesthesia Plan  ASA: II  Anesthesia Plan: General   Post-op Pain Management:    Induction: Intravenous, Rapid sequence and Cricoid pressure planned  Airway Management Planned: Oral ETT  Additional Equipment:   Intra-op Plan:   Post-operative Plan: Extubation in OR  Informed Consent: I have reviewed the patients History and Physical, chart, labs and discussed the procedure including the risks, benefits and alternatives for the proposed anesthesia with the patient or authorized representative who has indicated his/her understanding and acceptance.   Dental advisory given  Plan Discussed with: CRNA  Anesthesia Plan Comments:        Anesthesia Quick Evaluation

## 2011-08-10 NOTE — Anesthesia Postprocedure Evaluation (Signed)
Anesthesia Post Note  Patient: [name]  Procedure(s) Performed: Procedure(s) (LRB): LAPAROSCOPIC LYSIS OF ADHESIONS (N/A)  Anesthesia type: General  Patient location: PACU  Post pain: Pain level controlled  Post assessment: Post-op Vital signs reviewed  Last Vitals:  Filed Vitals:   08/10/11 1630  BP: 136/90  Pulse: 102  Temp:   Resp: 11    Post vital signs: Reviewed  Level of consciousness: sedated  Complications: No apparent anesthesia complications

## 2011-08-11 MED FILL — Acetaminophen IV Soln 10 MG/ML: INTRAVENOUS | Qty: 100 | Status: AC

## 2011-08-11 NOTE — Op Note (Signed)
[name], [name]                 ACCOUNT NO.:  [id number]  MEDICAL RECORD NO.:  [id number]  LOCATION:  1532                         FACILITY:  [facility]  PHYSICIAN:  Sandria Bales. Ezzard Standing, M.D.  DATE OF BIRTH:  [birth year]  DATE OF PROCEDURE:  08/10/2011                              OPERATIVE REPORT  PREOPERATIVE DIAGNOSIS:  Small-bowel obstruction.  POSTOPERATIVE DIAGNOSES:  Small-bowel obstruction due to adhesive band In the pelvis.  A right ovarian cyst, approximately 4 cm in size.  PROCEDURE:  Laparoscopic enterolysis of adhesions.  SURGEON:  Sandria Bales. Ezzard Standing, M.D.  FIRST ASSISTANT:  None.  ANESTHESIA:  General endotracheal.  ESTIMATED BLOOD LOSS:  Minimal.  Local anesthetic was 30 cc of 0.25% Marcaine.  COMPLICATIONS:  None.  INDICATION FOR PROCEDURE:  [name] is a 41 year old white female, who is a patient of Lucky Cowboy, whom I did a laparoscopic cholecystectomy on Jul 27, 2011.  She did well until about 4 or 5 days ago, when she presented with a partial bowel obstruction.  At first, it is unclear whether the bowel obstruction was due to a recent surgery or not.  We tried to place an NG tube to medically treat the obstruction, but this did not work.  So she now comes for abdominal exploration for the small-bowel obstruction.  I discussed with her the indication, potential complications of surgery. Potential complications include, but not limited to bleeding, infection, bowel injury, resection of bowel, and the possibility of open surgery.  OPERATIVE NOTE:  The patient was placed in a supine position in room #11 under general endotracheal anesthetic supervised by Dr. Lucille Passy, her arms were both packed, her abdomen prepped with ChloraPrep, sterilely draped.    A time-out was held and the surgical checklist run.  I went through an infraumbilical incision with sharp dissection carried down the abdominal cavity.  I had to go through her recent incision, which  actually was sealed together much better than I anticipated this recently after surgery.    I got the zero-degree endoscope through a 12-mm Hasson trocar and the Hasson trocar was secured with a 0 Vicryl suture. I placed 2 additional 5 mm trocars, 1 in the left lower quadrant and 1 in the left upper quadrant.  I carried out abdominal exploration.  Her right and left lobes of liver were unremarkable.  I could actually see the gallbladder bed and that looked good with no adhesions.  She did have dilated small bowel.  So I started at the terminal ileum and ran the small bowel backwards.  Within about 8 or 10 inches of the cecum, I found an adhesive band, which trapped 2 loops of small bowel. I used Harmonic scalpel to divide the adhesive band.  I then ran the bowel back to the dilated bowel and saw no other point of obstruction.  She did have some  interloop adhesions, which were minimal.  I do not think would impact recovery.    I think I found the cause of her obstruction and this was lysed.  Also of note, she has an approximately 4 cm right ovarian cyst.  This looks benign.  I took photos of  this.  I gave the pictures to her husband.  He said she was aware of this cyst.  I recommend followup with her GYN doctor.  I then irrigated the abdomen with about 300 cc of saline.  Again rechecked the areas, which I had divided the adhesive band and there was no evidence of any transluminal bowel injury.  The scope was withdrew from the umbilicus.  I closed the umbilical incision with 0 Vicryl suture x2, and I removed the 5 mm trocars.  Closed all the wounds with 5- 0 Vicryl suture, painted with Dermabond.  The patient was transferred to recovery room in good condition.  Sponge and needle count were correct at the end of the case.   Sandria Bales. Ezzard Standing, M.D., FACS   DHN/MEDQ  D:  08/10/2011  T:  08/11/2011  Job:  [id number]  cc:   Erick Blinks, MD Fax: [phone]  Lucky Cowboy, M.D. Fax:  [phone]

## 2011-08-11 NOTE — Progress Notes (Signed)
UR complete 

## 2011-08-11 NOTE — Progress Notes (Signed)
1 Day Post-Op  Subjective: Feels much better, no nausea with the tube out.  No flatus  Objective: Vital signs in last 24 hours: Temp:  [97.9 F (36.6 C)-98.7 F (37.1 C)] 98.5 F (36.9 C) (05/31 0539) Pulse Rate:  [83-130] 102  (05/31 0539) Resp:  [11-18] 16  (05/31 0539) BP: (112-139)/(80-92) 122/81 mmHg (05/31 0539) SpO2:  [99 %-100 %] 100 % (05/31 0539) Last BM Date: 08/06/11  Afebrile, VSS, no labs,  Intake/Output from previous day: Aug 29, 2022 0701 - 05/31 0700 In: 2706.3 [I.V.:2706.3] Out: 2500 [Urine:2400; Blood:100] Intake/Output this shift: Total I/O In: 1985.4 [I.V.:1985.4] Out: 600 [Urine:600]  General appearance: alert, cooperative and no distress Resp: clear to auscultation bilaterally GI: soft, not distended, incisions ok, BS hypoactive.  no flatus  Lab Results:   Basename 08/29/11 0353 08/09/11 0336  WBC 8.1 8.8  HGB 12.6 12.7  HCT 38.1 38.0  PLT 217 220    BMET  Basename 2011/08/29 0353 08/09/11 0336  NA 139 138  K 4.2 4.2  CL 106 106  CO2 15* 17*  GLUCOSE 62* 61*  BUN 9 12  CREATININE 0.45* 0.47*  CALCIUM 8.6 8.6   PT/INR No results found for this basename: LABPROT:2,INR:2 in the last 72 hours   Lab 08/06/11 1727  AST 16  ALT 26  ALKPHOS 88  BILITOT 0.6  PROT 8.0  ALBUMIN 4.7     Lipase     Component Value Date/Time   LIPASE 18 08/06/2011 1727     Studies/Results: Dg Abd 2 Views  08-29-11  *RADIOLOGY REPORT*  Clinical Data: Small bowel obstruction.  Mid abdominal tenderness.  ABDOMEN - 2 VIEW  Comparison: 08/09/2011.  Findings: Nasogastric tube terminates in the stomach.  There are a few dilated loops of small bowel with associated air fluid levels in the lower central abdomen.  Minimal colonic gas.  IMPRESSION: Persistent mild small bowel dilatation with minimal colonic gas, indicative of a partial or mild small bowel obstruction.  Original Report Authenticated By: Reyes Ivan, M.D.    Medications:    . enoxaparin  30 mg  Subcutaneous Q24H  . fentaNYL      . DISCONTD: enoxaparin  40 mg Subcutaneous Q24H    Assessment/Plan Small bowel obstruction due to adhesive band in pelvis. Right ovarian cyst (4 cm) s/p LAPAROSCOPIC LYSIS OF ADHESIONS Post op SBO s/p cholecystectomy for Biliary dyskinsia 07/27/11  Patient Active Problem List   Diagnoses   .  Asthma   .  IBS (irritable bowel syndrome)   .  Hyperlipidemia   .  Renal stone   .  Dyspepsia/Gerd   .  Epigastric pain   .  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    Plan: start some clears and see how she does.     LOS: 5 days    [name],WILLARD 08/11/2011  Walking hall with husband.  Feels better, but has not turned corner.  She still has no BM or gas.  I am not sure she will be ready to go home tomorrow.  Ovidio Kin, MD, [facility] Pager: [phone] Office phone:  [phone]

## 2011-08-12 MED ORDER — ACETAMINOPHEN 325 MG PO TABS
650.0000 mg | ORAL_TABLET | ORAL | Status: DC | PRN
  Administered 2011-08-12: 650 mg via ORAL
  Filled 2011-08-12: qty 2

## 2011-08-12 MED ORDER — GUAIFENESIN ER 600 MG PO TB12
600.0000 mg | ORAL_TABLET | Freq: Two times a day (BID) | ORAL | Status: DC
Start: 2011-08-12 — End: 2011-08-13
  Administered 2011-08-12: 600 mg via ORAL
  Filled 2011-08-12 (×3): qty 1

## 2011-08-12 NOTE — Progress Notes (Signed)
Patient ID: [name], female   DOB: 1970/04/10, 41 y.o.   MRN: [id number] Subjective: No complaints  POD#2 +flatus and liquid BM Tolerating liquids  Objective: Physical Exam: BP 125/85  Pulse 86  Temp(Src) 98.2 F (36.8 C) (Oral)  Resp 16  Ht 5\' 4"  (1.626 m)  Wt 119 lb (53.978 kg)  BMI 20.43 kg/m2  SpO2 100%  Abdomen soft, minimally full Incisions clean    Labs: CBC  Basename 08/22/2011 0353  WBC 8.1  HGB 12.6  HCT 38.1  PLT 217   BMET  Basename August 22, 2011 0353  NA 139  K 4.2  CL 106  CO2 15*  GLUCOSE 62*  BUN 9  CREATININE 0.45*  CALCIUM 8.6   LFT No results found for this basename: PROT,ALBUMIN,AST,ALT,ALKPHOS,BILITOT,BILIDIR,IBILI,LIPASE in the last 72 hours PT/INR No results found for this basename: LABPROT:2,INR:2 in the last 72 hours   Studies/Results: Dg Abd 2 Views  08-22-2011  *RADIOLOGY REPORT*  Clinical Data: Small bowel obstruction.  Mid abdominal tenderness.  ABDOMEN - 2 VIEW  Comparison: 08/09/2011.  Findings: Nasogastric tube terminates in the stomach.  There are a few dilated loops of small bowel with associated air fluid levels in the lower central abdomen.  Minimal colonic gas.  IMPRESSION: Persistent mild small bowel dilatation with minimal colonic gas, indicative of a partial or mild small bowel obstruction.  Original Report Authenticated By: Reyes Ivan, M.D.    Assessment/Plan: S/p lap lysis of adhesions  Decrease IVF Full liquid diet    LOS: 6 days    Joevon Holliman A MD 08/12/2011 7:47 AM

## 2011-08-13 NOTE — Progress Notes (Signed)
Pt discharged to home with mother provided discharge instructions along with copy. No prescriptions provided. Pt verbalized understanding of discharge information. Pt stable. Pt transported by tech Asher Muir IV removed and documented. Annitta Needs, RN

## 2011-08-13 NOTE — Discharge Instructions (Signed)
[facility] ______[facility] °LAPAROSCOPIC SURGERY: POST OP INSTRUCTIONS °Always review your discharge instruction sheet given to you by the facility where your surgery was performed. °IF YOU HAVE DISABILITY OR FAMILY LEAVE FORMS, YOU MUST BRING THEM TO THE OFFICE FOR PROCESSING.   °DO NOT GIVE THEM TO YOUR DOCTOR. ° °1. A prescription for pain medication may be given to you upon discharge.  Take your pain medication as prescribed, if needed.  If narcotic pain medicine is not needed, then you may take acetaminophen (Tylenol) or ibuprofen (Advil) as needed. °2. Take your usually prescribed medications unless otherwise directed. °3. If you need a refill on your pain medication, please contact your pharmacy.  They will contact our office to request authorization. Prescriptions will not be filled after 5pm or on week-ends. °4. You should follow a light diet the first few days after arrival home, such as soup and crackers, etc.  Be sure to include lots of fluids daily. °5. Most patients will experience some swelling and bruising in the area of the incisions.  Ice packs will help.  Swelling and bruising can take several days to resolve.  °6. It is common to experience some constipation if taking pain medication after surgery.  Increasing fluid intake and taking a stool softener (such as Colace) will usually help or prevent this problem from occurring.  A mild laxative (Milk of Magnesia or Miralax) should be taken according to package instructions if there are no bowel movements after 48 hours. °7. Unless discharge instructions indicate otherwise, you may remove your bandages 24-48 hours after surgery, and you may shower at that time.  You may have steri-strips (small skin tapes) in place directly over the incision.  These strips should be left on the skin for 7-10 days.  If your surgeon used skin glue on the incision, you may shower in 24 hours.  The glue will flake off over the next 2-3 weeks.  Any sutures or  staples will be removed at the office during your follow-up visit. °8. ACTIVITIES:  You may resume regular (light) daily activities beginning the next day--such as daily self-care, walking, climbing stairs--gradually increasing activities as tolerated.  You may have sexual intercourse when it is comfortable.  Refrain from any heavy lifting or straining until approved by your doctor. °a. You may drive when you are no longer taking prescription pain medication, you can comfortably wear a seatbelt, and you can safely maneuver your car and apply brakes. °b. RETURN TO WORK:  __________________________________________________________ °9. You should see your doctor in the office for a follow-up appointment approximately 2-3 weeks after your surgery.  Make sure that you call for this appointment within a day or two after you arrive home to insure a convenient appointment time. °10. OTHER INSTRUCTIONS: __________________________________________________________________________________________________________________________ __________________________________________________________________________________________________________________________ °WHEN TO CALL YOUR DOCTOR: °1. Fever over 101.0 °2. Inability to urinate °3. Continued bleeding from incision. °4. Increased pain, redness, or drainage from the incision. °5. Increasing abdominal pain ° °The clinic staff is available to answer your questions during regular business hours.  Please don’t hesitate to call and ask to speak to one of the nurses for clinical concerns.  If you have a medical emergency, go to the nearest emergency room or call 911.  A surgeon from [facility] is always on call at the hospital. °[address], [address], [place], [place]  [address] ? [address] [address], [place], [place]   [address] °[phone] ? [phone] ? FAX [phone] °Web site:   [email or web address] °

## 2011-08-13 NOTE — Progress Notes (Signed)
Patient ID: [name], female   DOB: Feb 08, 1971, 41 y.o.   MRN: [id number] Doing well  +BM, no abdominal pain or nausea  Abdomen soft, Non distended, incision clean  Plan: discharge

## 2011-08-18 ENCOUNTER — Ambulatory Visit (INDEPENDENT_AMBULATORY_CARE_PROVIDER_SITE_OTHER): Payer: BC Managed Care – PPO | Admitting: Surgery

## 2011-08-18 ENCOUNTER — Encounter (INDEPENDENT_AMBULATORY_CARE_PROVIDER_SITE_OTHER): Payer: Self-pay | Admitting: Surgery

## 2011-08-18 VITALS — BP 101/62 | HR 80 | Temp 99.2°F | Resp 14 | Ht 64.0 in | Wt 119.6 lb

## 2011-08-18 DIAGNOSIS — K829 Disease of gallbladder, unspecified: Secondary | ICD-10-CM

## 2011-08-18 NOTE — Progress Notes (Signed)
.  [facility]  Ovidio Kin, MD,  FACS [address] [place]  [address] [place], [place]    [address] Phone:  [phone] FAX:  [phone]   Re:   [name] DOB:   Apr 01, 1970 MRN:   [id number]  ASSESSMENT AND PLAN: 1. Biliary dyskinesia - final path showed chronic cholecystitis.  Lap chole -  07/27/2011 - D. Georgie Eduardo.  Rash around the right subcostal incisions - ? Fungal.  I told her to stop the topical antibiotics and possibly use an antifungal cream.  Otherwise doing well.  I wrote note to be out of work till 08/29/2011.   Return appt PRN.  2.  SBO requiring laparoscopic enterolysis - D. Mayana Irigoyen - 08/10/2011 3.  Right ovarian cyst.  She is aware of the right ovarian cyst. 4.  Asthma, minimal  5.  Hyperlipidemia.  6.  Asymptomatic right kidney stone. 7.  Continued trouble with constipation.  This has been a chronic problem.  She is going to see Dr. Oneta Rack about this.   HISTORY OF PRESENT ILLNESS: Chief Complaint  Patient presents with  . Follow-up    [name] is a 41 y.o. (DOB: 02/06/1971)  white female who is a patient of MCKEOWN,WILLIAM Jennifer Cunanan, MD, MD and comes to me today for follow up of 2 different operations, lap chole and enterolysis for SBO.  She did well from her gall bladder surgery.  Then she had an unrelated bowel obstruction secondary to an adhesive band in her pelvis from prior gynecologic surgery.  She is doing well except for:  1.) Rash around the right subcostal incisions - ? Fungal.  I told her to stop the topical antibiotics and possibly use an antifungal cream. 2.) Continued constipation.  She is going to the beach in a week.  I told her to stay out of the water for a total of 3 weeks from the surgery.  PHYSICAL EXAM: BP 101/62  Pulse 80  Temp(Src) 99.2 F (37.3 C) (Temporal)  Resp 14  Ht 5\' 4"  (1.626 m)  Wt 119 lb 9.6 oz (54.25 kg)  BMI 20.53 kg/m2  General: WN WF who is alert and generally healthy appearing.  HEENT:  Normal. Pupils equal. Good dentition. Abdomen: Soft. No mass. Incisions look okay.  She has a rash around the right subcostal incisions.  This may be fungal.  DATA REVIEWED: Path to patient.  Ovidio Kin, MD, FACS Office:  [phone]

## 2011-08-21 NOTE — Discharge Summary (Signed)
NAMEMIRELA, [name]                 ACCOUNT NO.:  [id number]  MEDICAL RECORD NO.:  [id number]  LOCATION:  1532                         FACILITY:  [facility]  PHYSICIAN:  Sandria Bales. Ezzard Standing, M.D.  DATE OF BIRTH:  06-Sep-1970  DATE OF ADMISSION:  08/07/2011 DATE OF DISCHARGE:  08/13/2011                              DISCHARGE SUMMARY   ISCHARGE DIAGNOSES: 1. Small bowel obstruction secondary to adhesive band in pelvis. 2. Recent laparoscopic cholecystectomy for biliary dyskinesia on Jul 27, 2011. 3. Right ovarian cyst, which she has known about for some time. 4. Asthma. 5. Hyperlipidemia. 6. Asymptomatic right kidney stone. 7. History of chronic constipation.  OPERATIONS PERFORMED:  The patient underwent a laparoscopic enterolysis of adhesions by Dr. Ovidio Kin on Aug 10, 2011.  HISTORY:  [name] is a 41 year old white female, who is a patient of Dr. Marlowe Shores, whom I did a laparoscopic cholecystectomy for biliary dyskinesia on Jul 27, 2011.  She did well and went home with an uneventful course for about 1 week.  She then developed increasing abdominal distention and re-presented to [facility] on Aug 07, 2011.  She was admitted by Dr. Lodema Pilot for possible bowel obstruction.  It was unclear early if this was related to her gallbladder surgery or unrelated.  COMORBID PROBLEMS:  Included, 1. She had a history of some asthma. 2. She has a history of a hyperlipidemia. 3. She had asymptomatic right kidney stone. 4. She has had a history of constipation for much of her adult life.  HOSPITAL COURSE:  She had an NG tube placed and received IV fluids and medical management of her bowel obstruction.  After about 2 days, she clearly was not improving, so on Aug 09, 2008, I discussed with her about proceeding with surgery.  On Aug 10, 2011, she was taken to the operating room, where she underwent a laparoscopic enterolysis of adhesions.  She had an adhesive band from a previous  pelvic surgery with a right lower quadrant and this was the source of her obstruction.  Also noted about a 4 cm right ovarian cyst.  She had been followed for an ovarian cyst, which seems to come and go for some time.  Postoperatively, she did well and was started on liquids the day after surgery, which is a little slow to have it up above.  On August 13, 2011, she was ready for discharge.  DISCHARGE INSTRUCTIONS:  Slowly advanced her diet.   She is to do noheavy lifting for about a week.    She is to see me back in the office, and call and make an appointment in about one week. We will keep her out of work at least until I saw her back in the office.   I discussed with her a right ovarian cyst, which again she says had been followed before.  Her discharge condition is good.   Sandria Bales. Ezzard Standing, M.D., FACS   DHN/MEDQ  D:  08/20/2011  T:  08/21/2011  Job:  [id number]  cc:   Lucky Cowboy, M.D. Fax: [phone]  Erick Blinks, MD Fax: [phone]

## 2011-08-23 ENCOUNTER — Encounter (INDEPENDENT_AMBULATORY_CARE_PROVIDER_SITE_OTHER): Payer: BC Managed Care – PPO | Admitting: Surgery

## 2012-01-30 ENCOUNTER — Other Ambulatory Visit (HOSPITAL_COMMUNITY): Payer: Self-pay | Admitting: Internal Medicine

## 2012-01-30 ENCOUNTER — Ambulatory Visit (HOSPITAL_COMMUNITY)
Admission: RE | Admit: 2012-01-30 | Discharge: 2012-01-30 | Disposition: A | Discharge status: Home / Self Care | Payer: BC Managed Care – PPO | Source: Ambulatory Visit | Attending: Internal Medicine | Admitting: Internal Medicine

## 2012-01-30 DIAGNOSIS — M545 Low back pain, unspecified: Secondary | ICD-10-CM | POA: Insufficient documentation

## 2012-10-29 ENCOUNTER — Other Ambulatory Visit (HOSPITAL_COMMUNITY): Payer: Self-pay | Admitting: Internal Medicine

## 2012-10-29 ENCOUNTER — Ambulatory Visit (HOSPITAL_COMMUNITY)
Admission: RE | Admit: 2012-10-29 | Discharge: 2012-10-29 | Disposition: A | Discharge status: Home / Self Care | Payer: BC Managed Care – PPO | Source: Ambulatory Visit | Attending: Internal Medicine | Admitting: Internal Medicine

## 2012-10-29 DIAGNOSIS — Z9089 Acquired absence of other organs: Secondary | ICD-10-CM | POA: Insufficient documentation

## 2012-10-29 DIAGNOSIS — K5900 Constipation, unspecified: Secondary | ICD-10-CM | POA: Insufficient documentation

## 2012-10-29 DIAGNOSIS — R141 Gas pain: Secondary | ICD-10-CM | POA: Insufficient documentation

## 2012-10-29 DIAGNOSIS — R142 Eructation: Secondary | ICD-10-CM | POA: Insufficient documentation

## 2012-10-29 DIAGNOSIS — K219 Gastro-esophageal reflux disease without esophagitis: Secondary | ICD-10-CM | POA: Insufficient documentation

## 2012-10-29 DIAGNOSIS — K589 Irritable bowel syndrome without diarrhea: Secondary | ICD-10-CM | POA: Insufficient documentation

## 2012-11-18 ENCOUNTER — Encounter: Payer: BC Managed Care – PPO | Attending: Family Medicine | Admitting: *Deleted

## 2012-11-18 ENCOUNTER — Encounter: Payer: Self-pay | Admitting: *Deleted

## 2012-11-18 DIAGNOSIS — Z713 Dietary counseling and surveillance: Secondary | ICD-10-CM | POA: Insufficient documentation

## 2012-11-18 DIAGNOSIS — E785 Hyperlipidemia, unspecified: Secondary | ICD-10-CM

## 2012-11-18 DIAGNOSIS — K589 Irritable bowel syndrome without diarrhea: Secondary | ICD-10-CM

## 2012-11-18 NOTE — Progress Notes (Signed)
  Medical Nutrition Therapy:  Appt start time: 0900 end time:  1000.  Assessment:  Primary concerns today: [name] is here for nutrition counseling pertaining to elevated cholesterol levels and IBS.  She has texture aversions to many whole fruits and vegetables and is having a hard time getting nutritious foods in her diet without bothering her IBS or her palate.  She works as a Insurance account manager at [employer] and often doesn't have time to snack or eating healthy meals. She likes to be physically active, but often doesn't feel like she has time.  Her total cholesterol is 171 mg/dk; LDL is 102 mg/dl; HDL is 52 mg/dl.  She states these are the "healthiest" labs she's ever had. Marland Kitchen   MEDICATIONS: see list   DIETARY INTAKE:  Usual eating pattern includes 2-3 meals and 0-2 snacks per day.  Everyday foods include starches, proteins, yogurt, sometimes fruits ( in a smoothie) and vegetables.  Avoided foods include raw fruits and vegetables.    24-hr recall:  B ( AM): bagel sometimes.  Fruit smoothie with yogurt and juice .  [place] yogurt with granola Snk ( AM): maybe yogurt with granola or peanut; pretzels and hummus  L ( PM): leftovers, [employer]; [employer] grilled chicken Snk ( PM): not ususally D ( PM): maybe subway.  Maybe pretzels and hummus Snk ( PM): not usually.  Maybe pudding Beverages: water, sweet tea made with Splenda blend.  Rarely softdrinks.  Sometimes juice.  No alcohol  Usual physical activity: runs on treadmill and Hip Hop abs  Estimated energy needs: 1600-1800 calories 180 g carbohydrates 120 g protein 44 g fat  Progress Towards Goal(s):  In progress.   Nutritional Diagnosis:  NB-1.7 Undesireable food choices As related to texture aversions and IBS.  As evidenced by low fruit and vegetable consumption and chronic constipation.    Intervention:  Nutrition counseling provided.  Recommended checking hunger status every 2-3 hours to gauge if she needs a snack.  It's recommended to eat 5-6 small  meals throughout the day with IBS, but she gets so busy she delays eating for hours.  She needs to check in with herself and gauge if she's hungry or not. Encouraged her to eat without distractions: turn off tv, put away books, magazine, etc.  Aim to make meals last 20 minutes in order to give body time to register fullness cues.   Recommended walking after dinner on days when she isn't able to get a full workout in.  That will help increase her activity levels.  Suggested canned fruits and applesauces to increase her fruit consumption and recommended using slow cooker to prepare soups, stews, chilis etc. To increase her vegetable consumption and allow for leftovers so she doesn't have to get fast cook as often.   Recommended avoiding foods that cause flair ups, but to listen to her body.  Goals: Continue miralax.  Take qod and gauge tolerance Take walk after dinner on days not able to run Add canned fruits and cooked vegetables to diet, as tolerated    Monitoring/Evaluation:  Dietary intake, exercise, lab data, and GI symptoms prn.

## 2012-12-19 ENCOUNTER — Other Ambulatory Visit: Payer: Self-pay | Admitting: Emergency Medicine

## 2013-05-05 ENCOUNTER — Other Ambulatory Visit: Payer: Self-pay | Admitting: Emergency Medicine

## 2013-05-15 ENCOUNTER — Encounter: Payer: Self-pay | Admitting: Emergency Medicine

## 2013-05-15 ENCOUNTER — Ambulatory Visit (INDEPENDENT_AMBULATORY_CARE_PROVIDER_SITE_OTHER): Payer: BC Managed Care – PPO | Admitting: Emergency Medicine

## 2013-05-15 VITALS — BP 122/80 | HR 94 | Temp 98.6°F | Resp 18 | Ht 63.5 in | Wt 137.0 lb

## 2013-05-15 DIAGNOSIS — E782 Mixed hyperlipidemia: Secondary | ICD-10-CM

## 2013-05-15 DIAGNOSIS — B372 Candidiasis of skin and nail: Secondary | ICD-10-CM

## 2013-05-15 LAB — CBC WITH DIFFERENTIAL/PLATELET
BASOS PCT: 0 % (ref 0–1)
Basophils Absolute: 0 10*3/uL (ref 0.0–0.1)
EOS ABS: 0.1 10*3/uL (ref 0.0–0.7)
EOS PCT: 1 % (ref 0–5)
HCT: 40.1 % (ref 36.0–46.0)
Hemoglobin: 13.5 g/dL (ref 12.0–15.0)
LYMPHS ABS: 1.5 10*3/uL (ref 0.7–4.0)
Lymphocytes Relative: 21 % (ref 12–46)
MCH: 27.9 pg (ref 26.0–34.0)
MCHC: 33.7 g/dL (ref 30.0–36.0)
MCV: 82.9 fL (ref 78.0–100.0)
MONOS PCT: 6 % (ref 3–12)
Monocytes Absolute: 0.4 10*3/uL (ref 0.1–1.0)
NEUTROS PCT: 72 % (ref 43–77)
Neutro Abs: 5 10*3/uL (ref 1.7–7.7)
PLATELETS: 285 10*3/uL (ref 150–400)
RBC: 4.84 MIL/uL (ref 3.87–5.11)
RDW: 13.6 % (ref 11.5–15.5)
WBC: 7 10*3/uL (ref 4.0–10.5)

## 2013-05-15 LAB — HEPATIC FUNCTION PANEL
ALK PHOS: 68 U/L (ref 39–117)
ALT: 10 U/L (ref 0–35)
AST: 16 U/L (ref 0–37)
Albumin: 4.6 g/dL (ref 3.5–5.2)
BILIRUBIN DIRECT: 0.1 mg/dL (ref 0.0–0.3)
BILIRUBIN INDIRECT: 0.3 mg/dL (ref 0.2–1.2)
TOTAL PROTEIN: 6.5 g/dL (ref 6.0–8.3)
Total Bilirubin: 0.4 mg/dL (ref 0.2–1.2)

## 2013-05-15 LAB — LIPID PANEL
CHOLESTEROL: 183 mg/dL (ref 0–200)
HDL: 48 mg/dL (ref >39)
LDL Cholesterol: 110 mg/dL — ABNORMAL HIGH (ref 0–99)
TRIGLYCERIDES: 127 mg/dL (ref <150)
Total CHOL/HDL Ratio: 3.8 Ratio
VLDL: 25 mg/dL (ref 0–40)

## 2013-05-15 MED ORDER — NYSTATIN 100000 UNIT/GM EX POWD: CUTANEOUS | Status: DC

## 2013-05-15 MED ORDER — FLUCONAZOLE 150 MG PO TABS: 150.0000 mg | ORAL_TABLET | ORAL | Status: DC

## 2013-05-15 NOTE — Progress Notes (Signed)
Subjective:    Patient ID: [name], female    DOB: 03-Oct-1970, 43 y.o.   MRN: [id number]  HPI Comments: 43 yo with concerns of yeast infection on both breast. Diflucan helped some but still feels present. She is due for Mammo in 05/2103. She takes of sweaty clothes as soon as workout over and wears moisture wicking.    She did not f/u for cholesterol labs AD T 198 TTG 123 L 121. She has ben trying to eat better and exercising more.  Current Outpatient Prescriptions on File Prior to Visit  Medication Sig Dispense Refill  . aspirin 81 MG tablet Take 81 mg by mouth See admin instructions. Takes 1 tablet on mondays, wednesdays, and fridays      . gemfibrozil (LOPID) 600 MG tablet Take 600 mg by mouth 2 (two) times daily.       Marland Kitchen guaiFENesin (MUCINEX) 600 MG 12 hr tablet Take 600 mg by mouth 2 (two) times daily.       Marland Kitchen lubiprostone (AMITIZA) 8 MCG capsule Take 8 mcg by mouth 2 (two) times daily with a meal.      . meloxicam (MOBIC) 15 MG tablet Take 15 mg by mouth daily. Every other day      . minocycline (DYNACIN) 100 MG tablet Take 100 mg by mouth 2 (two) times daily.      . polyethylene glycol (MIRALAX / GLYCOLAX) packet Take 17 g by mouth daily.      . pseudoephedrine (SUDAFED) 30 MG tablet Take 30 mg by mouth every 4 (four) hours as needed. congestion       No current facility-administered medications on file prior to visit.   Allergies  Allergen Reactions  . Food     strawberries   Past Medical History  Diagnosis Date  . Asthma   . IBS (irritable bowel syndrome)   . HLD (hyperlipidemia)   . Elevated cholesterol   . Acne   . GERD (gastroesophageal reflux disease)   . Nausea   . Difficulty sleeping     due to discomfort  . Chronic kidney disease   . Arthritis        Review of Systems  Skin: Positive for rash.  All other systems reviewed and are negative.   BP 122/80  Pulse 94  Temp(Src) 98.6 F (37 C) (Temporal)  Resp 18  Ht 5' 3.5" (1.613 m)  Wt 137 lb  (62.143 kg)  BMI 23.88 kg/m2     Objective:   Physical Exam  Nursing note and vitals reviewed. Constitutional: She is oriented to person, place, and time. She appears well-developed and well-nourished. No distress.  HENT:  Head: Normocephalic and atraumatic.  Right Ear: External ear normal.  Left Ear: External ear normal.  Nose: Nose normal.  Mouth/Throat: Oropharynx is clear and moist.  Eyes: Conjunctivae and EOM are normal.  Neck: Normal range of motion. Neck supple. No JVD present. No thyromegaly present.  Cardiovascular: Normal rate, regular rhythm, normal heart sounds and intact distal pulses.   Pulmonary/Chest: Effort normal and breath sounds normal.  Abdominal: Soft. Bowel sounds are normal. She exhibits no distension and no mass. There is no tenderness. There is no rebound and no guarding.  Musculoskeletal: Normal range of motion. She exhibits no edema and no tenderness.  Lymphadenopathy:    She has no cervical adenopathy.  Neurological: She is alert and oriented to person, place, and time. No cranial nerve deficit.  Skin: Skin is warm and dry. No  rash noted. There is erythema. No pallor.  Minimal under each breast  Psychiatric: She has a normal mood and affect. Her behavior is normal. Judgment and thought content normal.          Assessment & Plan:  1. Cholesterol- recheck labs, Need to eat healthier and exercise AD. 2. ? Yeast- hygiene explained, Diflucan 150 mg AD, Nystatin Powder AD

## 2013-05-15 NOTE — Patient Instructions (Signed)
Fat and Cholesterol Control Diet Your diet has an affect on your fat and cholesterol levels in your blood and organs. Too much fat and cholesterol in your blood can affect your:  Heart.  Blood vessels (arteries, veins).  Gallbladder.  Liver.  Pancreas. CONTROL FAT AND CHOLESTEROL WITH DIET Certain foods raise cholesterol and others lower it. It is important to replace bad fats with other types of fat.  Do not eat:  Fatty meats, such as hot dogs and salami.  Stick margarine and some tub margarines that have "partially hydrogenated oils" in them.  Baked goods, such as cookies and crackers that have "partially hydrogenated oils" in them.  Saturated tropical oils, such as coconut and palm oil. Eat the following foods:  Round or loin cuts of red meat.  Chicken (without skin).  Fish.  Veal.  [place]ound [place] breast.  Shellfish.  Fruit, such as apples.  Vegetables, such as broccoli, potatoes, and carrots.  Beans, peas, and lentils (legumes).  Grains, such as barley, rice, couscous, and bulgar wheat.  Pasta (without cream sauces). Look for foods that are nonfat, low in fat, and low in cholesterol.  FIND FOODS THAT ARE LOWER IN FAT AND CHOLESTEROL  Find foods with soluble fiber and plant sterols (phytosterol). You should eat 2 grams a day of these foods. These foods include:  Fruits.  Vegetables.  Whole grains.  Dried beans and peas.  Nuts and seeds.  Read package labels. Look for low-saturated fats, trans fat free, low-fat foods.  Choose cheese that have only 2 to 3 grams of saturated fat per ounce.  Use heart-healthy tub margarine that is free of trans fat or partially hydrogenated oil.  Avoid buying baked goods that have partially hydrogenated oils in them. Instead, buy baked goods made with whole grains (whole-wheat or whole oat flour). Avoid baked goods labeled with "flour" or "enriched flour."  Buy non-creamy canned soups with reduced salt and no added  fats. PREPARING YOUR FOOD  Broil, bake, steam, or roast foods. Do not fry food.  Use non-stick cooking sprays.  Use lemon or herbs to flavor food instead of using butter or stick margarine.  Use nonfat yogurt, salsa, or low-fat dressings for salads. LOW-SATURATED FAT / LOW-FAT FOOD SUBSTITUTES  Meats / Saturated Fat (g)  Avoid: Steak, marbled (3 oz/85 g) / 11 g.  Choose: Steak, lean (3 oz/85 g) / 4 g.  Avoid: Hamburger (3 oz/85 g) / 7 g.  Choose: Hamburger, lean (3 oz/85 g) / 5 g.  Avoid: Ham (3 oz/85 g) / 6 g.  Choose: Ham, lean cut (3 oz/85 g) / 2.4 g.  Avoid: Chicken, with skin, dark meat (3 oz/85 g) / 4 g.  Choose: Chicken, skin removed, dark meat (3 oz/85 g) / 2 g.  Avoid: Chicken, with skin, light meat (3 oz/85 g) / 2.5 g.  Choose: Chicken, skin removed, light meat (3 oz/85 g) / 1 g. Dairy / Saturated Fat (g)  Avoid: Whole milk (1 cup) / 5 g.  Choose: Low-fat milk, 2% (1 cup) / 3 g.  Choose: Low-fat milk, 1% (1 cup) / 1.5 g.  Choose: Skim milk (1 cup) / 0.3 g.  Avoid: Hard cheese (1 oz/28 g) / 6 g.  Choose: Skim milk cheese (1 oz/28 g) / 2 to 3 g.  Avoid: Cottage cheese, 4% fat (1 cup) / 6.5 g.  Choose: Low-fat cottage cheese, 1% fat (1 cup) / 1.5 g.  Avoid: Ice cream (1 cup) / 9 g.  Choose: Sherbet (1 cup) / 2.5 g.  Choose: Nonfat frozen yogurt (1 cup) / 0.3 g.  Choose: Frozen fruit bar / trace.  Avoid: Whipped cream (1 tbs) / 3.5 g.  Choose: Nondairy whipped topping (1 tbs) / 1 g. Condiments / Saturated Fat (g)  Avoid: Mayonnaise (1 tbs) / 2 g.  Choose: Low-fat mayonnaise (1 tbs) / 1 g.  Avoid: Butter (1 tbs) / 7 g.  Choose: Extra light margarine (1 tbs) / 1 g.  Avoid: Coconut oil (1 tbs) / 11.8 g.  Choose: Olive oil (1 tbs) / 1.8 g.  Choose: Corn oil (1 tbs) / 1.7 g.  Choose: Safflower oil (1 tbs) / 1.2 g.  Choose: Sunflower oil (1 tbs) / 1.4 g.  Choose: Soybean oil (1 tbs) / 2.4 g .  Choose: Canola oil (1 tbs) / 1  g. Document Released: 08/29/2011 Document Revised: 10/30/2012 Document Reviewed: 08/29/2011 [facility] Patient Information 2014 [place]. Yeast Infection of the Skin Some yeast on the skin is normal, but sometimes it causes an infection. If you have a yeast infection, it shows up as white or light brown patches on brown skin. You can see it better in the summer on tan skin. It causes light-colored holes in your suntan. It can happen on any area of the body. This cannot be passed from person to person. HOME CARE  Scrub your skin daily with a dandruff shampoo. Your rash may take a couple weeks to get well.  Do not scratch or itch the rash. GET HELP RIGHT AWAY IF:   You get another infection from scratching. The skin may get warm, red, and may ooze fluid.  The infection does not seem to be getting better. MAKE SURE YOU:  Understand these instructions.  Will watch your condition.  Will get help right away if you are not doing well or get worse. Document Released: 02/10/2008 Document Revised: 05/22/2011 Document Reviewed: 02/10/2008 [facility] Patient Information 2014 [place].

## 2013-07-28 ENCOUNTER — Other Ambulatory Visit: Payer: Self-pay | Admitting: Internal Medicine

## 2013-08-07 IMAGING — US US ABDOMEN COMPLETE
1 series · 13 of 25 positions shown · non-contrast
Comparison: None.

CLINICAL DATA: Evaluate for gallbladder disease.  Episodes of
abdominal pain.

ABDOMINAL ULTRASOUND COMPLETE

[Series 1: us abdomen complete · 0.21mm/px · 13 of 61 slices shown]
[im 1/61]
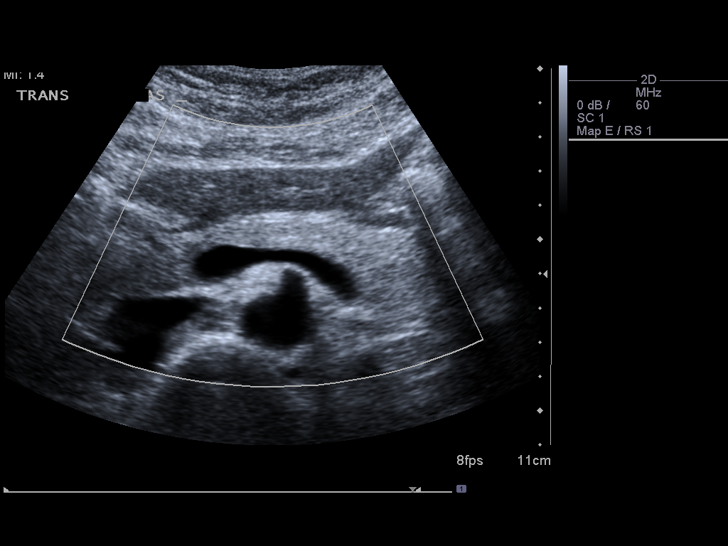
[im 6/61]
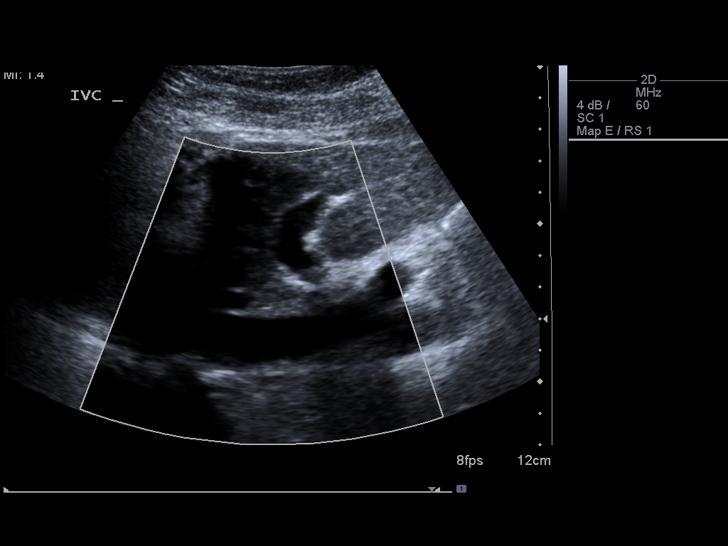
[im 11/61]
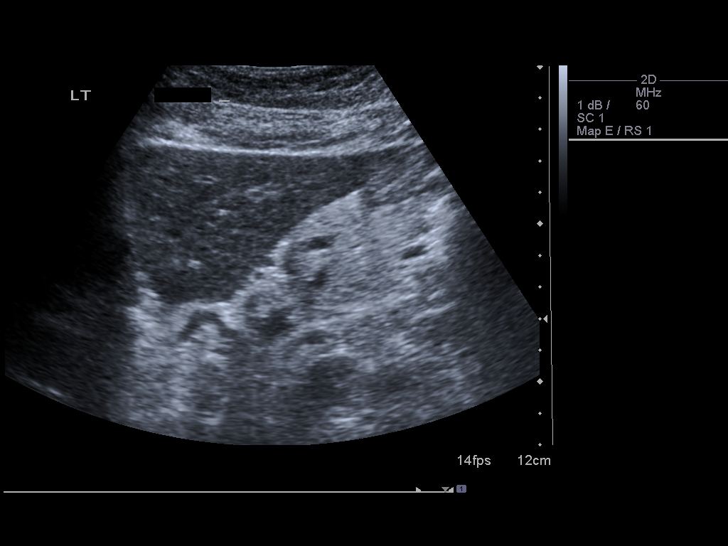
[im 16/61]
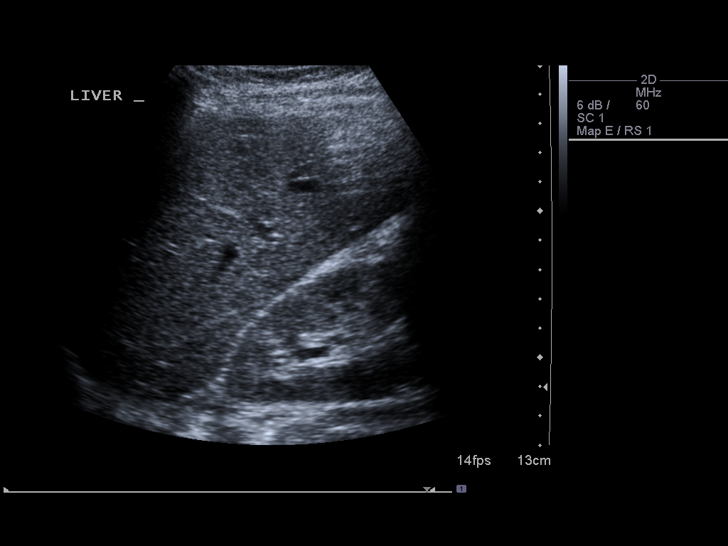
[im 21/61]
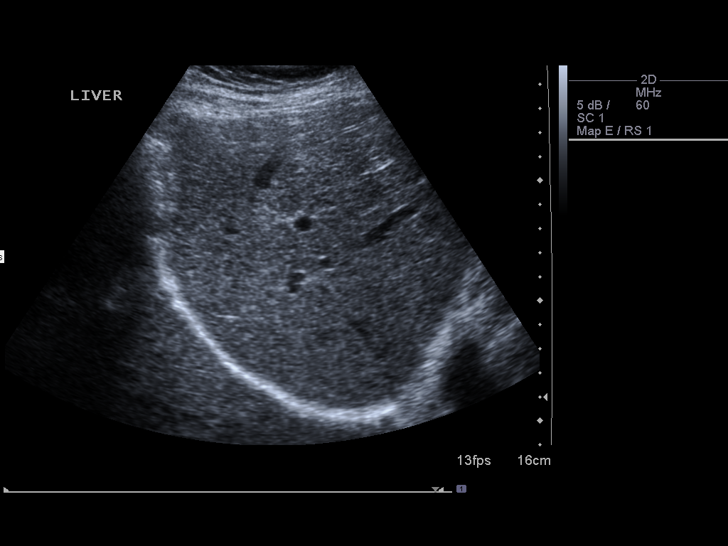
[im 26/61]
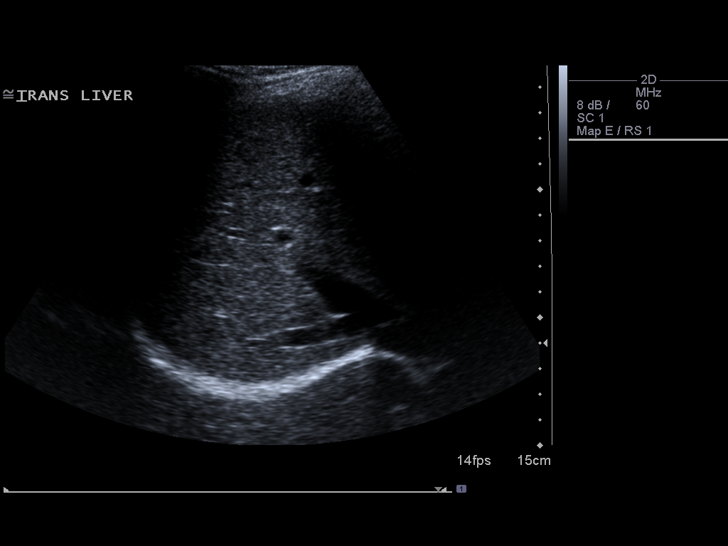
[im 31/61]
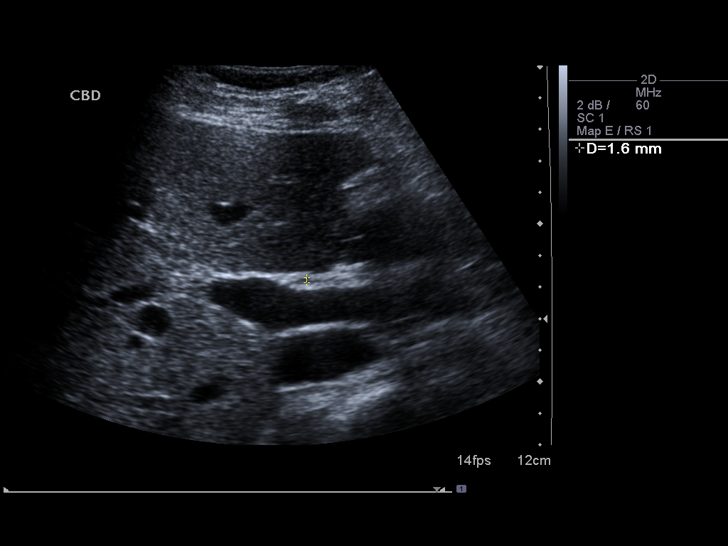
[im 36/61]
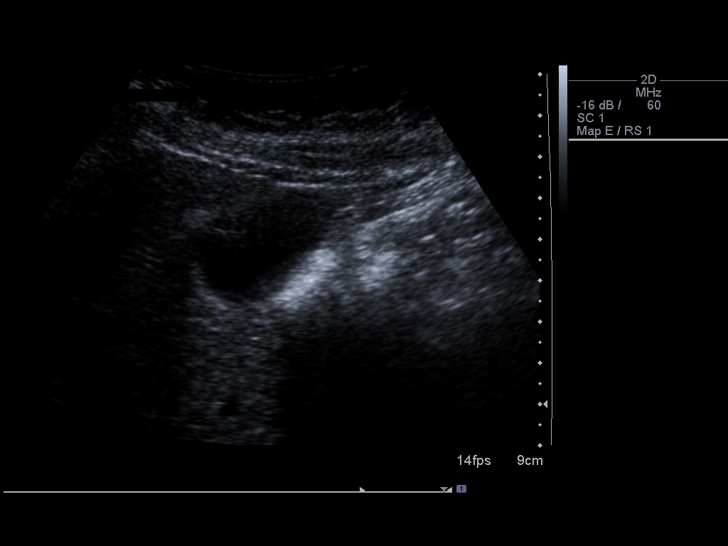
[im 41/61]
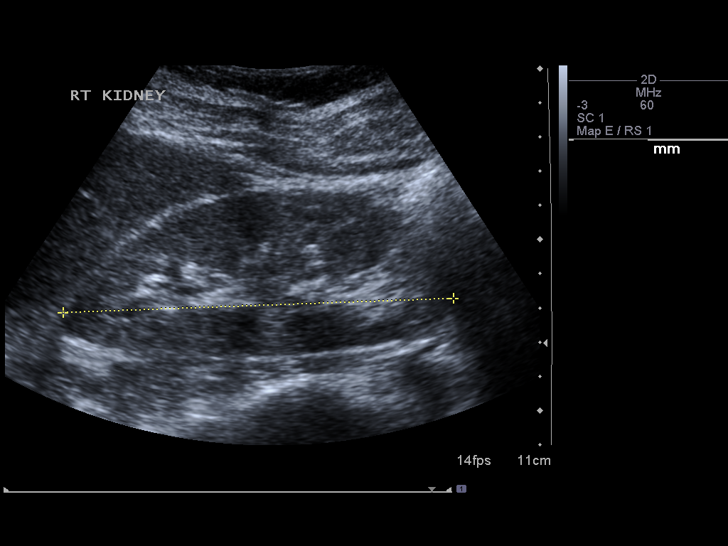
[im 46/61]
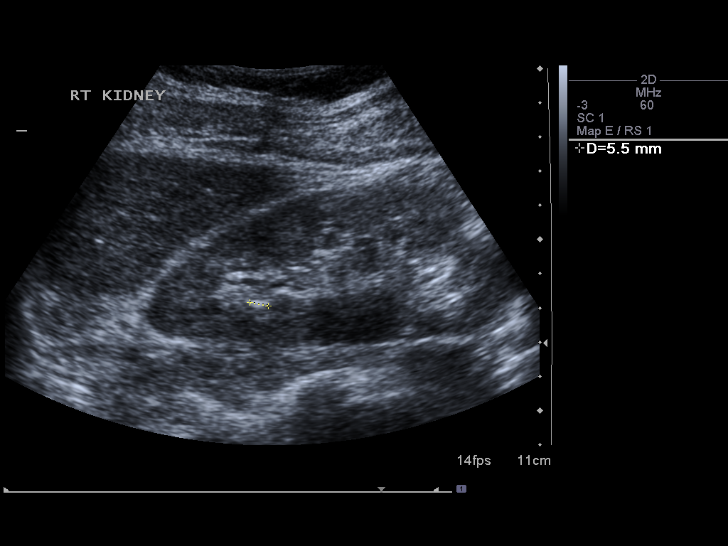
[im 51/61]
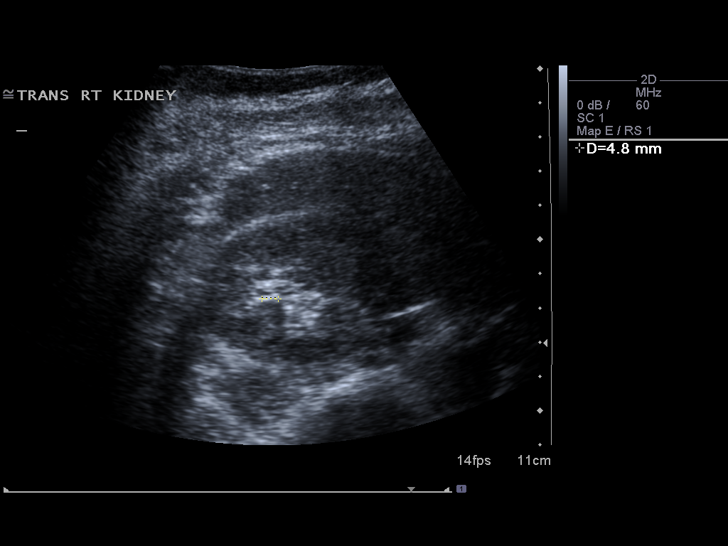
[im 56/61]
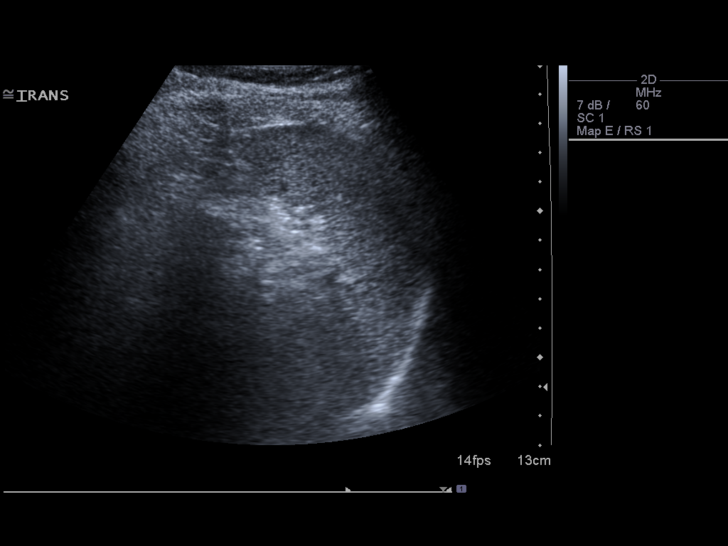
[im 61/61]
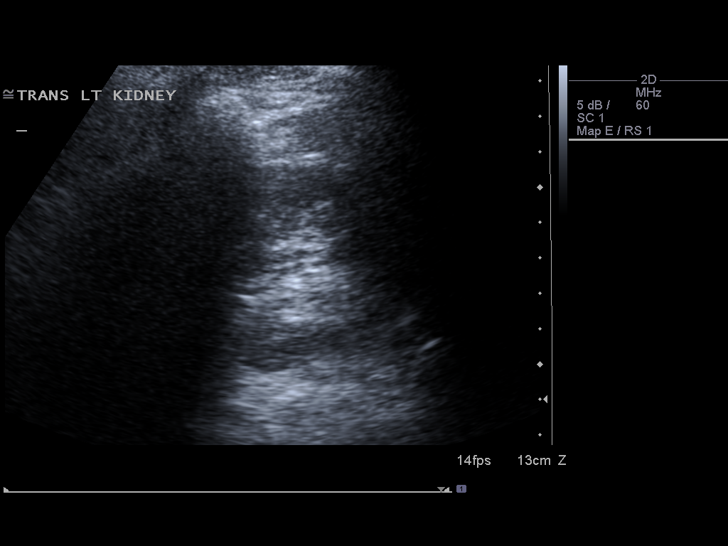

[13 of 25 positions shown; findings below may reference images not displayed]

FINDINGS: Gallbladder:  No gallstones, gallbladder wall thickening, or
pericholecystic fluid.Sonographic Murphy's sign is negative.

Common Bile Duct:  Within normal limits in caliber.1.6 mm.

Liver: No focal mass lesion identified.  Within normal limits in
parenchymal echogenicity. Portal vein is patent and demonstrates
normal direction of flow.

IVC:  Appears normal.

Pancreas:  Although the pancreas is difficult to visualize in its
entirety, no focal pancreatic abnormality is identified. .

Spleen:  Within normal limits in size and echotexture.Measures
cm in length.

Right kidney:  Normal in size and parenchymal echogenicity.  No
evidence of mass or hydronephrosis. A small (6 mm)echogenic focus
with suspected posterior acoustic shadowing in the upper right
kidney.  Cannot exclude a nonobstructing small stone; however this
is not definite.

Left kidney:  Normal in size and parenchymal echogenicity.  No
evidence of mass or hydronephrosis.

Abdominal Aorta:  No aneurysm identified.
IMPRESSION: 1.  Possible 6 mm nonobstructing stone in the upper pole the right
kidney.
2.  Otherwise, negative examination.  Specifically, the
gallbladder, biliary tree, and liver are normal.

## 2013-10-02 ENCOUNTER — Encounter: Payer: Self-pay | Admitting: Emergency Medicine

## 2013-10-04 IMAGING — RF DG CHOLANGIOGRAM OPERATIVE
1 series · 4 of 4 positions shown · non-contrast
Comparison: Ultrasound of the abdomen 05/30/2011

CLINICAL DATA: Right upper quadrant pain

INTRAOPERATIVE CHOLANGIOGRAM
TECHNIQUE: Cholangiographic images from the C-arm fluoroscopic
device were submitted for interpretation post-operatively.  Please
see the procedural report for the amount of contrast and the
fluoroscopy time utilized.

[Series 1: run · 4 of 42 frames shown]
[frame 7/42]
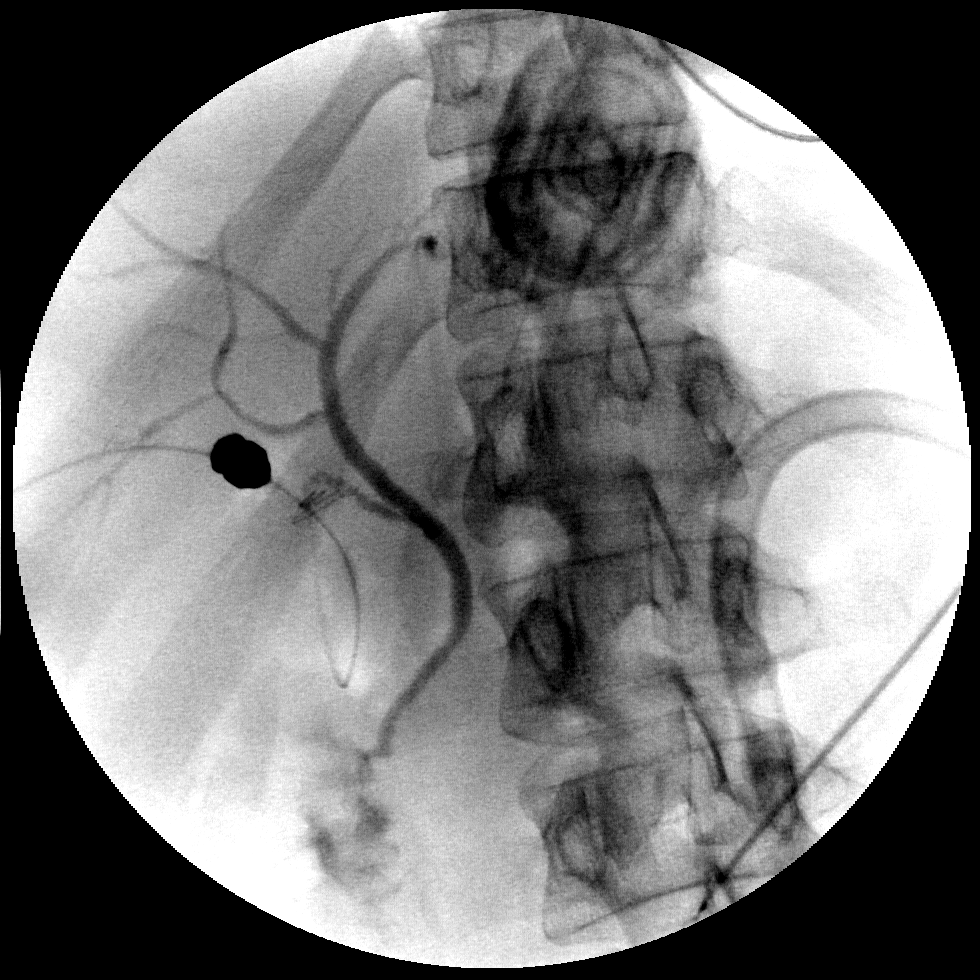
[frame 18/42]
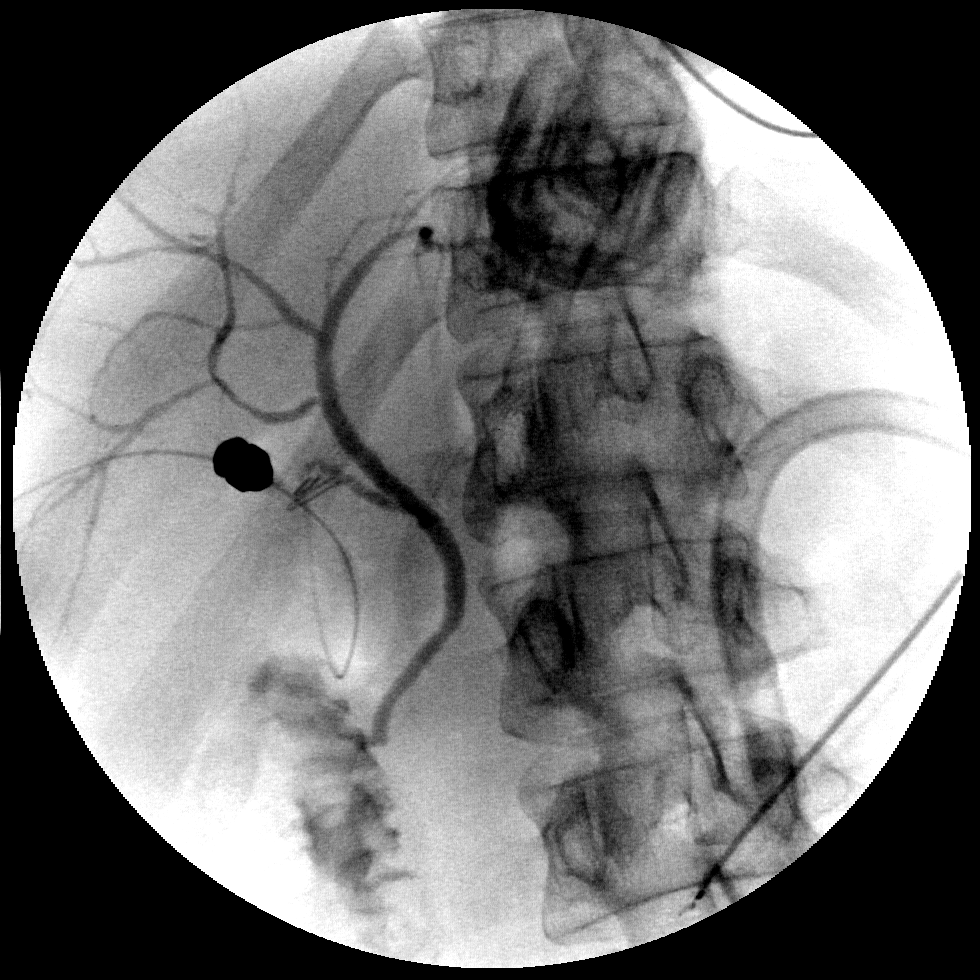
[frame 22/42]
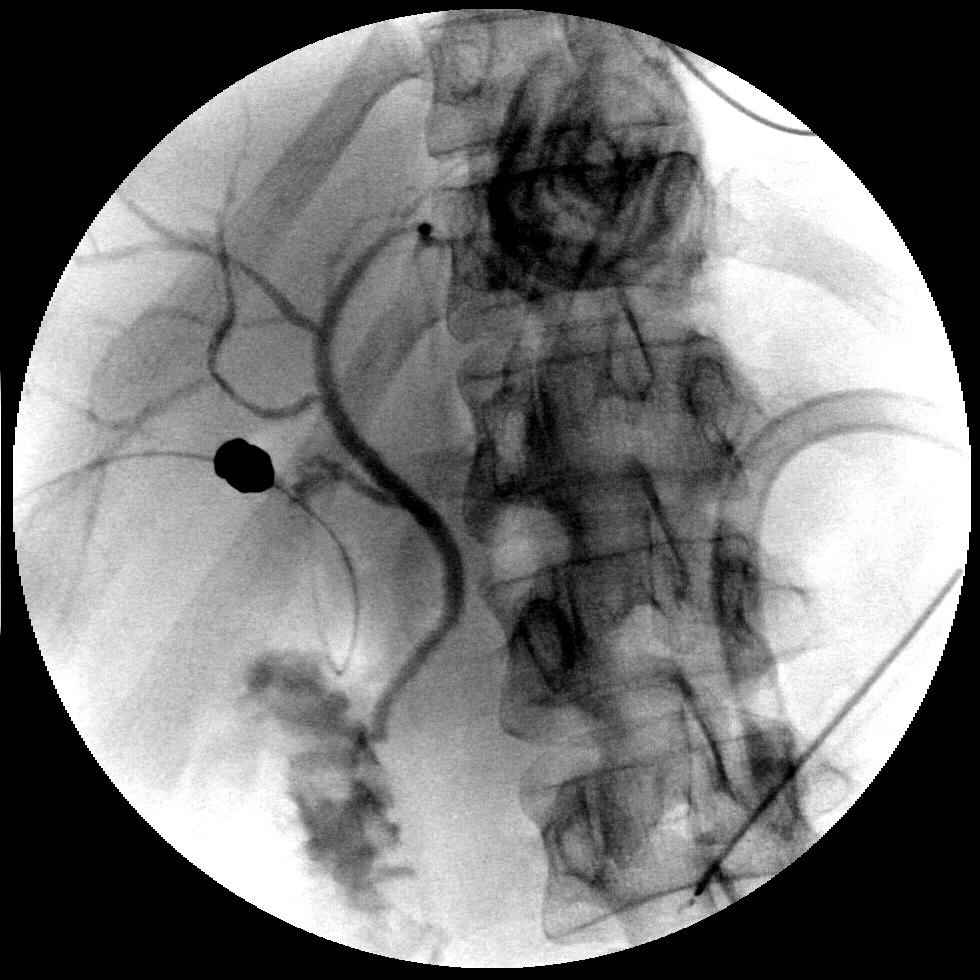
[frame 36/42]
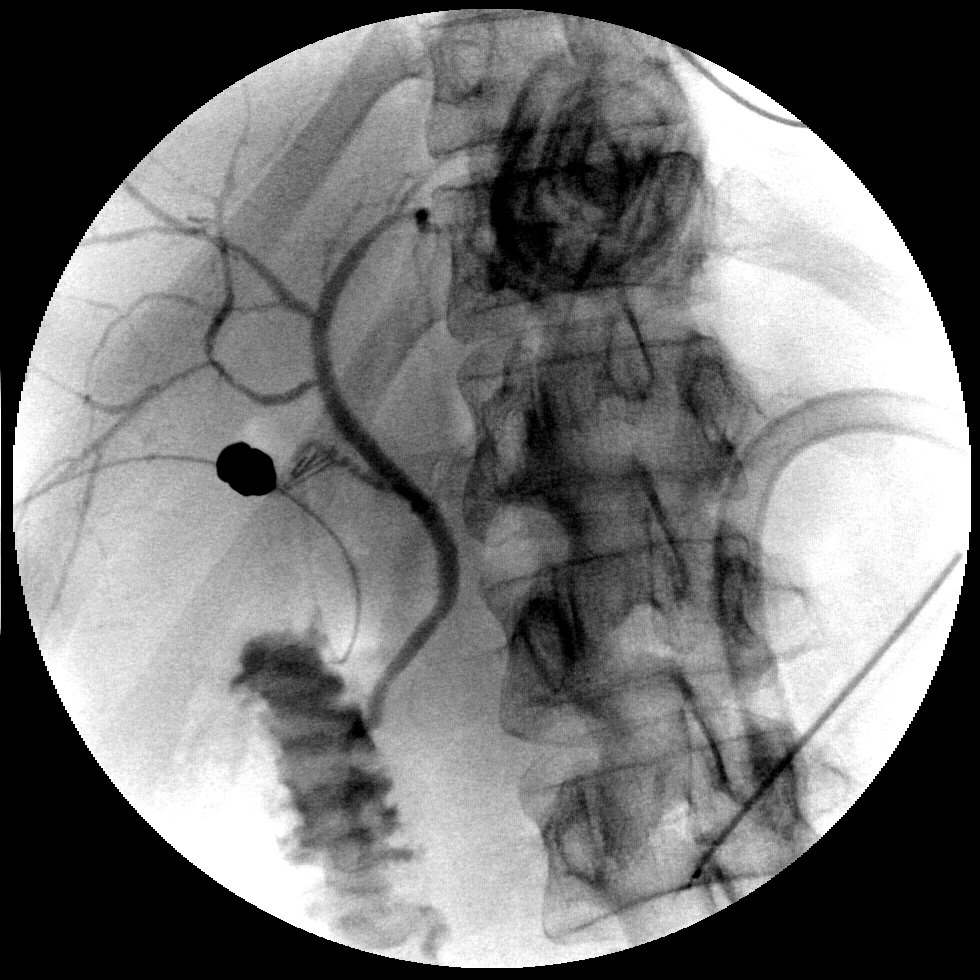

[4 of 4 positions shown; findings below may reference images not displayed]

FINDINGS: The gallbladder has been removed and the cystic duct
cannulated.  There   is good opacification of the biliary tree
without filling defects or obstruction.  Contrast passes readily
into the duodenum.
IMPRESSION: Negative operative cholangiogram.

## 2013-10-31 ENCOUNTER — Encounter: Payer: Self-pay | Admitting: Emergency Medicine

## 2013-11-04 ENCOUNTER — Other Ambulatory Visit: Payer: Self-pay | Admitting: Emergency Medicine

## 2013-11-25 ENCOUNTER — Encounter: Payer: Self-pay | Admitting: Emergency Medicine

## 2013-11-25 ENCOUNTER — Ambulatory Visit (INDEPENDENT_AMBULATORY_CARE_PROVIDER_SITE_OTHER): Payer: BC Managed Care – PPO | Admitting: Emergency Medicine

## 2013-11-25 VITALS — BP 124/82 | HR 70 | Temp 98.2°F | Resp 16 | Ht 63.5 in | Wt 142.0 lb

## 2013-11-25 DIAGNOSIS — Z79899 Other long term (current) drug therapy: Secondary | ICD-10-CM

## 2013-11-25 DIAGNOSIS — Z0000 Encounter for general adult medical examination without abnormal findings: Secondary | ICD-10-CM

## 2013-11-25 DIAGNOSIS — R635 Abnormal weight gain: Secondary | ICD-10-CM

## 2013-11-25 DIAGNOSIS — E782 Mixed hyperlipidemia: Secondary | ICD-10-CM

## 2013-11-25 DIAGNOSIS — Z1212 Encounter for screening for malignant neoplasm of rectum: Secondary | ICD-10-CM

## 2013-11-25 DIAGNOSIS — Z111 Encounter for screening for respiratory tuberculosis: Secondary | ICD-10-CM

## 2013-11-25 DIAGNOSIS — E559 Vitamin D deficiency, unspecified: Secondary | ICD-10-CM

## 2013-11-25 LAB — CBC WITH DIFFERENTIAL/PLATELET
BASOS ABS: 0.1 10*3/uL (ref 0.0–0.1)
BASOS PCT: 1 % (ref 0–1)
Eosinophils Absolute: 0.1 10*3/uL (ref 0.0–0.7)
Eosinophils Relative: 1 % (ref 0–5)
HCT: 40.7 % (ref 36.0–46.0)
Hemoglobin: 14 g/dL (ref 12.0–15.0)
Lymphocytes Relative: 24 % (ref 12–46)
Lymphs Abs: 1.4 10*3/uL (ref 0.7–4.0)
MCH: 27.9 pg (ref 26.0–34.0)
MCHC: 34.4 g/dL (ref 30.0–36.0)
MCV: 81.1 fL (ref 78.0–100.0)
MONO ABS: 0.3 10*3/uL (ref 0.1–1.0)
Monocytes Relative: 6 % (ref 3–12)
NEUTROS ABS: 3.9 10*3/uL (ref 1.7–7.7)
NEUTROS PCT: 68 % (ref 43–77)
PLATELETS: 281 10*3/uL (ref 150–400)
RBC: 5.02 MIL/uL (ref 3.87–5.11)
RDW: 13.5 % (ref 11.5–15.5)
WBC: 5.7 10*3/uL (ref 4.0–10.5)

## 2013-11-25 LAB — HEMOGLOBIN A1C
HEMOGLOBIN A1C: 5.8 % — AB (ref <5.7)
MEAN PLASMA GLUCOSE: 120 mg/dL — AB (ref <117)

## 2013-11-25 NOTE — Progress Notes (Signed)
Subjective:    Patient ID: [name], female    DOB: [birth year], 43 y.o.   MRN: [id number]  HPI Comments: 43 yo WF CPE and cholesterol f/u. She is exercising routinely but has gained 15. She is eating healthy but wants hormone levels checked with weight gain concerns. She notes hse has changed exercise routine and added strength training as well. She is up 11 # since last CPE.  She notes bowels/ stomach have improved with Miralax, water increase and exercise routinely.   She notes allergies have been controlled with Sudafed and Allegra routine.  She is otherwise doing well with out concerns.   WBC             7.0   05/15/2013 HGB            13.5   05/15/2013 HCT            40.1   05/15/2013 PLT             285   05/15/2013 GLUCOSE          62   08/10/2011 CHOL            183   05/15/2013 TRIG            127   05/15/2013 HDL              48   05/15/2013 LDLCALC         110   05/15/2013 ALT              10   05/15/2013 AST              16   05/15/2013 NA              139   08/10/2011 K               4.2   08/10/2011 CL              106   08/10/2011 CREATININE     0.45   08/10/2011 BUN               9   08/10/2011 CO2              15   08/10/2011   Hyperlipidemia     Medication List       This list is accurate as of: 11/25/13  1:37 PM.  Always use your most recent med list.               aspirin 81 MG tablet  Take 81 mg by mouth See admin instructions. Takes 1 tablet on mondays, wednesdays, and fridays     cholecalciferol 1000 UNITS tablet  Commonly known as:  VITAMIN D  Take 2,000 Units by mouth. Takes 3 times per week     fexofenadine 180 MG tablet  Commonly known as:  ALLEGRA  Take 180 mg by mouth. Takes QOD prn     gemfibrozil 600 MG tablet  Commonly known as:  LOPID  Take 600 mg by mouth 2 (two) times daily.     guaiFENesin 600 MG 12 hr tablet  Commonly known as:  MUCINEX  Take 600 mg by mouth 2 (two) times daily.     lubiprostone 8 MCG capsule  Commonly known as:   AMITIZA  Take 8 mcg by mouth 2 (two) times daily with a meal.     Magnesium 400 MG Caps  Take 400 mg by  mouth daily.     meloxicam 15 MG tablet  Commonly known as:  MOBIC  TAKE ONE TABLET BY MOUTH ONCE DAILY AS NEEDED     minocycline 100 MG tablet  Commonly known as:  DYNACIN  Take 100 mg by mouth. Takes QOD     nystatin 100000 UNIT/GM Powd  as needed. Apply BID to affected     polyethylene glycol packet  Commonly known as:  MIRALAX / GLYCOLAX  Take 17 g by mouth daily.     pseudoephedrine 30 MG tablet  Commonly known as:  SUDAFED  Take 30 mg by mouth every 4 (four) hours as needed. congestion     vitamin E 400 UNIT capsule  Take 400 Units by mouth 2 (two) times daily.       Allergies  Allergen Reactions  . Food     strawberries    Past Medical History  Diagnosis Date  . Asthma   . IBS (irritable bowel syndrome)   . HLD (hyperlipidemia)   . Elevated cholesterol   . Acne   . GERD (gastroesophageal reflux disease)   . Nausea   . Difficulty sleeping     due to discomfort  . Chronic kidney disease   . Arthritis    Past Surgical History  Procedure Laterality Date  . Ectopic pregnancy surgery  2005  . Partial hysterectomy  2009  . Esophagogastroduodenoscopy  06/13/2011    Procedure: ESOPHAGOGASTRODUODENOSCOPY (EGD);  Surgeon: Jerene Bears, MD;  Location: [facility];  Service: Gastroenterology;  Laterality: N/A;  . Cholecystectomy  07/27/2011    Procedure: LAPAROSCOPIC CHOLECYSTECTOMY WITH INTRAOPERATIVE CHOLANGIOGRAM;  Surgeon: Shann Medal, MD;  Location: [facility];  Service: General;  Laterality: N/A;   History  Substance Use Topics  . Smoking status: Never Smoker   . Smokeless tobacco: Never Used  . Alcohol Use: No   Family History  Problem Relation Age of Onset  . Breast cancer Mother   . Lymphoma Mother   . Migraines Mother   . Hyperlipidemia Mother   . Stroke Maternal Grandmother   . Diabetes type II Father   . Diabetes Father   . Migraines  Sister   . Hyperlipidemia Sister    MAINTENANCE: EGD: 2013 Mammo:04/2013 AT GYN Pap/ Pelvic:2/@ GYN EYE: yearly WNL- glasses Dentist:q 6 month  IMMUNIZATIONS: Tdap:2014 Pneumovax:2004 Influenza: 12/2012  Patient Care Team: Unk Pinto, MD as PCP - General (Internal Medicine) Jerene Bears, MD as Referring Physician (Gastroenterology) Jimmey Ralph, NP as Nurse Practitioner (Urology) Allena Katz, MD as Consulting Physician (Obstetrics and Gynecology) Simona Huh, MD as Consulting Physician (Dermatology) Inocencio Homes, DPM as Consulting Physician (Podiatry) [place], [place]) Quillian Quince, (Dentist)  Review of Systems  Constitutional: Positive for unexpected weight change.  All other systems reviewed and are negative.  BP 124/82  Pulse 70  Temp(Src) 98.2 F (36.8 C) (Temporal)  Resp 16  Ht 5' 3.5" (1.613 m)  Wt 142 lb (64.411 kg)  BMI 24.76 kg/m2      Objective:   Physical Exam  Nursing note and vitals reviewed. Constitutional: She is oriented to person, place, and time. She appears well-developed and well-nourished. No distress.  HENT:  Head: Normocephalic and atraumatic.  Right Ear: External ear normal.  Left Ear: External ear normal.  Nose: Nose normal.  Mouth/Throat: Oropharynx is clear and moist.  Posterior pharynx with cobble stones, clear exudate   Eyes: Conjunctivae and EOM are normal. Pupils are equal, round, and reactive to light.  Right eye exhibits no discharge. Left eye exhibits no discharge. No scleral icterus.  Neck: Normal range of motion. Neck supple. No JVD present. No tracheal deviation present. No thyromegaly present.  Cardiovascular: Normal rate, regular rhythm, normal heart sounds and intact distal pulses.   Pulmonary/Chest: Effort normal and breath sounds normal.  Abdominal: Soft. Bowel sounds are normal. She exhibits no distension and no mass. There is no tenderness. There is no rebound and no guarding.  Genitourinary:   Def GYN  Musculoskeletal: Normal range of motion. She exhibits no edema and no tenderness.  Lymphadenopathy:    She has no cervical adenopathy.  Neurological: She is alert and oriented to person, place, and time. She has normal reflexes. No cranial nerve deficit. She exhibits normal muscle tone. Coordination normal.  Skin: Skin is warm and dry. No rash noted. No erythema. No pallor.  Psychiatric: She has a normal mood and affect. Her behavior is normal. Judgment and thought content normal.   EKG NSCSPT WNL     Assessment & Plan:  1. CPE- Update screening labs/ History/ Immunizations/ Testing as needed. Advised healthy diet, QD exercise, increase H20 and continue RX/ Vitamins AD.   2. Weight gain concerns- Check hormones as requested per patient but advised could be perimenopausal, needs increase fiber/ water/ change exercise activity  3. Cholesterol- recheck labs, Need to eat healthier and exercise AD.  4. Allergic rhinitis- Continue AD Allegra OTC, increase H2o, allergy hygiene explained.

## 2013-11-25 NOTE — Patient Instructions (Signed)
Menopause Menopause is the normal time of life when menstrual periods stop completely. Menopause is complete when you have missed 12 consecutive menstrual periods. It usually occurs between the ages of 43 years and 55 years. Very rarely does a woman develop menopause before the age of 43 years. At menopause, your ovaries stop producing the female hormones estrogen and progesterone. This can cause undesirable symptoms and also affect your health. Sometimes the symptoms may occur 4-5 years before the menopause begins. There is no relationship between menopause and:  Oral contraceptives.  Number of children you had.  Race.  The age your menstrual periods started (menarche). Heavy smokers and very thin women may develop menopause earlier in life. CAUSES  The ovaries stop producing the female hormones estrogen and progesterone.  Other causes include:  Surgery to remove both ovaries.  The ovaries stop functioning for no known reason.  Tumors of the pituitary gland in the brain.  Medical disease that affects the ovaries and hormone production.  Radiation treatment to the abdomen or pelvis.  Chemotherapy that affects the ovaries. SYMPTOMS   Hot flashes.  Night sweats.  Decrease in sex drive.  Vaginal dryness and thinning of the vagina causing painful intercourse.  Dryness of the skin and developing wrinkles.  Headaches.  Tiredness.  Irritability.  Memory problems.  Weight gain.  Bladder infections.  Hair growth of the face and chest.  Infertility. More serious symptoms include:  Loss of bone (osteoporosis) causing breaks (fractures).  Depression.  Hardening and narrowing of the arteries (atherosclerosis) causing heart attacks and strokes. DIAGNOSIS   When the menstrual periods have stopped for 12 straight months.  Physical exam.  Hormone studies of the blood. TREATMENT  There are many treatment choices and nearly as many questions about them. The  decisions to treat or not to treat menopausal changes is an individual choice made with your health care provider. Your health care provider can discuss the treatments with you. Together, you can decide which treatment will work best for you. Your treatment choices may include:   Hormone therapy (estrogen and progesterone).  Non-hormonal medicines.  Treating the individual symptoms with medicine (for example antidepressants for depression).  Herbal medicines that may help specific symptoms.  Counseling by a psychiatrist or psychologist.  Group therapy.  Lifestyle changes including:  Eating healthy.  Regular exercise.  Limiting caffeine and alcohol.  Stress management and meditation.  No treatment. HOME CARE INSTRUCTIONS   Take the medicine your health care provider gives you as directed.  Get plenty of sleep and rest.  Exercise regularly.  Eat a diet that contains calcium (good for the bones) and soy products (acts like estrogen hormone).  Avoid alcoholic beverages.  Do not smoke.  If you have hot flashes, dress in layers.  Take supplements, calcium, and vitamin D to strengthen bones.  You can use over-the-counter lubricants or moisturizers for vaginal dryness.  Group therapy is sometimes very helpful.  Acupuncture may be helpful in some cases. SEEK MEDICAL CARE IF:   You are not sure you are in menopause.  You are having menopausal symptoms and need advice and treatment.  You are still having menstrual periods after age 55 years.  You have pain with intercourse.  Menopause is complete (no menstrual period for 12 months) and you develop vaginal bleeding.  You need a referral to a specialist (gynecologist, psychiatrist, or psychologist) for treatment. SEEK IMMEDIATE MEDICAL CARE IF:   You have severe depression.  You have excessive vaginal bleeding.    You fell and think you have a broken bone.  You have pain when you urinate.  You develop leg or  chest pain.  You have a fast pounding heart beat (palpitations).  You have severe headaches.  You develop vision problems.  You feel a lump in your breast.  You have abdominal pain or severe indigestion. Document Released: 05/20/2003 Document Revised: 10/30/2012 Document Reviewed: 09/26/2012 [employer] Patient Information 2015 [employer]. This information is not intended to replace advice given to you by your health care provider. Make sure you discuss any questions you have with your health care provider.  

## 2013-11-26 LAB — VITAMIN B12: Vitamin B-12: 394 pg/mL (ref 211–911)

## 2013-11-26 LAB — URINALYSIS, ROUTINE W REFLEX MICROSCOPIC
Bilirubin Urine: NEGATIVE
Glucose, UA: NEGATIVE mg/dL
HGB URINE DIPSTICK: NEGATIVE
KETONES UR: NEGATIVE mg/dL
Leukocytes, UA: NEGATIVE
NITRITE: NEGATIVE
PH: 7 (ref 5.0–8.0)
PROTEIN: NEGATIVE mg/dL
Specific Gravity, Urine: 1.008 (ref 1.005–1.030)
Urobilinogen, UA: 0.2 mg/dL (ref 0.0–1.0)

## 2013-11-26 LAB — MAGNESIUM: MAGNESIUM: 2.3 mg/dL (ref 1.5–2.5)

## 2013-11-26 LAB — BASIC METABOLIC PANEL WITH GFR
BUN: 10 mg/dL (ref 6–23)
CHLORIDE: 103 meq/L (ref 96–112)
CO2: 27 mEq/L (ref 19–32)
Calcium: 9.2 mg/dL (ref 8.4–10.5)
Creat: 0.59 mg/dL (ref 0.50–1.10)
Glucose, Bld: 81 mg/dL (ref 70–99)
POTASSIUM: 4.4 meq/L (ref 3.5–5.3)
SODIUM: 136 meq/L (ref 135–145)

## 2013-11-26 LAB — INSULIN, FASTING: INSULIN FASTING, SERUM: 4.1 u[IU]/mL (ref 2.0–19.6)

## 2013-11-26 LAB — HEPATIC FUNCTION PANEL
ALK PHOS: 79 U/L (ref 39–117)
ALT: 12 U/L (ref 0–35)
AST: 15 U/L (ref 0–37)
Albumin: 4.6 g/dL (ref 3.5–5.2)
BILIRUBIN DIRECT: 0.1 mg/dL (ref 0.0–0.3)
BILIRUBIN TOTAL: 0.5 mg/dL (ref 0.2–1.2)
Indirect Bilirubin: 0.4 mg/dL (ref 0.2–1.2)
Total Protein: 6.9 g/dL (ref 6.0–8.3)

## 2013-11-26 LAB — LIPID PANEL
CHOL/HDL RATIO: 3.2 ratio
Cholesterol: 190 mg/dL (ref 0–200)
HDL: 60 mg/dL (ref >39)
LDL Cholesterol: 119 mg/dL — ABNORMAL HIGH (ref 0–99)
Triglycerides: 55 mg/dL (ref <150)
VLDL: 11 mg/dL (ref 0–40)

## 2013-11-26 LAB — MICROALBUMIN / CREATININE URINE RATIO
CREATININE, URINE: 34.6 mg/dL
MICROALB/CREAT RATIO: 14.5 mg/g (ref 0.0–30.0)
Microalb, Ur: 0.5 mg/dL (ref 0.00–1.89)

## 2013-11-26 LAB — TSH: TSH: 0.915 u[IU]/mL (ref 0.350–4.500)

## 2013-11-26 LAB — FOLLICLE STIMULATING HORMONE: FSH: 7.2 m[IU]/mL

## 2013-11-26 LAB — VITAMIN D 25 HYDROXY (VIT D DEFICIENCY, FRACTURES): Vit D, 25-Hydroxy: 53 ng/mL (ref 30–89)

## 2013-11-26 LAB — LUTEINIZING HORMONE: LH: 5.6 m[IU]/mL

## 2013-11-27 LAB — ESTROGENS, TOTAL: Estrogen: 179 pg/mL

## 2013-11-28 LAB — TB SKIN TEST
INDURATION: 0 mm
TB SKIN TEST: NEGATIVE

## 2013-12-16 ENCOUNTER — Other Ambulatory Visit: Payer: Self-pay | Admitting: Internal Medicine

## 2014-01-10 ENCOUNTER — Ambulatory Visit (INDEPENDENT_AMBULATORY_CARE_PROVIDER_SITE_OTHER): Payer: BC Managed Care – PPO | Admitting: Family Medicine

## 2014-01-10 VITALS — BP 134/87 | HR 67 | Temp 98.3°F | Resp 16 | Ht 63.5 in | Wt 143.2 lb

## 2014-01-10 DIAGNOSIS — K6289 Other specified diseases of anus and rectum: Secondary | ICD-10-CM

## 2014-01-10 DIAGNOSIS — K645 Perianal venous thrombosis: Secondary | ICD-10-CM

## 2014-01-10 MED ORDER — LIDOCAINE HCL 2 % EX GEL: 1.0000 "application " | CUTANEOUS | Status: DC | PRN

## 2014-01-10 MED ORDER — DILTIAZEM GEL 2 %: 1.0000 "application " | Freq: Two times a day (BID) | CUTANEOUS | Status: DC | PRN

## 2014-01-10 MED ORDER — HYDROCORTISONE ACETATE 25 MG RE SUPP: 25.0000 mg | Freq: Two times a day (BID) | RECTAL | Status: DC

## 2014-01-10 NOTE — Progress Notes (Signed)
Chief Complaint:  Chief Complaint  Patient presents with  . Hemorrhoids    HPI: [name] is a 43 y.o. female who is here for tucks pad, proctofoam, otc meds prior rash broke out her bottom.  She has had hemorrhoids in the past, but manageable. She has had this for 4 days.  Cant run, can't walk as well. No etiology, no abd straining, lifting, etc She has IBS and takes laxative daily so does not have constipation issues No fevers, chills, n/v/abd pain  Past Medical History  Diagnosis Date  . Asthma   . IBS (irritable bowel syndrome)   . HLD (hyperlipidemia)   . Elevated cholesterol   . Acne   . GERD (gastroesophageal reflux disease)   . Nausea   . Difficulty sleeping     due to discomfort  . Chronic kidney disease   . Arthritis    Past Surgical History  Procedure Laterality Date  . Ectopic pregnancy surgery  2005  . Partial hysterectomy  2009  . Esophagogastroduodenoscopy  06/13/2011    Procedure: ESOPHAGOGASTRODUODENOSCOPY (EGD);  Surgeon: Jerene Bears, MD;  Location: [facility];  Service: Gastroenterology;  Laterality: N/A;  . Cholecystectomy  07/27/2011    Procedure: LAPAROSCOPIC CHOLECYSTECTOMY WITH INTRAOPERATIVE CHOLANGIOGRAM;  Surgeon: Shann Medal, MD;  Location: [facility];  Service: General;  Laterality: N/A;   History   Social History  . Marital Status: Married    Spouse Name: N/A    Number of Children: 2  . Years of Education: N/A   Occupational History  . 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 Smoking status: Never Smoker   . Smokeless tobacco: Never Used  . Alcohol Use: No  . Drug Use: No  . Sexual Activity: None   Other Topics Concern  . None   Social History Narrative  . None   Family History  Problem Relation Age of Onset  . Breast cancer Mother   . Lymphoma Mother   . Migraines Mother   . Hyperlipidemia Mother   . Stroke Maternal Grandmother   . Diabetes type II Father   . Diabetes Father   . Migraines Sister    . Hyperlipidemia Sister    Allergies  Allergen Reactions  . Food     strawberries   Prior to Admission medications   Medication Sig Start Date End Date Taking? Authorizing Provider  AMITIZA 8 MCG capsule TAKE ONE CAPSULE BY MOUTH TWICE DAILY FOR  IBS 12/16/13  Yes Vicie Mutters, PA-C  aspirin 81 MG tablet Take 81 mg by mouth See admin instructions. Takes 1 tablet on mondays, wednesdays, and fridays   Yes Historical Provider, MD  cholecalciferol (VITAMIN D) 1000 UNITS tablet Take 2,000 Units by mouth. Takes 3 times per week   Yes Historical Provider, MD  fexofenadine (ALLEGRA) 180 MG tablet Take 180 mg by mouth. Takes QOD prn   Yes Historical Provider, MD  gemfibrozil (LOPID) 600 MG tablet Take 600 mg by mouth 2 (two) times daily.    Yes Historical Provider, MD  guaiFENesin (MUCINEX) 600 MG 12 hr tablet Take 600 mg by mouth 2 (two) times daily.    Yes Historical Provider, MD  Magnesium 400 MG CAPS Take 400 mg by mouth daily.   Yes Historical Provider, MD  meloxicam (MOBIC) 15 MG tablet TAKE ONE TABLET BY MOUTH ONCE DAILY AS NEEDED 11/04/13 11/05/14 Yes Unk Pinto, MD  minocycline (DYNACIN) 100 MG tablet Take 100 mg by  mouth. Takes QOD   Yes Historical Provider, MD  polyethylene glycol (MIRALAX / GLYCOLAX) packet Take 17 g by mouth daily.   Yes Historical Provider, MD  pseudoephedrine (SUDAFED) 30 MG tablet Take 30 mg by mouth every 4 (four) hours as needed. congestion   Yes Historical Provider, MD  vitamin E 400 UNIT capsule Take 400 Units by mouth 2 (two) times daily.   Yes Historical Provider, MD     ROS: The patient denies fevers, chills, night sweats, unintentional weight loss, chest pain, palpitations, wheezing, dyspnea on exertion, nausea, vomiting, abdominal pain, dysuria, hematuria, melena, numbness, weakness, or tingling.  All other systems have been reviewed and were otherwise negative with the exception of those mentioned in the HPI and as above.    PHYSICAL EXAM: Filed  Vitals:   01/10/14 1108  BP: 134/87  Pulse: 67  Temp: 98.3 F (36.8 C)  Resp: 16   Filed Vitals:   01/10/14 1108  Height: 5' 3.5" (1.613 m)  Weight: 143 lb 4 oz (64.978 kg)   Body mass index is 24.97 kg/(m^2).  General: Alert, no acute distress HEENT:  Normocephalic, atraumatic, oropharynx patent. EOMI, PERRLA Cardiovascular:  Regular rate and rhythm, no rubs murmurs or gallops.  No Carotid bruits, radial pulse intact. No pedal edema.  Respiratory: Clear to auscultation bilaterally.  No wheezes, rales, or rhonchi.  No cyanosis, no use of accessory musculature GI: No organomegaly, abdomen is soft and non-tender, positive bowel sounds.  No masses. Skin: No rashes. Neurologic: Facial musculature symmetric. Psychiatric: Patient is appropriate throughout our interaction. Lymphatic: No cervical lymphadenopathy Musculoskeletal: Gait intact. + thrombosed external hemorrhoid   LABS:    EKG/XRAY:   Primary read interpreted by Dr. Marin Comment at [facility].   ASSESSMENT/PLAN: Encounter Diagnoses  Name Primary?  . Hemorrhoid thrombosis Yes  . Rectal pain    RX anusol, diltiazem, lidocaine F/u prn   Gross sideeffects, risk and benefits, and alternatives of medications d/w patient. Patient is aware that all medications have potential sideeffects and we are unable to predict every sideeffect or drug-drug interaction that may occur.  LE, [place], DO 01/10/2014 12:20 PM

## 2014-01-10 NOTE — Patient Instructions (Signed)

## 2014-01-10 NOTE — Progress Notes (Signed)
Procedure Note Consent obtained. 3cc 1% lido local anesthesia. 1 cm elliptical incision made into hemorrhoid. Several thrombi removed. Tissue explored. Dressing placed.    Alveta Heimlich, PA-C

## 2014-01-13 ENCOUNTER — Other Ambulatory Visit: Payer: Self-pay | Admitting: Physician Assistant

## 2014-01-18 ENCOUNTER — Other Ambulatory Visit: Payer: Self-pay | Admitting: Internal Medicine

## 2014-01-18 ENCOUNTER — Encounter: Payer: Self-pay | Admitting: Internal Medicine

## 2014-01-18 MED ORDER — HYDROCORTISONE 2.5 % RE CREA: TOPICAL_CREAM | RECTAL | Status: DC

## 2014-03-01 ENCOUNTER — Other Ambulatory Visit: Payer: Self-pay | Admitting: Internal Medicine

## 2014-04-23 ENCOUNTER — Ambulatory Visit (INDEPENDENT_AMBULATORY_CARE_PROVIDER_SITE_OTHER): Payer: BLUE CROSS/BLUE SHIELD | Admitting: Internal Medicine

## 2014-04-23 ENCOUNTER — Encounter: Payer: Self-pay | Admitting: Internal Medicine

## 2014-04-23 VITALS — BP 124/78 | HR 76 | Temp 99.0°F | Resp 16 | Ht 63.5 in | Wt 141.4 lb

## 2014-04-23 DIAGNOSIS — N3000 Acute cystitis without hematuria: Secondary | ICD-10-CM

## 2014-04-23 DIAGNOSIS — R7303 Prediabetes: Secondary | ICD-10-CM

## 2014-04-23 DIAGNOSIS — R7309 Other abnormal glucose: Secondary | ICD-10-CM

## 2014-04-23 DIAGNOSIS — Z87898 Personal history of other specified conditions: Secondary | ICD-10-CM | POA: Insufficient documentation

## 2014-04-23 DIAGNOSIS — Z79899 Other long term (current) drug therapy: Secondary | ICD-10-CM

## 2014-04-23 DIAGNOSIS — E785 Hyperlipidemia, unspecified: Secondary | ICD-10-CM

## 2014-04-23 DIAGNOSIS — E559 Vitamin D deficiency, unspecified: Secondary | ICD-10-CM

## 2014-04-23 LAB — CBC WITH DIFFERENTIAL/PLATELET
BASOS ABS: 0 10*3/uL (ref 0.0–0.1)
Basophils Relative: 0 % (ref 0–1)
Eosinophils Absolute: 0.1 10*3/uL (ref 0.0–0.7)
Eosinophils Relative: 1 % (ref 0–5)
HCT: 41.4 % (ref 36.0–46.0)
Hemoglobin: 13.6 g/dL (ref 12.0–15.0)
Lymphocytes Relative: 17 % (ref 12–46)
Lymphs Abs: 1 10*3/uL (ref 0.7–4.0)
MCH: 27.6 pg (ref 26.0–34.0)
MCHC: 32.9 g/dL (ref 30.0–36.0)
MCV: 84.1 fL (ref 78.0–100.0)
MONO ABS: 0.4 10*3/uL (ref 0.1–1.0)
MPV: 10 fL (ref 8.6–12.4)
Monocytes Relative: 6 % (ref 3–12)
NEUTROS ABS: 4.6 10*3/uL (ref 1.7–7.7)
Neutrophils Relative %: 76 % (ref 43–77)
PLATELETS: 293 10*3/uL (ref 150–400)
RBC: 4.92 MIL/uL (ref 3.87–5.11)
RDW: 13.5 % (ref 11.5–15.5)
WBC: 6.1 10*3/uL (ref 4.0–10.5)

## 2014-04-23 MED ORDER — CIPROFLOXACIN HCL 500 MG PO TABS: ORAL_TABLET | ORAL | Status: AC

## 2014-04-23 NOTE — Patient Instructions (Signed)

## 2014-04-23 NOTE — Progress Notes (Signed)
Subjective:    Patient ID: [name], female    DOB: 08-23-1970, 44 y.o.   MRN: [id number]  HPI  Patient presents with 3-4 da hx/o urgency & today began with Right flank > LBP & slight positional component w/o fever/chills or  sciatica. No cardiac, respiratory, GI sx's. Patient is also for quarterly labs for lipids, preDm, Vit D Def & desires to have appropriate labs done today  Medication Sig  . AMITIZA 8 MCG capsule TAKE ONE CAPSULE BY MOUTH TWICE DAILY FOR IBS  . aspirin 81 MG tablet  1 tablet on mondays, wednesdays, and fridays  . cholecalciferol (VITAMIN D) 1000 UNITS tablet Take 2,000 Units by mouth. Takes 3 times per week  . fexofenadine (ALLEGRA) 180 MG tablet Take 180 mg by mouth. Takes QOD prn  . gemfibrozil (LOPID) 600 MG tablet TAKE ONE TABLET BY MOUTH TWICE DAILY FOR CHOLESTEROL  . guaiFENesin (MUCINEX) 600 MG 12 hr tablet Take 600 mg by mouth 2 (two) times daily.   . Magnesium 400 MG CAPS Take 400 mg by mouth daily.  . meloxicam (MOBIC) 15 MG tablet TAKE ONE TABLET BY MOUTH ONCE DAILY AS NEEDED  . minocycline (DYNACIN) 100 MG tablet Take 100 mg by mouth. Takes QOD  . polyethylene glycol (MIRALAX ) packet Take 17 g by mouth daily.  . pseudoephedrine (SUDAFED) 30 MG tablet Take 30 mg by mouth every 4 (four) hours as needed. congestion  . vitamin E 400 UNIT capsule Take 400 Units  2  times daily.  Marland Kitchen diltiazem 2 % GEL Apply 1 application topically per rectum  2  times daily as needed.  . hydrocortisone (ANUSOL-HC) 25 MG suppository Place 1 suppository  rectally 2  times daily.  . hydrocortisone (PROCTOSOL HC) 2.5 %  crm Apply prn  . lidocaine (XYLOCAINE JELLY) 2 % jelly Apply 1 application topically as needed.   Allergies  Allergen Reactions  . Food     strawberries   Past Medical History  Diagnosis Date  . Asthma   . IBS (irritable bowel syndrome)   . HLD (hyperlipidemia)   . Elevated cholesterol   . Acne   . GERD (gastroesophageal reflux disease)   . Nausea   .  Difficulty sleeping     due to discomfort  . Chronic kidney disease   . Arthritis    Past Surgical History  Procedure Laterality Date  . Ectopic pregnancy surgery  2005  . Partial hysterectomy  2009  . Esophagogastroduodenoscopy  06/13/2011    Procedure: ESOPHAGOGASTRODUODENOSCOPY (EGD);  Surgeon: Jerene Bears, MD;  Location: [facility];  Service: Gastroenterology;  Laterality: N/A;  . Cholecystectomy  07/27/2011    Procedure: LAPAROSCOPIC CHOLECYSTECTOMY WITH INTRAOPERATIVE CHOLANGIOGRAM;  Surgeon: Shann Medal, MD;  Location: [facility];  Service: General;  Laterality: N/A;   Review of Systems  10 point systems review negative except as above.    Objective:   Physical Exam   BP 124/78 mmHg  Pulse 76  Temp(Src) 99 F (37.2 C)  Resp 16  Ht 5' 3.5" (1.613 m)  Wt 141 lb 6.4 oz (64.139 kg)  BMI 24.65 kg/m2  HEENT - Eac's patent. TM's Nl. EOM's full. PERRLA. NasoOroPharynx clear. Neck - supple. Nl Thyroid. Carotids 2+ & No bruits, nodes, JVD Chest - Clear equal BS w/o Rales, rhonchi, wheezes. Cor - Nl HS. RRR w/o sig MGR. PP 1(+). No edema. Abd - Soft w/o palpable organomegaly, masses or tenderness. BS nl. MS- FROM w/o deformities. (+)  tender  Muscle power, tone and bulk Nl. Gait Nl. Neuro - No obvious Cr N abnormalities. Sensory, motor and Cerebellar functions appear Nl w/o focal abnormalities. Psyche - Mental status normal & appropriate.  No delusions, ideations or obvious mood abnormalities.     Assessment & Plan:   1. Acute cystitis without hematuria- suspected  - Urine Microscopic - Urine culture - ciprofloxacin (CIPRO) 500 MG tablet; Take 1 tablet 2 x day with food as directed for UTI  Dispense: 28 tablet; Refill: 0  2. Hyperlipidemia  - Lipid panel - TSH  3. Prediabetes  - Hemoglobin A1c - Insulin, fasting  4. Vitamin D deficiency  - Vit D  25 hydroxy (rtn osteoporosis monitoring)  5. Medication management  - CBC with Differential/Platelet - BASIC  METABOLIC PANEL WITH GFR - Hepatic function panel

## 2014-04-24 ENCOUNTER — Telehealth: Payer: Self-pay | Admitting: *Deleted

## 2014-04-24 ENCOUNTER — Encounter: Payer: Self-pay | Admitting: Internal Medicine

## 2014-04-24 LAB — VITAMIN D 25 HYDROXY (VIT D DEFICIENCY, FRACTURES): VIT D 25 HYDROXY: 37 ng/mL (ref 30–100)

## 2014-04-24 LAB — BASIC METABOLIC PANEL WITH GFR
BUN: 15 mg/dL (ref 6–23)
CALCIUM: 9.1 mg/dL (ref 8.4–10.5)
CO2: 24 meq/L (ref 19–32)
CREATININE: 0.62 mg/dL (ref 0.50–1.10)
Chloride: 105 mEq/L (ref 96–112)
GFR, Est African American: >89 mL/min
GFR, Est Non African American: >89 mL/min
GLUCOSE: 121 mg/dL — AB (ref 70–99)
Potassium: 4.2 mEq/L (ref 3.5–5.3)
SODIUM: 139 meq/L (ref 135–145)

## 2014-04-24 LAB — HEPATIC FUNCTION PANEL
ALT: 17 U/L (ref 0–35)
AST: 20 U/L (ref 0–37)
Albumin: 4.2 g/dL (ref 3.5–5.2)
Alkaline Phosphatase: 67 U/L (ref 39–117)
BILIRUBIN DIRECT: 0.1 mg/dL (ref 0.0–0.3)
Indirect Bilirubin: 0.4 mg/dL (ref 0.2–1.2)
Total Bilirubin: 0.5 mg/dL (ref 0.2–1.2)
Total Protein: 6.2 g/dL (ref 6.0–8.3)

## 2014-04-24 LAB — HEMOGLOBIN A1C
HEMOGLOBIN A1C: 5.6 % (ref <5.7)
MEAN PLASMA GLUCOSE: 114 mg/dL (ref <117)

## 2014-04-24 LAB — URINALYSIS, MICROSCOPIC ONLY
Bacteria, UA: NONE SEEN
CASTS: NONE SEEN
Crystals: NONE SEEN

## 2014-04-24 LAB — URINE CULTURE
Colony Count: NO GROWTH
Organism ID, Bacteria: NO GROWTH

## 2014-04-24 LAB — LIPID PANEL
CHOL/HDL RATIO: 3.2 ratio
Cholesterol: 172 mg/dL (ref 0–200)
HDL: 53 mg/dL (ref >39)
LDL CALC: 106 mg/dL — AB (ref 0–99)
Triglycerides: 64 mg/dL (ref <150)
VLDL: 13 mg/dL (ref 0–40)

## 2014-04-24 LAB — INSULIN, FASTING: Insulin fasting, serum: 23.5 u[IU]/mL — ABNORMAL HIGH (ref 2.0–19.6)

## 2014-04-24 LAB — TSH: TSH: 1.112 u[IU]/mL (ref 0.350–4.500)

## 2014-04-24 MED ORDER — HYOSCYAMINE SULFATE 0.125 MG PO TABS: 0.1250 mg | ORAL_TABLET | ORAL | Status: DC | PRN

## 2014-04-24 NOTE — Telephone Encounter (Signed)
Patient states she is having bladder spasms not relieved by ASO.  Per Dr Melford Aase, [place] to send in RX for Levsin.  Patient aware.

## 2014-04-26 ENCOUNTER — Other Ambulatory Visit: Payer: Self-pay | Admitting: Internal Medicine

## 2014-04-27 ENCOUNTER — Other Ambulatory Visit: Payer: Self-pay | Admitting: Internal Medicine

## 2014-04-27 DIAGNOSIS — N23 Unspecified renal colic: Secondary | ICD-10-CM

## 2014-04-27 NOTE — Progress Notes (Signed)
S: Pt called with 2 day hx/o right LBP/Rt flank pain - sharp & stabbing - ? Fever/chills - no obvious hematuria A: Rt LB/Flank pains r/o renal colic P: schedule Renal CTscan - f/u pending results

## 2014-05-04 ENCOUNTER — Ambulatory Visit: Payer: Self-pay | Admitting: Physician Assistant

## 2014-05-08 ENCOUNTER — Encounter: Payer: Self-pay | Admitting: *Deleted

## 2014-06-30 ENCOUNTER — Other Ambulatory Visit: Payer: Self-pay | Admitting: Internal Medicine

## 2014-06-30 DIAGNOSIS — J45909 Unspecified asthma, uncomplicated: Secondary | ICD-10-CM

## 2014-08-12 ENCOUNTER — Other Ambulatory Visit: Payer: Self-pay | Admitting: Obstetrics and Gynecology

## 2014-08-13 LAB — CYTOLOGY - PAP

## 2014-08-26 ENCOUNTER — Encounter: Payer: Self-pay | Admitting: Internal Medicine

## 2014-08-26 ENCOUNTER — Other Ambulatory Visit: Payer: Self-pay | Admitting: Internal Medicine

## 2014-08-26 DIAGNOSIS — L237 Allergic contact dermatitis due to plants, except food: Secondary | ICD-10-CM

## 2014-08-26 MED ORDER — TRIAMCINOLONE ACETONIDE 0.5 % EX OINT: 1.0000 "application " | TOPICAL_OINTMENT | Freq: Four times a day (QID) | CUTANEOUS | Status: DC

## 2014-08-27 ENCOUNTER — Encounter: Payer: Self-pay | Admitting: Internal Medicine

## 2014-08-27 ENCOUNTER — Ambulatory Visit (INDEPENDENT_AMBULATORY_CARE_PROVIDER_SITE_OTHER): Payer: BLUE CROSS/BLUE SHIELD | Admitting: Internal Medicine

## 2014-08-27 VITALS — BP 138/92 | HR 90 | Temp 98.2°F | Resp 18 | Ht 63.5 in | Wt 144.0 lb

## 2014-08-27 DIAGNOSIS — R21 Rash and other nonspecific skin eruption: Secondary | ICD-10-CM

## 2014-08-27 MED ORDER — PREDNISONE 20 MG PO TABS: ORAL_TABLET | ORAL | Status: DC

## 2014-08-27 MED ORDER — TRIAMCINOLONE ACETONIDE 0.1 % EX CREA: 1.0000 "application " | TOPICAL_CREAM | Freq: Three times a day (TID) | CUTANEOUS | Status: DC

## 2014-08-27 NOTE — Patient Instructions (Signed)

## 2014-08-27 NOTE — Progress Notes (Signed)
   Subjective:    Patient ID: [name], female    DOB: 08-11-1970, 44 y.o.   MRN: [id number]  [place] Pertinent negatives include no fatigue, fever or vomiting.  Patient presents to the office for evaluation of a rash on her left leg and left arm.  She noticed some itching on Saturday afternoon and since that time she has had a big increase in itching and vessicles pop up.  She has been using some cortisone on it at home but has not had a whole lot of relief.  She was recently at the beach.  She reports that she has not used any new personal products, soaps, detergents, or new clothes.  She reports that she did get a new body wash but it is the same product that she has been using.      Review of Systems  Constitutional: Negative for fever, chills and fatigue.  Gastrointestinal: Negative for nausea and vomiting.  Skin: Positive for rash.       Objective:   Physical Exam  Constitutional: She is oriented to person, place, and time. She appears well-developed and well-nourished. No distress.  HENT:  Head: Normocephalic and atraumatic.  Mouth/Throat: Oropharynx is clear and moist. No oropharyngeal exudate.  Eyes: Conjunctivae are normal. No scleral icterus.  Neck: Normal range of motion. Neck supple. No JVD present. No thyromegaly present.  Cardiovascular: Normal rate, regular rhythm, normal heart sounds and intact distal pulses.  Exam reveals no gallop and no friction rub.   No murmur heard. Pulmonary/Chest: Effort normal and breath sounds normal. No respiratory distress. She has no wheezes. She has no rales. She exhibits no tenderness.  Abdominal: Soft. Bowel sounds are normal.  Musculoskeletal: Normal range of motion.  Lymphadenopathy:    She has no cervical adenopathy.  Neurological: She is alert and oriented to person, place, and time.  Skin: Skin is warm and dry. Rash noted. Rash is vesicular. She is not diaphoretic.     Psychiatric: She has a normal mood and affect. Her  behavior is normal. Judgment and thought content normal.  Nursing note and vitals reviewed.         Assessment & Plan:    1. Rash and nonspecific skin eruption -likely contact dermatitis -benadryl/allegra prn for itching - triamcinolone cream (KENALOG) 0.1 %; Apply 1 application topically 3 (three) times daily.  Dispense: 30 g; Refill: 0 - predniSONE (DELTASONE) 20 MG tablet; 3 tabs po day one, then 2 tabs daily x 4 days  Dispense: 11 tablet; Refill: 0

## 2014-09-22 ENCOUNTER — Encounter: Payer: Self-pay | Admitting: Internal Medicine

## 2014-09-22 ENCOUNTER — Ambulatory Visit (INDEPENDENT_AMBULATORY_CARE_PROVIDER_SITE_OTHER): Payer: BLUE CROSS/BLUE SHIELD | Admitting: Internal Medicine

## 2014-09-22 VITALS — BP 140/88 | HR 92 | Temp 98.2°F | Resp 16 | Ht 63.5 in | Wt 143.0 lb

## 2014-09-22 DIAGNOSIS — T148XXA Other injury of unspecified body region, initial encounter: Secondary | ICD-10-CM

## 2014-09-22 DIAGNOSIS — I10 Essential (primary) hypertension: Secondary | ICD-10-CM

## 2014-09-22 DIAGNOSIS — T148 Other injury of unspecified body region: Secondary | ICD-10-CM

## 2014-09-22 MED ORDER — BISOPROLOL-HYDROCHLOROTHIAZIDE 2.5-6.25 MG PO TABS: 1.0000 | ORAL_TABLET | Freq: Every day | ORAL | Status: DC

## 2014-09-22 NOTE — Patient Instructions (Signed)
Bisoprolol; Hydrochlorothiazide, HCTZ tablets What is this medicine? BISOPROLOL; HYDROCHLOROTHIAZIDE (bis OH proe lol; hye droe klor oh THYE a zide) is a combination of a beta-blocker and a diuretic. It is used to treat high blood pressure. This medicine may be used for other purposes; ask your health care provider or pharmacist if you have questions. COMMON BRAND NAME(S): Ziac What should I tell my health care provider before I take this medicine? They need to know if you have any of these conditions: -circulation problems, or blood vessel disease -decreased urine -diabetes -heart disease, heart failure or a history of heart attack -kidney disease -liver disease -lung or breathing disease, like asthma -slow heart rate -thyroid disease -an unusual or allergic reaction to hydrochlorothiazide, bisoprolol, sulfa drugs, other medicines, foods, dyes, or preservatives -pregnant or trying to get pregnant -breast-feeding How should I use this medicine? Take this medicine by mouth with a glass of water. Follow the directions on the prescription label. You can take it with or without food. If it upsets your stomach, take it with food. Take your medicine at regular intervals. Do not take it more often than directed. Do not stop taking except on your doctor's advice. Talk to your pediatrician regarding the use of this medicine in children. Special care may be needed. Overdosage: If you think you have taken too much of this medicine contact a poison control center or emergency room at once. NOTE: This medicine is only for you. Do not share this medicine with others. What if I miss a dose? If you miss a dose, take it as soon as you can. If it is almost time for your next dose, take only that dose. Do not take double or extra doses. What may interact with this medicine? -barbiturates, like phenobarbital -cholestyramine -colestipol -corticosteroids, like prednisone -lithium -medicines for chest pain  or angina -medicines for diabetes -medicines for high blood pressure or heart failure -medicines to control heart rhythm -NSAIDs, medicines for pain and inflammation, like ibuprofen or naproxen -prescription pain medicines -rifampin -skeletal muscle relaxants like tubocurarine This list may not describe all possible interactions. Give your health care provider a list of all the medicines, herbs, non-prescription drugs, or dietary supplements you use. Also tell them if you smoke, drink alcohol, or use illegal drugs. Some items may interact with your medicine. What should I watch for while using this medicine? Visit your doctor or health care professional for regular checks on your progress. Check your blood pressure as directed. Ask your doctor or health care professional what your blood pressure should be and when you should contact him or her. Check with your doctor or health care professional if you get an attack of severe diarrhea, nausea and vomiting, or if you sweat a lot. The loss of too much body fluid can make it dangerous for you to take this medicine. You may get drowsy or dizzy. Do not drive, use machinery, or do anything that needs mental alertness until you know how this drug affects you. Do not stand or sit up quickly, especially if you are an older patient. This reduces the risk of dizzy or fainting spells. Alcohol can make you more drowsy and dizzy. Avoid alcoholic drinks. This medicine may affect your blood sugar level. If you have diabetes, check with your doctor or health care professional before changing the dose of your diabetic medicine. This medicine can make you more sensitive to the sun. Keep out of the sun. If you cannot avoid being in the  sun, wear protective clothing and use sunscreen. Do not use sun lamps or tanning beds/booths. Do not treat yourself for coughs, colds, or pain while you are taking this medicine without asking your doctor or health care professional for  advice. Some ingredients may increase your blood pressure. What side effects may I notice from receiving this medicine? Side effects that you should report to your doctor or health care professional as soon as possible: -allergic reactions like skin rash, itching or hives, swelling of the face, lips, or tongue -breathing problems -changes in vision -chest pain -cold, tingling, or numb hands or feet -eye pain -fast, irregular, or slow heartbeat -increased thirst or sweating -muscle cramps -redness, blistering, peeling or loosening of the skin, including inside the mouth -swollen legs or ankles -tremors -unusual bruising -unusual weak or tired -vomiting -worsened gout pain -yellowing of the eyes or skin Side effects that usually do not require medical attention (report to your doctor or health care professional if they continue or are bothersome): -change in sex drive or performance -cough -depression -diarrhea -nausea This list may not describe all possible side effects. Call your doctor for medical advice about side effects. You may report side effects to FDA at 1-800-FDA-1088. Where should I keep my medicine? Keep out of the reach of children. Store at room temperature between 15 and 30 degrees C (59 and 86 degrees F). Keep container tightly closed. Throw away any unused medicine after the expiration date. NOTE: This sheet is a summary. It may not cover all possible information. If you have questions about this medicine, talk to your doctor, pharmacist, or health care provider.  2015, [employer]vier/[employer] Standard. (2009-11-17 12:53:54)

## 2014-09-22 NOTE — Progress Notes (Signed)
   Subjective:    Patient ID: [name], female    DOB: 24-Apr-1970, 44 y.o.   MRN: [id number]  Hypertension Pertinent negatives include no chest pain, headaches, palpitations or shortness of breath.  Patient reports that she has been having elevated blood pressure for the past couple weeks.  She reports that it has been runnin 120-140/85-100.  She reports that she has been under a lot of stress between work and her job and her church.  She reports that she has not had the opportunity to do a whole lot of exercise.    She also reports that she has been having some right leg swelling for approximately 2-3 weeks.  She reports that she knocked her leg on the edge of the [place].  She reports that since that time she has been having swelling.  She reports that she has been wearing her compression stocking which helps a little bit. She has been elevating the leg at night time. She did recently drive four hours.  She reports no surgeries, no estrogen use, no immobilization, no confirmed blood clot.  She does take ASA every other day.       Review of Systems  Constitutional: Negative for fever, chills and fatigue.  Respiratory: Negative for chest tightness and shortness of breath.   Cardiovascular: Positive for leg swelling. Negative for chest pain and palpitations.  Neurological: Negative for dizziness, light-headedness and headaches.       Objective:   Physical Exam  Constitutional: She is oriented to person, place, and time. She appears well-developed and well-nourished. No distress.  HENT:  Head: Normocephalic and atraumatic.  Mouth/Throat: Oropharynx is clear and moist. No oropharyngeal exudate.  Eyes: Conjunctivae are normal. No scleral icterus.  Neck: Normal range of motion. Neck supple. No JVD present. No thyromegaly present.  Cardiovascular: Normal rate, regular rhythm, normal heart sounds and intact distal pulses.  Exam reveals no gallop and no friction rub.   No murmur  heard. Pulmonary/Chest: Effort normal and breath sounds normal. No respiratory distress. She has no wheezes. She has no rales. She exhibits no tenderness.  Musculoskeletal: Normal range of motion.       Legs: Lymphadenopathy:    She has no cervical adenopathy.  Neurological: She is alert and oriented to person, place, and time.  Skin: Skin is warm and dry. She is not diaphoretic.  Psychiatric: She has a normal mood and affect. Her behavior is normal. Judgment and thought content normal.  Nursing note and vitals reviewed.         Assessment & Plan:    1. Essential hypertension -likely related to anxiety -ziac 2.5-6.25 -recheck in 2 weeks via mychart message  2. Hematoma -RICE therapy -wells criteria negative -blood clot unlikely, at least a more convincing reason for unilateral swelling possible.

## 2014-10-12 ENCOUNTER — Other Ambulatory Visit: Payer: Self-pay | Admitting: Internal Medicine

## 2014-10-17 ENCOUNTER — Encounter: Payer: Self-pay | Admitting: Internal Medicine

## 2014-11-20 ENCOUNTER — Other Ambulatory Visit: Payer: Self-pay | Admitting: Internal Medicine

## 2014-11-26 ENCOUNTER — Encounter: Payer: Self-pay | Admitting: Physician Assistant

## 2014-12-01 ENCOUNTER — Encounter: Payer: Self-pay | Admitting: Physician Assistant

## 2014-12-01 ENCOUNTER — Ambulatory Visit (INDEPENDENT_AMBULATORY_CARE_PROVIDER_SITE_OTHER): Payer: BLUE CROSS/BLUE SHIELD | Admitting: Internal Medicine

## 2014-12-01 VITALS — BP 110/70 | HR 70 | Temp 97.9°F | Resp 16 | Ht 63.0 in | Wt 142.6 lb

## 2014-12-01 DIAGNOSIS — Z136 Encounter for screening for cardiovascular disorders: Secondary | ICD-10-CM

## 2014-12-01 DIAGNOSIS — I10 Essential (primary) hypertension: Secondary | ICD-10-CM

## 2014-12-01 DIAGNOSIS — Z79899 Other long term (current) drug therapy: Secondary | ICD-10-CM | POA: Diagnosis not present

## 2014-12-01 DIAGNOSIS — E559 Vitamin D deficiency, unspecified: Secondary | ICD-10-CM

## 2014-12-01 DIAGNOSIS — Z0000 Encounter for general adult medical examination without abnormal findings: Secondary | ICD-10-CM

## 2014-12-01 DIAGNOSIS — Z130 Encounter for screening for diseases of the blood and blood-forming organs and certain disorders involving the immune mechanism: Secondary | ICD-10-CM

## 2014-12-01 DIAGNOSIS — Z1212 Encounter for screening for malignant neoplasm of rectum: Secondary | ICD-10-CM

## 2014-12-01 DIAGNOSIS — Z0001 Encounter for general adult medical examination with abnormal findings: Secondary | ICD-10-CM

## 2014-12-01 DIAGNOSIS — E785 Hyperlipidemia, unspecified: Secondary | ICD-10-CM

## 2014-12-01 DIAGNOSIS — Z1329 Encounter for screening for other suspected endocrine disorder: Secondary | ICD-10-CM

## 2014-12-01 DIAGNOSIS — R7303 Prediabetes: Secondary | ICD-10-CM

## 2014-12-01 DIAGNOSIS — Z1389 Encounter for screening for other disorder: Secondary | ICD-10-CM

## 2014-12-01 LAB — IRON AND TIBC
%SAT: 35 % (ref 11–50)
IRON: 115 ug/dL (ref 40–190)
TIBC: 332 ug/dL (ref 250–450)
UIBC: 217 ug/dL (ref 125–400)

## 2014-12-01 LAB — VITAMIN B12: VITAMIN B 12: 449 pg/mL (ref 211–911)

## 2014-12-01 LAB — TSH: TSH: 1.079 u[IU]/mL (ref 0.350–4.500)

## 2014-12-01 LAB — BASIC METABOLIC PANEL WITH GFR
BUN: 16 mg/dL (ref 7–25)
CO2: 25 mmol/L (ref 20–31)
CREATININE: 0.54 mg/dL (ref 0.50–1.10)
Calcium: 9 mg/dL (ref 8.6–10.2)
Chloride: 102 mmol/L (ref 98–110)
GFR, Est Non African American: >89 mL/min (ref >=60)
Glucose, Bld: 79 mg/dL (ref 65–99)
Potassium: 4.2 mmol/L (ref 3.5–5.3)
SODIUM: 136 mmol/L (ref 135–146)

## 2014-12-01 LAB — CBC WITH DIFFERENTIAL/PLATELET
BASOS ABS: 0 10*3/uL (ref 0.0–0.1)
BASOS PCT: 0 % (ref 0–1)
EOS ABS: 0.1 10*3/uL (ref 0.0–0.7)
EOS PCT: 1 % (ref 0–5)
HCT: 38.7 % (ref 36.0–46.0)
Hemoglobin: 13.3 g/dL (ref 12.0–15.0)
LYMPHS ABS: 1.6 10*3/uL (ref 0.7–4.0)
Lymphocytes Relative: 27 % (ref 12–46)
MCH: 28.9 pg (ref 26.0–34.0)
MCHC: 34.4 g/dL (ref 30.0–36.0)
MCV: 84.1 fL (ref 78.0–100.0)
MONOS PCT: 8 % (ref 3–12)
MPV: 10 fL (ref 8.6–12.4)
Monocytes Absolute: 0.5 10*3/uL (ref 0.1–1.0)
NEUTROS PCT: 64 % (ref 43–77)
Neutro Abs: 3.8 10*3/uL (ref 1.7–7.7)
PLATELETS: 276 10*3/uL (ref 150–400)
RBC: 4.6 MIL/uL (ref 3.87–5.11)
RDW: 13.7 % (ref 11.5–15.5)
WBC: 5.9 10*3/uL (ref 4.0–10.5)

## 2014-12-01 LAB — HEPATIC FUNCTION PANEL
ALBUMIN: 4.3 g/dL (ref 3.6–5.1)
ALT: 12 U/L (ref 6–29)
AST: 15 U/L (ref 10–30)
Alkaline Phosphatase: 64 U/L (ref 33–115)
BILIRUBIN DIRECT: 0.1 mg/dL (ref <=0.2)
Indirect Bilirubin: 0.5 mg/dL (ref 0.2–1.2)
Total Bilirubin: 0.6 mg/dL (ref 0.2–1.2)
Total Protein: 6.2 g/dL (ref 6.1–8.1)

## 2014-12-01 LAB — HEMOGLOBIN A1C
Hgb A1c MFr Bld: 5.7 % — ABNORMAL HIGH (ref <5.7)
Mean Plasma Glucose: 117 mg/dL — ABNORMAL HIGH (ref <117)

## 2014-12-01 LAB — LIPID PANEL
CHOL/HDL RATIO: 4.8 ratio (ref <=5.0)
CHOLESTEROL: 196 mg/dL (ref 125–200)
HDL: 41 mg/dL — AB (ref >=46)
LDL Cholesterol: 133 mg/dL — ABNORMAL HIGH (ref <130)
TRIGLYCERIDES: 111 mg/dL (ref <150)
VLDL: 22 mg/dL (ref <30)

## 2014-12-01 LAB — MAGNESIUM: MAGNESIUM: 2.1 mg/dL (ref 1.5–2.5)

## 2014-12-01 MED ORDER — BISOPROLOL-HYDROCHLOROTHIAZIDE 2.5-6.25 MG PO TABS: 1.0000 | ORAL_TABLET | Freq: Every day | ORAL | Status: DC

## 2014-12-01 NOTE — Progress Notes (Signed)
Patient ID: [name], female   DOB: 09/27/1970, 44 y.o.   MRN: [id number]  Complete Physical  Assessment and Plan:  1. Prediabetes  - Hemoglobin A1c - Insulin, random  2. Hyperlipidemia  - Lipid panel  3. Vitamin D deficiency  - Vit D  25 hydroxy (rtn osteoporosis monitoring)  4. Medication management  - CBC with Differential/Platelet - BASIC METABOLIC PANEL WITH GFR - Hepatic function panel - Magnesium  5. Encounter for general adult medical examination with abnormal findings   6. Screening for deficiency anemia  - Iron and TIBC - Vitamin B12  7. Screening for rectal cancer  - POC Hemoccult Bld/Stl (3-Cd Home Screen); Future  8. Screening for cardiovascular condition  - EKG 12-Lead  9. Screening for thyroid disorder  - TSH  10. Screening for hematuria or proteinuria  - Urinalysis, Routine w reflex microscopic (not at [facility]) - Microalbumin / creatinine urine ratio    Discussed med's effects and SE's. Screening labs and tests as requested with regular follow-up as recommended.  HPI  44 y.o. female  presents for a complete physical.  Her blood pressure has been controlled at home, today their BP is BP: 110/70 mmHg.  She does workout. She denies chest pain, shortness of breath, dizziness. She has been running.    She is on cholesterol medication and denies myalgias. Her cholesterol is at goal. The cholesterol last visit was:  Lab Results  Component Value Date   CHOL 172 04/23/2014   HDL 53 04/23/2014   LDLCALC 106* 04/23/2014   TRIG 64 04/23/2014   CHOLHDL 3.2 04/23/2014  .  She has been working on diet and exercise for prediabetes, she is on bASA, she is on ACE/ARB and denies foot ulcerations, hyperglycemia, hypoglycemia , increased appetite, nausea, paresthesia of the feet, polydipsia, polyuria, visual disturbances, vomiting and weight loss. Last A1C in the office was:  Lab Results  Component Value Date   HGBA1C 5.6 04/23/2014    Patient is  on Vitamin D supplement.   Lab Results  Component Value Date   VD25OH 37 04/23/2014     Patient reports that she is having mammograms through her OBGyn and has had all normal mammograms.  She reports that she also had a pelvic exam this year that was normal. No history of abnormal pap smears.    No change in family history.  No family history of colon cancer.     Current Medications:  Current Outpatient Prescriptions on File Prior to Visit  Medication Sig Dispense Refill  . AMITIZA 8 MCG capsule TAKE ONE CAPSULE BY MOUTH TWICE DAILY FOR IBS 60 capsule 99  . aspirin 81 MG tablet Take 81 mg by mouth See admin instructions. Takes 1 tablet on mondays, wednesdays, and fridays    . bisoprolol-hydrochlorothiazide (ZIAC) 2.5-6.25 MG per tablet Take 1 tablet by mouth daily. 30 tablet 2  . cholecalciferol (VITAMIN D) 1000 UNITS tablet Take 2,000 Units by mouth. Takes 3 times per week    . fexofenadine (ALLEGRA) 180 MG tablet Take 180 mg by mouth. Takes QOD prn    . gemfibrozil (LOPID) 600 MG tablet TAKE ONE TABLET BY MOUTH TWICE DAILY FOR  CHOLESTEROL 180 tablet 0  . guaiFENesin (MUCINEX) 600 MG 12 hr tablet Take 600 mg by mouth 2 (two) times daily.     . Magnesium 400 MG CAPS Take 400 mg by mouth daily.    . meloxicam (MOBIC) 15 MG tablet TAKE ONE TABLET BY MOUTH ONCE DAILY  AS NEEDED 90 tablet 99  . minocycline (DYNACIN) 100 MG tablet Take 100 mg by mouth. Takes QOD    . polyethylene glycol (MIRALAX / GLYCOLAX) packet Take 17 g by mouth daily.    . pseudoephedrine (SUDAFED) 30 MG tablet Take 30 mg by mouth every 4 (four) hours as needed. congestion    . QVAR 80 MCG/ACT inhaler INHALE ONE TO TWO PUFFS TWICE DAILY (Patient not taking: Reported on 09/22/2014) 9 g 99  . vitamin E 400 UNIT capsule Take 400 Units by mouth 2 (two) times daily.     No current facility-administered medications on file prior to visit.    Health Maintenance:   Immunization History  Administered Date(s) Administered  .  Influenza-Unspecified 12/26/2013, 11/26/2014  . PPD Test 11/25/2013    Patient Care Team: Unk Pinto, MD as PCP - General (Internal Medicine) Jerene Bears, MD as Referring Physician (Gastroenterology) Jimmey Ralph, NP as Nurse Practitioner (Urology) Everlene Farrier, MD as Consulting Physician (Obstetrics and Gynecology) Druscilla Brownie, MD as Consulting Physician (Dermatology) Inocencio Homes, DPM as Consulting Physician (Podiatry)  Allergies:  Allergies  Allergen Reactions  . Food     strawberries    Medical History:  Past Medical History  Diagnosis Date  . Asthma   . IBS (irritable bowel syndrome)   . HLD (hyperlipidemia)   . Elevated cholesterol   . Acne   . GERD (gastroesophageal reflux disease)   . Nausea   . Difficulty sleeping     due to discomfort  . Chronic kidney disease   . Arthritis     Surgical History:  Past Surgical History  Procedure Laterality Date  . Ectopic pregnancy surgery  2005  . Partial hysterectomy  2009  . Esophagogastroduodenoscopy  06/13/2011    Procedure: ESOPHAGOGASTRODUODENOSCOPY (EGD);  Surgeon: Jerene Bears, MD;  Location: [facility];  Service: Gastroenterology;  Laterality: N/A;  . Cholecystectomy  07/27/2011    Procedure: LAPAROSCOPIC CHOLECYSTECTOMY WITH INTRAOPERATIVE CHOLANGIOGRAM;  Surgeon: Shann Medal, MD;  Location: [facility];  Service: General;  Laterality: N/A;    Family History:  Family History  Problem Relation Age of Onset  . Breast cancer Mother   . Lymphoma Mother   . Migraines Mother   . Hyperlipidemia Mother   . Stroke Maternal Grandmother   . Diabetes type II Father   . Diabetes Father   . Migraines Sister   . Hyperlipidemia Sister     Social History:  Social History  Substance Use Topics  . Smoking status: Never Smoker   . Smokeless tobacco: Never Used  . Alcohol Use: No    Review of Systems: Review of Systems  Constitutional: Negative for fever, chills and malaise/fatigue.  HENT: Negative for  congestion, ear pain and sore throat.   Eyes: Negative for blurred vision and double vision.  Respiratory: Negative for cough, shortness of breath and wheezing.   Cardiovascular: Negative for chest pain, palpitations and leg swelling.  Gastrointestinal: Positive for constipation. Negative for heartburn, nausea, vomiting, diarrhea, blood in stool and melena.  Genitourinary: Negative for dysuria, urgency, frequency and hematuria.  Skin: Negative.   Neurological: Negative for dizziness, sensory change, loss of consciousness and headaches.  Psychiatric/Behavioral: Negative for depression. The patient is not nervous/anxious and does not have insomnia.     Physical Exam: Estimated body mass index is 25.27 kg/(m^2) as calculated from the following:   Height as of this encounter: 5\' 3"  (1.6 m).   Weight as of this encounter: 142 lb 9.6  oz (64.683 kg). BP 110/70 mmHg  Pulse 70  Temp(Src) 97.9 F (36.6 C)  Resp 16  Ht 5\' 3"  (1.6 m)  Wt 142 lb 9.6 oz (64.683 kg)  BMI 25.27 kg/m2  General Appearance: Well nourished well developed, in no apparent distress.  Eyes: PERRLA, EOMs, conjunctiva no swelling or erythema ENT/Mouth: Ear canals normal without obstruction, swelling, erythema, or discharge.  TMs normal bilaterally with no erythema, bulging, retraction, or loss of landmark.  Oropharynx moist and clear with no exudate, erythema, or swelling.   Neck: Supple, thyroid normal. No bruits.  No cervical adenopathy Respiratory: Respiratory effort normal, Breath sounds clear A&P without wheeze, rhonchi, rales.   Cardio: RRR without murmurs, rubs or gallops. Brisk peripheral pulses without edema.  Chest: symmetric, with normal excursions Abdomen: Soft, nontender, no guarding, rebound, hernias, masses, or organomegaly.  Lymphatics: Non tender without lymphadenopathy.  Musculoskeletal: Full ROM all peripheral extremities,5/5 strength, and normal gait.  Skin: Warm, dry without rashes, lesions,  ecchymosis. Neuro: Awake and oriented X 3, Cranial nerves intact, reflexes equal bilaterally. Normal muscle tone, no cerebellar symptoms. Sensation intact.  Psych:  normal affect, Insight and Judgment appropriate.   EKG: WNL no changes.  Over 40 minutes of exam, counseling, chart review and critical decision making was performed  Starlyn Skeans 9:26 AM [facility]

## 2014-12-01 NOTE — Patient Instructions (Signed)

## 2014-12-02 LAB — URINALYSIS, ROUTINE W REFLEX MICROSCOPIC
BILIRUBIN URINE: NEGATIVE
GLUCOSE, UA: NEGATIVE
Hgb urine dipstick: NEGATIVE
KETONES UR: NEGATIVE
Leukocytes, UA: NEGATIVE
Nitrite: NEGATIVE
PROTEIN: NEGATIVE
Specific Gravity, Urine: 1.007 (ref 1.001–1.035)
pH: 7 (ref 5.0–8.0)

## 2014-12-02 LAB — MICROALBUMIN / CREATININE URINE RATIO
Creatinine, Urine: 25.1 mg/dL
Microalb, Ur: <0.2 mg/dL (ref <2.0)

## 2014-12-02 LAB — VITAMIN D 25 HYDROXY (VIT D DEFICIENCY, FRACTURES): VIT D 25 HYDROXY: 43 ng/mL (ref 30–100)

## 2014-12-02 LAB — INSULIN, RANDOM: Insulin: 4.5 u[IU]/mL (ref 2.0–19.6)

## 2015-01-10 ENCOUNTER — Other Ambulatory Visit: Payer: Self-pay | Admitting: Internal Medicine

## 2015-01-20 ENCOUNTER — Other Ambulatory Visit: Payer: Self-pay | Admitting: Internal Medicine

## 2015-02-19 ENCOUNTER — Ambulatory Visit
Admission: RE | Admit: 2015-02-19 | Discharge: 2015-02-19 | Disposition: A | Discharge status: Home / Self Care | Payer: BLUE CROSS/BLUE SHIELD | Source: Ambulatory Visit | Attending: Internal Medicine | Admitting: Internal Medicine

## 2015-02-19 ENCOUNTER — Ambulatory Visit (INDEPENDENT_AMBULATORY_CARE_PROVIDER_SITE_OTHER): Payer: BLUE CROSS/BLUE SHIELD | Admitting: Internal Medicine

## 2015-02-19 ENCOUNTER — Encounter: Payer: Self-pay | Admitting: Internal Medicine

## 2015-02-19 VITALS — BP 116/80 | HR 58 | Temp 98.2°F | Resp 18 | Ht 63.5 in | Wt 141.0 lb

## 2015-02-19 DIAGNOSIS — R109 Unspecified abdominal pain: Secondary | ICD-10-CM

## 2015-02-19 LAB — COMPREHENSIVE METABOLIC PANEL
ALBUMIN: 4.3 g/dL (ref 3.6–5.1)
ALT: 12 U/L (ref 6–29)
AST: 15 U/L (ref 10–30)
Alkaline Phosphatase: 63 U/L (ref 33–115)
BUN: 14 mg/dL (ref 7–25)
CALCIUM: 9.2 mg/dL (ref 8.6–10.2)
CHLORIDE: 104 mmol/L (ref 98–110)
CO2: 26 mmol/L (ref 20–31)
Creat: 0.68 mg/dL (ref 0.50–1.10)
Glucose, Bld: 86 mg/dL (ref 65–99)
Potassium: 4.5 mmol/L (ref 3.5–5.3)
Sodium: 138 mmol/L (ref 135–146)
TOTAL PROTEIN: 6.4 g/dL (ref 6.1–8.1)
Total Bilirubin: 0.4 mg/dL (ref 0.2–1.2)

## 2015-02-19 LAB — CBC WITH DIFFERENTIAL/PLATELET
BASOS ABS: 0 10*3/uL (ref 0.0–0.1)
Basophils Relative: 0 % (ref 0–1)
EOS ABS: 0.1 10*3/uL (ref 0.0–0.7)
Eosinophils Relative: 2 % (ref 0–5)
HEMATOCRIT: 42.2 % (ref 36.0–46.0)
Hemoglobin: 14.2 g/dL (ref 12.0–15.0)
LYMPHS ABS: 1.9 10*3/uL (ref 0.7–4.0)
LYMPHS PCT: 31 % (ref 12–46)
MCH: 28.6 pg (ref 26.0–34.0)
MCHC: 33.6 g/dL (ref 30.0–36.0)
MCV: 85.1 fL (ref 78.0–100.0)
MPV: 10.2 fL (ref 8.6–12.4)
Monocytes Absolute: 0.4 10*3/uL (ref 0.1–1.0)
Monocytes Relative: 7 % (ref 3–12)
NEUTROS PCT: 60 % (ref 43–77)
Neutro Abs: 3.7 10*3/uL (ref 1.7–7.7)
PLATELETS: 280 10*3/uL (ref 150–400)
RBC: 4.96 MIL/uL (ref 3.87–5.11)
RDW: 13 % (ref 11.5–15.5)
WBC: 6.2 10*3/uL (ref 4.0–10.5)

## 2015-02-19 MED ORDER — TRAMADOL HCL 50 MG PO TABS: 50.0000 mg | ORAL_TABLET | Freq: Four times a day (QID) | ORAL | Status: DC | PRN

## 2015-02-19 NOTE — Progress Notes (Signed)
Subjective:    Patient ID: [name], female    DOB: September 26, 1970, 44 y.o.   MRN: [id number]  Abdominal Pain Associated symptoms include frequency and nausea. Pertinent negatives include no constipation, diarrhea, dysuria, fever, hematuria or vomiting.   Patient presents to the office for evaluation of abdominal pain in the RUQ which has been going on for the past 5 days.  She reports that the pain starts around 10-15 minutes after eating. She reports that the pain nearly radiates to her right flank.  She reports that she did have some urinary restriction which has been going on but that has improved.  It is intermittent similar to pain she has had from kidney stone.  She reports that the pain is not that bad.  She reports that the pain is a dull ache.  She reports that she is nauseated and she hasn't been able to eat much. She reports that she has had a cholecystectomy.  She does not have a history of acid reflux.  She reports that she has had no vomiting.  No sick contacts.  No changes in diet. She reports low grade temps of 99.4, 99.8.      Review of Systems  Constitutional: Negative for fever and chills.  Gastrointestinal: Positive for nausea and abdominal pain. Negative for vomiting, diarrhea, constipation, blood in stool and anal bleeding.  Genitourinary: Positive for frequency and flank pain. Negative for dysuria, urgency, hematuria and difficulty urinating.       Objective:   Physical Exam  Constitutional: She is oriented to person, place, and time. She appears well-developed and well-nourished. No distress.  HENT:  Head: Normocephalic.  Mouth/Throat: Oropharynx is clear and moist. No oropharyngeal exudate.  Eyes: Conjunctivae are normal. No scleral icterus.  Neck: Normal range of motion. Neck supple. No JVD present. No thyromegaly present.  Cardiovascular: Normal rate, regular rhythm and intact distal pulses.  Exam reveals no gallop and no friction rub.   No murmur  heard. Pulmonary/Chest: Effort normal and breath sounds normal. No respiratory distress. She has no wheezes. She has no rales. She exhibits no tenderness.  Abdominal: Soft. Normal appearance and bowel sounds are normal. She exhibits no distension and no mass. There is tenderness in the right lower quadrant and epigastric area. There is no rebound, no guarding and no CVA tenderness.  Tenderness in RLQ without rebound and all over right quadrant, not just mcburney's.  Musculoskeletal: Normal range of motion.  Lymphadenopathy:    She has no cervical adenopathy.  Neurological: She is alert and oriented to person, place, and time.  Skin: Skin is warm and dry. She is not diaphoretic.  Psychiatric: She has a normal mood and affect. Her behavior is normal. Judgment and thought content normal.  Nursing note and vitals reviewed.   Filed Vitals:   02/19/15 1016  BP: 116/80  Pulse: 58  Temp: 98.2 F (36.8 C)  Resp: 18          Assessment & Plan:    1. Right flank pain -unclear etiology at this time.  Vitals do not suggest infective mechanism at this time.  To get ultrasound this afternoon.  Omeprazole for epigastric pain and will look for possible kidney stone vs. Ovarian cyst.  Patient given strict instructions to go to ER for intractable pain, nausea, vomiting, or gross hematuria.  She stated understanding.  Will treat pain currently with ultram per patient's request.    - Urinalysis, Routine w reflex microscopic (not at [facility]) -  Culture, Urine - Comprehensive metabolic panel - CBC with Differential/Platelet - traMADol (ULTRAM) 50 MG tablet; Take 1 tablet (50 mg total) by mouth every 6 (six) hours as needed.  Dispense: 30 tablet; Refill: 0 - US Abdomen Complete; Future

## 2015-02-20 LAB — URINALYSIS, ROUTINE W REFLEX MICROSCOPIC
Bilirubin Urine: NEGATIVE
Glucose, UA: NEGATIVE
Hgb urine dipstick: NEGATIVE
Ketones, ur: NEGATIVE
Leukocytes, UA: NEGATIVE
Nitrite: NEGATIVE
Protein, ur: NEGATIVE
Specific Gravity, Urine: 1.005 (ref 1.001–1.035)
pH: 6.5 (ref 5.0–8.0)

## 2015-02-20 LAB — URINE CULTURE
Colony Count: NO GROWTH
Organism ID, Bacteria: NO GROWTH

## 2015-02-22 ENCOUNTER — Other Ambulatory Visit: Payer: Self-pay | Admitting: Internal Medicine

## 2015-02-22 DIAGNOSIS — N200 Calculus of kidney: Secondary | ICD-10-CM

## 2015-04-19 ENCOUNTER — Other Ambulatory Visit: Payer: Self-pay | Admitting: Internal Medicine

## 2015-05-16 ENCOUNTER — Other Ambulatory Visit: Payer: Self-pay | Admitting: Internal Medicine

## 2015-06-02 ENCOUNTER — Ambulatory Visit: Payer: Self-pay | Admitting: Internal Medicine

## 2015-06-10 ENCOUNTER — Ambulatory Visit (INDEPENDENT_AMBULATORY_CARE_PROVIDER_SITE_OTHER): Payer: BLUE CROSS/BLUE SHIELD | Admitting: Internal Medicine

## 2015-06-10 ENCOUNTER — Encounter: Payer: Self-pay | Admitting: Internal Medicine

## 2015-06-10 VITALS — BP 104/62 | HR 64 | Temp 98.2°F | Resp 16 | Ht 63.0 in | Wt 140.0 lb

## 2015-06-10 DIAGNOSIS — R7303 Prediabetes: Secondary | ICD-10-CM

## 2015-06-10 DIAGNOSIS — E785 Hyperlipidemia, unspecified: Secondary | ICD-10-CM | POA: Diagnosis not present

## 2015-06-10 DIAGNOSIS — Z79899 Other long term (current) drug therapy: Secondary | ICD-10-CM

## 2015-06-10 DIAGNOSIS — H6593 Unspecified nonsuppurative otitis media, bilateral: Secondary | ICD-10-CM | POA: Diagnosis not present

## 2015-06-10 LAB — CBC WITH DIFFERENTIAL/PLATELET
BASOS ABS: 0 10*3/uL (ref 0.0–0.1)
Basophils Relative: 0 % (ref 0–1)
Eosinophils Absolute: 0.1 10*3/uL (ref 0.0–0.7)
Eosinophils Relative: 1 % (ref 0–5)
HEMATOCRIT: 42.5 % (ref 36.0–46.0)
HEMOGLOBIN: 14.3 g/dL (ref 12.0–15.0)
LYMPHS PCT: 12 % (ref 12–46)
Lymphs Abs: 1.1 10*3/uL (ref 0.7–4.0)
MCH: 28.4 pg (ref 26.0–34.0)
MCHC: 33.6 g/dL (ref 30.0–36.0)
MCV: 84.3 fL (ref 78.0–100.0)
MONO ABS: 0.4 10*3/uL (ref 0.1–1.0)
MPV: 10.4 fL (ref 8.6–12.4)
Monocytes Relative: 4 % (ref 3–12)
NEUTROS ABS: 7.3 10*3/uL (ref 1.7–7.7)
NEUTROS PCT: 83 % — AB (ref 43–77)
Platelets: 252 10*3/uL (ref 150–400)
RBC: 5.04 MIL/uL (ref 3.87–5.11)
RDW: 13.3 % (ref 11.5–15.5)
WBC: 8.8 10*3/uL (ref 4.0–10.5)

## 2015-06-10 LAB — LIPID PANEL
CHOL/HDL RATIO: 4.7 ratio (ref <=5.0)
CHOLESTEROL: 194 mg/dL (ref 125–200)
HDL: 41 mg/dL — AB (ref >=46)
LDL Cholesterol: 131 mg/dL — ABNORMAL HIGH (ref <130)
Triglycerides: 112 mg/dL (ref <150)
VLDL: 22 mg/dL (ref <30)

## 2015-06-10 LAB — HEPATIC FUNCTION PANEL
ALBUMIN: 4.4 g/dL (ref 3.6–5.1)
ALT: 9 U/L (ref 6–29)
AST: 15 U/L (ref 10–30)
Alkaline Phosphatase: 60 U/L (ref 33–115)
BILIRUBIN DIRECT: 0.1 mg/dL (ref <=0.2)
BILIRUBIN TOTAL: 0.8 mg/dL (ref 0.2–1.2)
Indirect Bilirubin: 0.7 mg/dL (ref 0.2–1.2)
Total Protein: 6.4 g/dL (ref 6.1–8.1)

## 2015-06-10 LAB — BASIC METABOLIC PANEL WITH GFR
BUN: 17 mg/dL (ref 7–25)
CHLORIDE: 101 mmol/L (ref 98–110)
CO2: 24 mmol/L (ref 20–31)
Calcium: 9.1 mg/dL (ref 8.6–10.2)
Creat: 0.63 mg/dL (ref 0.50–1.10)
GFR, Est African American: >89 mL/min (ref >=60)
GLUCOSE: 83 mg/dL (ref 65–99)
POTASSIUM: 4.1 mmol/L (ref 3.5–5.3)
SODIUM: 140 mmol/L (ref 135–146)

## 2015-06-10 NOTE — Progress Notes (Signed)
Assessment and Plan:  Hypertension:  -Continue medication,  -monitor blood pressure at home.  -Continue DASH diet.   -Reminder to go to the ER if any CP, SOB, nausea, dizziness, severe HA, changes vision/speech, left arm numbness and tingling, and jaw pain.  Cholesterol: -Continue diet and exercise.  -Check cholesterol.   Pre-diabetes: -Continue diet and exercise.  -Check A1C  Vitamin D Def: -check level -continue medications.   Middle ear effusions -cont full tablet of sudafed -nasal saline rinse -allegra -nasacort daily x 2 weeks  Continue diet and meds as discussed. Further disposition pending results of labs.  HPI 45 y.o. female  presents for 3 month follow up with hypertension, hyperlipidemia, prediabetes and vitamin D.   Her blood pressure has been controlled at home, today their BP is BP: 104/62 mmHg.   She does workout. She denies chest pain, shortness of breath, dizziness.   She is on cholesterol medication and denies myalgias. Her cholesterol is at goal. The cholesterol last visit was:   Lab Results  Component Value Date   CHOL 196 12/01/2014   HDL 41* 12/01/2014   LDLCALC 133* 12/01/2014   TRIG 111 12/01/2014   CHOLHDL 4.8 12/01/2014     She has been working on diet and exercise for prediabetes, and denies foot ulcerations, hyperglycemia, hypoglycemia , increased appetite, nausea, paresthesia of the feet, polydipsia, polyuria, visual disturbances, vomiting and weight loss. Last A1C in the office was:  Lab Results  Component Value Date   HGBA1C 5.7* 12/01/2014    Patient is on Vitamin D supplement.  Lab Results  Component Value Date   VD25OH 41 12/01/2014      She has been having some issues with bilateral intermittent ear pain.  This started two days ago.  She has taken tylenol for it with some relief.  Gets better throughout the day.    Current Medications:  Current Outpatient Prescriptions on File Prior to Visit  Medication Sig Dispense Refill   . AMITIZA 8 MCG capsule TAKE ONE CAPSULE BY MOUTH TWICE DAILY FOR  IBS 180 capsule 1  . aspirin 81 MG tablet Take 81 mg by mouth See admin instructions. Takes 1 tablet on mondays, wednesdays, and fridays    . bisoprolol-hydrochlorothiazide (ZIAC) 2.5-6.25 MG per tablet Take 1 tablet by mouth daily. 90 tablet 2  . cholecalciferol (VITAMIN D) 1000 UNITS tablet Take 2,000 Units by mouth. Takes 3 times per week    . fexofenadine (ALLEGRA) 180 MG tablet Take 180 mg by mouth. Takes QOD prn    . gemfibrozil (LOPID) 600 MG tablet TAKE ONE TABLET BY MOUTH TWICE DAILY FOR CHOLESTEROL 180 tablet 3  . guaiFENesin (MUCINEX) 600 MG 12 hr tablet Take 600 mg by mouth 2 (two) times daily.     . Magnesium 400 MG CAPS Take 400 mg by mouth daily.    . meloxicam (MOBIC) 15 MG tablet TAKE ONE TABLET BY MOUTH ONCE DAILY AS NEEDED 90 tablet 99  . minocycline (DYNACIN) 100 MG tablet Take 100 mg by mouth. Takes QOD    . polyethylene glycol (MIRALAX / GLYCOLAX) packet Take 17 g by mouth daily.    Marland Kitchen QVAR 80 MCG/ACT inhaler INHALE ONE TO TWO PUFFS TWICE DAILY 9 g 99  . traMADol (ULTRAM) 50 MG tablet Take 1 tablet (50 mg total) by mouth every 6 (six) hours as needed. 30 tablet 0  . vitamin E 400 UNIT capsule Take 400 Units by mouth 2 (two) times daily.  No current facility-administered medications on file prior to visit.    Medical History:  Past Medical History  Diagnosis Date  . Asthma   . IBS (irritable bowel syndrome)   . HLD (hyperlipidemia)   . Elevated cholesterol   . Acne   . GERD (gastroesophageal reflux disease)   . Nausea   . Difficulty sleeping     due to discomfort  . Chronic kidney disease   . Arthritis     Allergies:  Allergies  Allergen Reactions  . Food     strawberries     Review of Systems:  Review of Systems  Constitutional: Negative for fever, chills and malaise/fatigue.  HENT: Negative for congestion, ear pain and sore throat.   Respiratory: Negative for cough, shortness of  breath and wheezing.   Cardiovascular: Negative for chest pain, palpitations and leg swelling.  Gastrointestinal: Negative for heartburn, abdominal pain, diarrhea, constipation, blood in stool and melena.  Genitourinary: Negative.   Skin: Negative.   Neurological: Negative for dizziness, sensory change, loss of consciousness and headaches.  Psychiatric/Behavioral: Negative for depression. The patient is not nervous/anxious and does not have insomnia.     Family history- Review and unchanged  Social history- Review and unchanged  Physical Exam: BP 104/62 mmHg  Pulse 64  Temp(Src) 98.2 F (36.8 C) (Temporal)  Resp 16  Ht 5\' 3"  (1.6 m)  Wt 140 lb (63.504 kg)  BMI 24.81 kg/m2 Wt Readings from Last 3 Encounters:  06/10/15 140 lb (63.504 kg)  02/19/15 141 lb (63.957 kg)  12/01/14 142 lb 9.6 oz (64.683 kg)    General Appearance: Well nourished well developed, in no apparent distress. Eyes: PERRLA, EOMs, conjunctiva no swelling or erythema ENT/Mouth: Ear canals normal without obstruction, swelling, erythma, discharge.  TMs normal bilaterally.  Oropharynx moist, clear, without exudate, or postoropharyngeal swelling. Neck: Supple, thyroid normal,no cervical adenopathy  Respiratory: Respiratory effort normal, Breath sounds clear A&P without rhonchi, wheeze, or rale.  No retractions, no accessory usage. Cardio: RRR with no MRGs. Brisk peripheral pulses without edema.  Abdomen: Soft, + BS,  Non tender, no guarding, rebound, hernias, masses. Musculoskeletal: Full ROM, 5/5 strength, Normal gait Skin: Warm, dry without rashes, lesions, ecchymosis.  Neuro: Awake and oriented X 3, Cranial nerves intact. Normal muscle tone, no cerebellar symptoms. Psych: Normal affect, Insight and Judgment appropriate.    Starlyn Skeans, PA-C 10:55 AM [facility]

## 2015-06-10 NOTE — Patient Instructions (Signed)
GETTING OFF OF PPI's    Nexium/protonix/prilosec/Omeprazole/Dexilant/Aciphex are called PPI's, they are great at healing your stomach but should only be taken for a short period of time.     Recent studies have shown that taken for a long time they  can increase the risk of osteoporosis (weakening of your bones), pneumonia, low magnesium, restless legs, Cdiff (infection that causes diarrhea), DEMENTIA and most recently kidney damage / disease / insufficiency.     Due to this information we want to try to stop the PPI but if you try to stop it abruptly this can cause rebound acid and worsening symptoms.   So this is how we want you to get off the PPI:  - Start taking the nexium/protonix/prilosec/PPI  every other day with  zantac (ranitidine) 2 x a day for 2-4 weeks  - then decrease the PPI to every 3 days while taking the zantac (ranitidine) twice a day the other  days for 2-4  Weeks  - then you can try the zantac (ranitidine) once at night or up to 2 x day as needed.  - you can continue on this once at night or stop all together  - Avoid alcohol, spicy foods, NSAIDS (aleve, ibuprofen) at this time. See foods below.   +++++++++++++++++++++++++++++++++++++++++++  Food Choices for Gastroesophageal Reflux Disease  When you have gastroesophageal reflux disease (GERD), the foods you eat and your eating habits are very important. Choosing the right foods can help ease the discomfort of GERD. WHAT GENERAL GUIDELINES DO I NEED TO FOLLOW?  Choose fruits, vegetables, whole grains, low-fat dairy products, and low-fat meat, fish, and poultry.  Limit fats such as oils, salad dressings, butter, nuts, and avocado.  Keep a food diary to identify foods that cause symptoms.  Avoid foods that cause reflux. These may be different for different people.  Eat frequent small meals instead of three large meals each day.  Eat your meals slowly, in a relaxed setting.  Limit fried foods.  Cook foods  using methods other than frying.  Avoid drinking alcohol.  Avoid drinking large amounts of liquids with your meals.  Avoid bending over or lying down until 2-3 hours after eating.   WHAT FOODS ARE NOT RECOMMENDED? The following are some foods and drinks that may worsen your symptoms:  Vegetables Tomatoes. Tomato juice. Tomato and spaghetti sauce. Chili peppers. Onion and garlic. Horseradish. Fruits Oranges, grapefruit, and lemon (fruit and juice). Meats High-fat meats, fish, and poultry. This includes hot dogs, ribs, ham, sausage, salami, and bacon. Dairy Whole milk and chocolate milk. Sour cream. Cream. Butter. Ice cream. Cream cheese.  Beverages Coffee and tea, with or without caffeine. Carbonated beverages or energy drinks. Condiments Hot sauce. Barbecue sauce.  Sweets/Desserts Chocolate and cocoa. Donuts. Peppermint and spearmint. Fats and Oils High-fat foods, including French fries and potato chips. Other Vinegar. Strong spices, such as black pepper, white pepper, red pepper, cayenne, curry powder, cloves, ginger, and chili powder. Nexium/protonix/prilosec are called PPI's, they are great at healing your stomach but should only be taken for a short period of time.    

## 2015-06-11 LAB — HEMOGLOBIN A1C
HEMOGLOBIN A1C: 5.4 % (ref <5.7)
Mean Plasma Glucose: 108 mg/dL

## 2015-06-13 ENCOUNTER — Ambulatory Visit (INDEPENDENT_AMBULATORY_CARE_PROVIDER_SITE_OTHER): Payer: BLUE CROSS/BLUE SHIELD | Admitting: Physician Assistant

## 2015-06-13 VITALS — BP 122/72 | HR 106 | Temp 98.9°F | Resp 17 | Ht 63.5 in | Wt 138.0 lb

## 2015-06-13 DIAGNOSIS — R300 Dysuria: Secondary | ICD-10-CM

## 2015-06-13 DIAGNOSIS — R109 Unspecified abdominal pain: Secondary | ICD-10-CM | POA: Diagnosis not present

## 2015-06-13 DIAGNOSIS — R197 Diarrhea, unspecified: Secondary | ICD-10-CM

## 2015-06-13 LAB — POCT URINALYSIS DIP (MANUAL ENTRY)
Bilirubin, UA: NEGATIVE
Glucose, UA: NEGATIVE
Ketones, POC UA: NEGATIVE
Leukocytes, UA: NEGATIVE
NITRITE UA: NEGATIVE
RBC UA: NEGATIVE
SPEC GRAV UA: 1.015
UROBILINOGEN UA: 0.2
pH, UA: 6.5

## 2015-06-13 LAB — POCT CBC
Granulocyte percent: 86.8 %G — AB (ref 37–80)
HCT, POC: 39.5 % (ref 37.7–47.9)
Hemoglobin: 13.9 g/dL (ref 12.2–16.2)
LYMPH, POC: 0.5 — AB (ref 0.6–3.4)
MCH, POC: 29.6 pg (ref 27–31.2)
MCHC: 35.3 g/dL (ref 31.8–35.4)
MCV: 84 fL (ref 80–97)
MID (CBC): 0.2 (ref 0–0.9)
MPV: 7.7 fL (ref 0–99.8)
POC Granulocyte: 4.7 (ref 2–6.9)
POC LYMPH %: 9.9 % — AB (ref 10–50)
POC MID %: 3.3 % (ref 0–12)
Platelet Count, POC: 183 10*3/uL (ref 142–424)
RBC: 4.7 M/uL (ref 4.04–5.48)
RDW, POC: 12.3 %
WBC: 5.4 10*3/uL (ref 4.6–10.2)

## 2015-06-13 LAB — COMPREHENSIVE METABOLIC PANEL
ALBUMIN: 4.1 g/dL (ref 3.6–5.1)
ALK PHOS: 79 U/L (ref 33–115)
ALT: 85 U/L — AB (ref 6–29)
AST: 94 U/L — ABNORMAL HIGH (ref 10–30)
BUN: 10 mg/dL (ref 7–25)
CALCIUM: 8.6 mg/dL (ref 8.6–10.2)
CO2: 24 mmol/L (ref 20–31)
Chloride: 101 mmol/L (ref 98–110)
Creat: 0.56 mg/dL (ref 0.50–1.10)
Glucose, Bld: 87 mg/dL (ref 65–99)
POTASSIUM: 3.6 mmol/L (ref 3.5–5.3)
Sodium: 133 mmol/L — ABNORMAL LOW (ref 135–146)
TOTAL PROTEIN: 6.3 g/dL (ref 6.1–8.1)
Total Bilirubin: 0.4 mg/dL (ref 0.2–1.2)

## 2015-06-13 LAB — POC MICROSCOPIC URINALYSIS (UMFC): MUCUS RE: ABSENT

## 2015-06-13 NOTE — Progress Notes (Signed)
[facility] [address] [place] [address] [phone]  Date:  06/13/2015   Name:  [name]   DOB:  10/12/1970   MRN:  [id number]  PCP:  Alesia Richards, MD    Chief Complaint: dysuria, kidney stones   History of Present Illness:  This is a 45 y.o. female with PMH prediabetes, HLD, renal stones, IBS who is presenting with right sided back pain that radiates into flank, x 2 days.  States every position makes it hurt. Can't get comfortable. Temp last night 100. States she just doesn't feel good. Started having diarrhea 3 days ago. Had 6 episodes the first day, 6-8 episodes yesterday and 2 episodes so far today. No diarrhea in 6 hours at this point. No blood in stool. Has felt nauseated intermittently, no vomiting. Both kids with diarrhea/vomiting past few days but their symptoms have resolved at this point. No dysuria, urinary frequency, hematuria, vaginal discharge, abdominal pain. She has had this right sided back/flank pain 3 times in the past 1 year. First time was 04/2014. Normal UA at that time but was treated with cipro and symptoms resolved. 2nd time was 02/2015 -- UA and urine culture normal. Abdominal u/s showed:  1. 12 mm echogenic focus in the interpolar region of the right kidney which demonstrates some posterior acoustic shadowing, concerning for a nonobstructive calculus. No hydronephrosis to suggest urinary tract obstruction at this time. 2. No acute findings. 3. Status post cholecystectomy.  She was sent to urology who did a renal CT -- reportedly normal. Pt states she thinks she had passed the stone before seeing urology because her symptoms had resolved.  This is her 3rd episode.   Pt does have a hx of IBS, constipation type. She generally takes amitiza at night and miralax in the morning and this keeps her regular. She has not been taking the miralax the past few days since having diarrhea.  Pt had cholecystectomy 4-5 years ago. Had  partial hysterectomy in 2010.  Review of Systems:  Review of Systems See HPI  Patient Active Problem List   Diagnosis Date Noted  . Prediabetes 04/23/2014  . Medication management 04/23/2014  . Vitamin D deficiency 04/23/2014  . 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 Dyspepsia 06/13/2011  . Epigastric pain 06/13/2011  . Asthma 06/07/2011  . IBS (irritable bowel syndrome) 06/07/2011  . Hyperlipidemia 06/07/2011  . Renal stone 06/07/2011    Prior to Admission medications   Medication Sig Start Date End Date Taking? Authorizing Provider  AMITIZA 8 MCG capsule TAKE ONE CAPSULE BY MOUTH TWICE DAILY FOR  IBS 05/16/15  Yes Unk Pinto, MD  aspirin 81 MG tablet Take 81 mg by mouth See admin instructions. Takes 1 tablet on mondays, wednesdays, and fridays   Yes Historical Provider, MD  bisoprolol-hydrochlorothiazide (ZIAC) 2.5-6.25 MG per tablet Take 1 tablet by mouth daily. 12/01/14  Yes Courtney Forcucci, PA-C  cholecalciferol (VITAMIN D) 1000 UNITS tablet Take 2,000 Units by mouth. Takes 3 times per week   Yes Historical Provider, MD  fexofenadine (ALLEGRA) 180 MG tablet Take 180 mg by mouth. Takes QOD prn   Yes Historical Provider, MD  gemfibrozil (LOPID) 600 MG tablet TAKE ONE TABLET BY MOUTH TWICE DAILY FOR CHOLESTEROL 01/10/15  Yes Unk Pinto, MD  guaiFENesin (MUCINEX) 600 MG 12 hr tablet Take 600 mg by mouth 2 (two) times daily.    Yes Historical Provider, MD  Magnesium 400 MG CAPS Take 400 mg by mouth daily.  Yes Historical Provider, MD  meloxicam (MOBIC) 15 MG tablet TAKE ONE TABLET BY MOUTH ONCE DAILY AS NEEDED 11/20/14  Yes Unk Pinto, MD  minocycline (DYNACIN) 100 MG tablet Take 100 mg by mouth. Takes QOD   Yes Historical Provider, MD  omeprazole (PRILOSEC) 20 MG capsule Take 20 mg by mouth daily.   Yes Historical Provider, MD  polyethylene glycol (MIRALAX / GLYCOLAX) packet Take 17 g by mouth daily.   Yes Historical Provider, MD  pseudoephedrine (SUDAFED) 60 MG tablet  Take 60 mg by mouth every 4 (four) hours as needed for congestion.   Yes Historical Provider, MD  QVAR 80 MCG/ACT inhaler INHALE ONE TO TWO PUFFS TWICE DAILY 07/01/14  Yes Unk Pinto, MD  traMADol (ULTRAM) 50 MG tablet Take 1 tablet (50 mg total) by mouth every 6 (six) hours as needed. 02/19/15 02/19/16 Yes Courtney Forcucci, PA-C  vitamin E 400 UNIT capsule Take 400 Units by mouth 2 (two) times daily.   Yes Historical Provider, MD    Allergies  Allergen Reactions  . Food     strawberries    Past Surgical History  Procedure Laterality Date  . Ectopic pregnancy surgery  2005  . Partial hysterectomy  2009  . Esophagogastroduodenoscopy  06/13/2011    Procedure: ESOPHAGOGASTRODUODENOSCOPY (EGD);  Surgeon: Jerene Bears, MD;  Location: [facility];  Service: Gastroenterology;  Laterality: N/A;  . Cholecystectomy  07/27/2011    Procedure: LAPAROSCOPIC CHOLECYSTECTOMY WITH INTRAOPERATIVE CHOLANGIOGRAM;  Surgeon: Shann Medal, MD;  Location: [facility];  Service: General;  Laterality: N/A;    Social History  Substance Use Topics  . Smoking status: Never Smoker   . Smokeless tobacco: Never Used  . Alcohol Use: No    Family History  Problem Relation Age of Onset  . Breast cancer Mother   . Lymphoma Mother   . Migraines Mother   . Hyperlipidemia Mother   . Stroke Maternal Grandmother   . Diabetes type II Father   . Diabetes Father   . Migraines Sister   . Hyperlipidemia Sister     Medication list has been reviewed and updated.  Physical Examination:  Physical Exam  Constitutional: She is oriented to person, place, and time. She appears well-developed and well-nourished. No distress.  HENT:  Head: Normocephalic and atraumatic.  Right Ear: Hearing, tympanic membrane, external ear and ear canal normal.  Left Ear: Hearing, tympanic membrane, external ear and ear canal normal.  Nose: Nose normal.  Mouth/Throat: Uvula is midline, oropharynx is clear and moist and mucous membranes are  normal.  Eyes: Conjunctivae and lids are normal. Right eye exhibits no discharge. Left eye exhibits no discharge. No scleral icterus.  Cardiovascular: Normal rate, regular rhythm, normal heart sounds and normal pulses.   No murmur heard. Pulmonary/Chest: Effort normal and breath sounds normal. No respiratory distress. She has no wheezes. She has no rhonchi. She has no rales.  Abdominal: Soft. Normal appearance. There is no tenderness. There is no CVA tenderness.  Musculoskeletal: Normal range of motion.  Has tenderness over lateral inferior right ribs. No tenderness in back.  Lymphadenopathy:       Head (right side): No submental, no submandibular and no tonsillar adenopathy present.       Head (left side): No submental, no submandibular and no tonsillar adenopathy present.    She has no cervical adenopathy.  Neurological: She is alert and oriented to person, place, and time.  Skin: Skin is warm, dry and intact. No lesion and no rash  noted.  Psychiatric: She has a normal mood and affect. Her speech is normal and behavior is normal. Thought content normal.   BP 122/72 mmHg  Pulse 106  Temp(Src) 98.9 F (37.2 C) (Oral)  Resp 17  Ht 5' 3.5" (1.613 m)  Wt 138 lb (62.596 kg)  BMI 24.06 kg/m2  SpO2 98%  Results for orders placed or performed in visit on 06/13/15  POCT Microscopic Urinalysis ([facility])  Result Value Ref Range   WBC,UR,HPF,POC None None WBC/hpf   RBC,UR,HPF,POC None None RBC/hpf   Bacteria Few (A) None, Too numerous to count   Mucus Absent Absent   Epithelial Cells, UR Per Microscopy Few (A) None, Too numerous to count cells/hpf  POCT urinalysis dipstick  Result Value Ref Range   Color, UA yellow yellow   Clarity, UA clear clear   Glucose, UA negative negative   Bilirubin, UA negative negative   Ketones, POC UA negative negative   Spec Grav, UA 1.015    Blood, UA negative negative   pH, UA 6.5    Protein Ur, POC =30 (A) negative   Urobilinogen, UA 0.2    Nitrite,  UA Negative Negative   Leukocytes, UA Negative Negative   Results for orders placed or performed in visit on 06/13/15  POCT Microscopic Urinalysis ([facility])  Result Value Ref Range   WBC,UR,HPF,POC None None WBC/hpf   RBC,UR,HPF,POC None None RBC/hpf   Bacteria Few (A) None, Too numerous to count   Mucus Absent Absent   Epithelial Cells, UR Per Microscopy Few (A) None, Too numerous to count cells/hpf  POCT urinalysis dipstick  Result Value Ref Range   Color, UA yellow yellow   Clarity, UA clear clear   Glucose, UA negative negative   Bilirubin, UA negative negative   Ketones, POC UA negative negative   Spec Grav, UA 1.015    Blood, UA negative negative   pH, UA 6.5    Protein Ur, POC =30 (A) negative   Urobilinogen, UA 0.2    Nitrite, UA Negative Negative   Leukocytes, UA Negative Negative  POCT CBC  Result Value Ref Range   WBC 5.4 4.6 - 10.2 K/uL   Lymph, poc 0.5 (A) 0.6 - 3.4   POC LYMPH PERCENT 9.9 (A) 10 - 50 %L   MID (cbc) 0.2 0 - 0.9   POC MID % 3.3 0 - 12 %M   POC Granulocyte 4.7 2 - 6.9   Granulocyte percent 86.8 (A) 37 - 80 %G   RBC 4.70 4.04 - 5.48 M/uL   Hemoglobin 13.9 12.2 - 16.2 g/dL   HCT, POC 39.5 37.7 - 47.9 %   MCV 84.0 80 - 97 fL   MCH, POC 29.6 27 - 31.2 pg   MCHC 35.3 31.8 - 35.4 g/dL   RDW, POC 12.3 %   Platelet Count, POC 183 142 - 424 K/uL   MPV 7.7 0 - 99.8 fL   Assessment and Plan:  1. Right flank pain 2. Diarrhea  UA unremarkable. CBC with wbc wnl, mild left shifting. CMP pending. Pt will return with stool specimen. Pts symptoms with unknown etiology. Diff dx includes -- MSK (pt tender over right lateral ribs), gastroenteritis, IBS, and poss kidney stone (less likely with normal UA and normal CT renal 1-2 months ago). She will take ibuprofen/tylenol for pain/inflammation. Focus on hydration. Return with stool sample. Return if symptoms worsen, otherwise f/u with PCP. - POCT Microscopic Urinalysis ([facility]) - POCT urinalysis dipstick - POCT  CBC -  Comprehensive metabolic panel - Ova and parasite examination - Stool culture - Clostridium Difficile by PCR   Benjaman Pott. Drenda Freeze, MHS [facility]  06/13/2015

## 2015-06-13 NOTE — Patient Instructions (Addendum)
Continue heating pad and ibuprofen/tylenol for pain Drink plenty of fluids >64 oz water a day. Return with stool specimen If your symptoms are not improving in 1 week or if at any time your symptoms worsen, return to clinic.     IF you received an x-ray today, you will receive an invoice from [facility]. Please contact [facility] at [phone] with questions or concerns regarding your invoice.   IF you received labwork today, you will receive an invoice from [employer]. Please contact [facility] at [phone] with questions or concerns regarding your invoice.   Our billing staff will not be able to assist you with questions regarding bills from these companies.  You will be contacted with the lab results as soon as they are available. The fastest way to get your results is to activate your My Chart account. Instructions are located on the last page of this paperwork. If you have not heard from [place] regarding the results in 2 weeks, please contact this office.

## 2015-06-15 LAB — CLOSTRIDIUM DIFFICILE BY PCR: CDIFFPCR: NOT DETECTED

## 2015-06-15 LAB — OVA AND PARASITE EXAMINATION: OP: NONE SEEN

## 2015-06-18 ENCOUNTER — Telehealth: Payer: Self-pay | Admitting: Family Medicine

## 2015-06-18 NOTE — Telephone Encounter (Signed)
[facility] called regarding positive stool cx. Positive Salmonella

## 2015-06-18 NOTE — Telephone Encounter (Signed)
Talked with pt, let her know stool clx was positive for salmonella. She states that her symptoms are getting much better. Only having diarrhea 3x a day now. Stool color has changed from yellow to brown. She has not noticed any blood in her stool. No fever, chills. Tolerating oral fluids well. She has been focusing on hand hygiene. Explained salmonella is a self limiting infection and should get better on own. If diarrhea increases >10 times a day, if she develops fever or blood in stool, or becomes unable to tolerate oral fluids she will let me know and I will prescribe abx. Call in a week if not improving.

## 2015-06-29 ENCOUNTER — Telehealth: Payer: Self-pay

## 2015-06-29 NOTE — Telephone Encounter (Signed)
Tammy from [facility] is calling because a request for records was sent on 4/12 and she would like to know if it was received or not. Please call! [phone]

## 2015-07-19 LAB — STOOL CULTURE

## 2015-09-30 ENCOUNTER — Other Ambulatory Visit: Payer: Self-pay | Admitting: Obstetrics and Gynecology

## 2015-09-30 DIAGNOSIS — R928 Other abnormal and inconclusive findings on diagnostic imaging of breast: Secondary | ICD-10-CM

## 2015-10-04 ENCOUNTER — Other Ambulatory Visit: Payer: Self-pay | Admitting: Internal Medicine

## 2015-10-04 ENCOUNTER — Encounter: Payer: Self-pay | Admitting: Internal Medicine

## 2015-10-04 MED ORDER — TRIAMCINOLONE ACETONIDE 0.1 % EX CREA: 1.0000 "application " | TOPICAL_CREAM | Freq: Two times a day (BID) | CUTANEOUS | 0 refills | Status: DC

## 2015-10-10 ENCOUNTER — Other Ambulatory Visit: Payer: Self-pay | Admitting: Internal Medicine

## 2015-10-12 ENCOUNTER — Ambulatory Visit
Admission: RE | Admit: 2015-10-12 | Discharge: 2015-10-12 | Disposition: A | Discharge status: Home / Self Care | Payer: BLUE CROSS/BLUE SHIELD | Source: Ambulatory Visit | Attending: Obstetrics and Gynecology | Admitting: Obstetrics and Gynecology

## 2015-10-12 DIAGNOSIS — R928 Other abnormal and inconclusive findings on diagnostic imaging of breast: Secondary | ICD-10-CM

## 2015-11-20 ENCOUNTER — Other Ambulatory Visit: Payer: Self-pay | Admitting: Internal Medicine

## 2015-11-22 ENCOUNTER — Other Ambulatory Visit: Payer: Self-pay | Admitting: *Deleted

## 2015-11-22 MED ORDER — MELOXICAM 15 MG PO TABS: ORAL_TABLET | ORAL | 3 refills | Status: DC

## 2015-12-01 ENCOUNTER — Encounter: Payer: Self-pay | Admitting: Internal Medicine

## 2015-12-16 ENCOUNTER — Ambulatory Visit (INDEPENDENT_AMBULATORY_CARE_PROVIDER_SITE_OTHER): Payer: BLUE CROSS/BLUE SHIELD | Admitting: Internal Medicine

## 2015-12-16 ENCOUNTER — Encounter: Payer: Self-pay | Admitting: Internal Medicine

## 2015-12-16 VITALS — BP 124/70 | HR 56 | Temp 98.2°F | Resp 16 | Ht 63.0 in | Wt 140.0 lb

## 2015-12-16 DIAGNOSIS — Z136 Encounter for screening for cardiovascular disorders: Secondary | ICD-10-CM

## 2015-12-16 DIAGNOSIS — Z0000 Encounter for general adult medical examination without abnormal findings: Secondary | ICD-10-CM | POA: Diagnosis not present

## 2015-12-16 DIAGNOSIS — L700 Acne vulgaris: Secondary | ICD-10-CM

## 2015-12-16 DIAGNOSIS — Z79899 Other long term (current) drug therapy: Secondary | ICD-10-CM

## 2015-12-16 DIAGNOSIS — E559 Vitamin D deficiency, unspecified: Secondary | ICD-10-CM | POA: Diagnosis not present

## 2015-12-16 DIAGNOSIS — Z131 Encounter for screening for diabetes mellitus: Secondary | ICD-10-CM

## 2015-12-16 DIAGNOSIS — R0989 Other specified symptoms and signs involving the circulatory and respiratory systems: Secondary | ICD-10-CM

## 2015-12-16 DIAGNOSIS — I10 Essential (primary) hypertension: Secondary | ICD-10-CM | POA: Diagnosis not present

## 2015-12-16 DIAGNOSIS — Z130 Encounter for screening for diseases of the blood and blood-forming organs and certain disorders involving the immune mechanism: Secondary | ICD-10-CM

## 2015-12-16 DIAGNOSIS — Z0001 Encounter for general adult medical examination with abnormal findings: Secondary | ICD-10-CM

## 2015-12-16 DIAGNOSIS — K5909 Other constipation: Secondary | ICD-10-CM

## 2015-12-16 DIAGNOSIS — E782 Mixed hyperlipidemia: Secondary | ICD-10-CM

## 2015-12-16 LAB — BASIC METABOLIC PANEL WITH GFR
BUN: 16 mg/dL (ref 7–25)
CHLORIDE: 102 mmol/L (ref 98–110)
CO2: 23 mmol/L (ref 20–31)
CREATININE: 0.71 mg/dL (ref 0.50–1.10)
Calcium: 9.4 mg/dL (ref 8.6–10.2)
GFR, Est African American: >89 mL/min (ref >=60)
GFR, Est Non African American: >89 mL/min (ref >=60)
GLUCOSE: 75 mg/dL (ref 65–99)
POTASSIUM: 4 mmol/L (ref 3.5–5.3)
Sodium: 139 mmol/L (ref 135–146)

## 2015-12-16 LAB — LIPID PANEL
Cholesterol: 225 mg/dL — ABNORMAL HIGH (ref 125–200)
HDL: 51 mg/dL (ref >=46)
LDL CALC: 157 mg/dL — AB (ref <130)
Total CHOL/HDL Ratio: 4.4 Ratio (ref <=5.0)
Triglycerides: 87 mg/dL (ref <150)
VLDL: 17 mg/dL (ref <30)

## 2015-12-16 LAB — CBC WITH DIFFERENTIAL/PLATELET
BASOS PCT: 1 %
Basophils Absolute: 63 cells/uL (ref 0–200)
EOS ABS: 126 {cells}/uL (ref 15–500)
Eosinophils Relative: 2 %
HEMATOCRIT: 43.6 % (ref 35.0–45.0)
Hemoglobin: 14.6 g/dL (ref 11.7–15.5)
LYMPHS ABS: 1953 {cells}/uL (ref 850–3900)
Lymphocytes Relative: 31 %
MCH: 28.2 pg (ref 27.0–33.0)
MCHC: 33.5 g/dL (ref 32.0–36.0)
MCV: 84.3 fL (ref 80.0–100.0)
MONO ABS: 441 {cells}/uL (ref 200–950)
MONOS PCT: 7 %
MPV: 9.3 fL (ref 7.5–12.5)
NEUTROS ABS: 3717 {cells}/uL (ref 1500–7800)
Neutrophils Relative %: 59 %
PLATELETS: 292 10*3/uL (ref 140–400)
RBC: 5.17 MIL/uL — ABNORMAL HIGH (ref 3.80–5.10)
RDW: 13.1 % (ref 11.0–15.0)
WBC: 6.3 10*3/uL (ref 3.8–10.8)

## 2015-12-16 LAB — HEPATIC FUNCTION PANEL
ALBUMIN: 4.8 g/dL (ref 3.6–5.1)
ALK PHOS: 65 U/L (ref 33–115)
ALT: 10 U/L (ref 6–29)
AST: 15 U/L (ref 10–30)
BILIRUBIN TOTAL: 0.8 mg/dL (ref 0.2–1.2)
Bilirubin, Direct: 0.1 mg/dL (ref <=0.2)
Indirect Bilirubin: 0.7 mg/dL (ref 0.2–1.2)
TOTAL PROTEIN: 6.8 g/dL (ref 6.1–8.1)

## 2015-12-16 LAB — IRON AND TIBC
%SAT: 37 % (ref 11–50)
IRON: 149 ug/dL (ref 40–190)
TIBC: 403 ug/dL (ref 250–450)
UIBC: 254 ug/dL (ref 125–400)

## 2015-12-16 LAB — TSH: TSH: 1.27 m[IU]/L

## 2015-12-16 LAB — VITAMIN B12: VITAMIN B 12: 440 pg/mL (ref 200–1100)

## 2015-12-16 LAB — MAGNESIUM: MAGNESIUM: 2.2 mg/dL (ref 1.5–2.5)

## 2015-12-16 MED ORDER — TRETINOIN 0.01 % EX GEL: Freq: Every day | CUTANEOUS | 0 refills | Status: DC

## 2015-12-16 NOTE — Progress Notes (Signed)
Complete Physical  Assessment and Plan:  1. Encounter for general adult medical examination with abnormal findings  - CBC with Differential/Platelet - BASIC METABOLIC PANEL WITH GFR - Hepatic function panel - Magnesium  2. Mixed hyperlipidemia -cont lopid - Lipid panel  3. Screening for diabetes mellitus  - Hemoglobin A1c - Insulin, random  4. Screening for deficiency anemia  - Vitamin B12 - Iron and TIBC  5. Labile hypertension -stop ziac once prescription runs out -if well controlled off medication will stay off ziac - Urinalysis, Routine w reflex microscopic (not at [facility]) - Microalbumin / creatinine urine ratio - EKG 12-Lead - TSH  6. Vitamin D deficiency  - VITAMIN D 25 Hydroxy (Vit-D Deficiency, Fractures)  7.  Chronic constipation -stop amitiza -try 72 mcg linzess samples -if works will change to linzess  8.  Cystic acne -stop minocycline -aczone -retin-a  Discussed med's effects and SE's. Screening labs and tests as requested with regular follow-up as recommended.  HPI  45 y.o. female  presents for a complete physical.  Her blood pressure has been controlled at home, today their BP is BP: 124/70.  She does not workout. She denies chest pain, shortness of breath, dizziness. She is still running for exercise and she does it 2-3 times per week.  She reports that she does change out her shoes.    She is on cholesterol medication and denies myalgias. Her cholesterol is at goal. The cholesterol last visit was:  Lab Results  Component Value Date   CHOL 194 06/10/2015   HDL 41 (L) 06/10/2015   LDLCALC 131 (H) 06/10/2015   TRIG 112 06/10/2015   CHOLHDL 4.7 06/10/2015  .  She has been working on diet and exercise for prediabetes, she is on bASA, she is not on ACE/ARB and denies foot ulcerations, hyperglycemia, hypoglycemia , increased appetite, nausea, paresthesia of the feet, polydipsia, polyuria, visual disturbances, vomiting and weight loss. Last A1C in  the office was:  Lab Results  Component Value Date   HGBA1C 5.4 06/10/2015    Patient is on Vitamin D supplement.   Lab Results  Component Value Date   VD25OH 68 12/01/2014     She reports that she is still using minocycline once per week.  She reports that she does occasionally get cystic acne lesions.  She would like to get off an oral pill daily to help with this.  She reports that she is still having to take miralax on top of the amitza.  She did have diarrhea on the 145 of linzess but never tried the 72 mcg.  She has no family history of colon cancer.  She does have a first degree relative with breast cancer.    Current Medications:  Current Outpatient Prescriptions on File Prior to Visit  Medication Sig Dispense Refill  . AMITIZA 8 MCG capsule TAKE ONE CAPSULE BY MOUTH TWICE DAILY FOR  IBS 60 capsule 5  . aspirin 81 MG tablet Take 81 mg by mouth See admin instructions. Takes 1 tablet on mondays, wednesdays, and fridays    . bisoprolol-hydrochlorothiazide (ZIAC) 2.5-6.25 MG tablet TAKE ONE TABLET BY MOUTH ONCE DAILY 90 tablet 1  . cholecalciferol (VITAMIN D) 1000 UNITS tablet Take 2,000 Units by mouth. Takes 3 times per week    . gemfibrozil (LOPID) 600 MG tablet TAKE ONE TABLET BY MOUTH TWICE DAILY FOR CHOLESTEROL 180 tablet 3  . guaiFENesin (MUCINEX) 600 MG 12 hr tablet Take 600 mg by mouth 2 (two) times daily.     Marland Kitchen  Magnesium 400 MG CAPS Take 400 mg by mouth daily.    . meloxicam (MOBIC) 15 MG tablet TAKE ONE TABLET BY MOUTH ONCE DAILY AS NEEDED 90 tablet 3  . minocycline (DYNACIN) 100 MG tablet Take 100 mg by mouth. Takes QOD    . polyethylene glycol (MIRALAX / GLYCOLAX) packet Take 17 g by mouth daily.    . pseudoephedrine (SUDAFED) 60 MG tablet Take 60 mg by mouth every 4 (four) hours as needed for congestion.    Marland Kitchen QVAR 80 MCG/ACT inhaler INHALE ONE TO TWO PUFFS TWICE DAILY 9 g 99  . vitamin E 400 UNIT capsule Take 400 Units by mouth 2 (two) times daily.     No current  facility-administered medications on file prior to visit.     Health Maintenance:   Immunization History  Administered Date(s) Administered  . Hepatitis B 09/08/2015, 10/08/2015  . Influenza-Unspecified 12/26/2013, 11/26/2014  . PPD Test 11/25/2013  . Tdap 09/17/2012    Tetanus: 2014 Pneumovax:  Not indicated Flu vaccine: Due at work LMP: Hysterctomy MGM: 10/12/15 Colonoscopy: wait until 50 EGD: 2013 Last Eye Exam:  Due in January, wears reading glasses  Patient Care Team: Unk Pinto, MD as PCP - General (Internal Medicine) Jerene Bears, MD as Referring Physician (Gastroenterology) Jimmey Ralph, NP as Nurse Practitioner (Urology) Everlene Farrier, MD as Consulting Physician (Obstetrics and Gynecology) Druscilla Brownie, MD as Consulting Physician (Dermatology) Inocencio Homes, DPM as Consulting Physician (Podiatry)  Allergies:  Allergies  Allergen Reactions  . Food     strawberries    Medical History:  Past Medical History:  Diagnosis Date  . Acne   . Arthritis   . Asthma   . Chronic kidney disease   . Difficulty sleeping    due to discomfort  . Elevated cholesterol   . GERD (gastroesophageal reflux disease)   . HLD (hyperlipidemia)   . IBS (irritable bowel syndrome)   . Nausea     Surgical History:  Past Surgical History:  Procedure Laterality Date  . CHOLECYSTECTOMY  07/27/2011   Procedure: LAPAROSCOPIC CHOLECYSTECTOMY WITH INTRAOPERATIVE CHOLANGIOGRAM;  Surgeon: Shann Medal, MD;  Location: [facility];  Service: General;  Laterality: N/A;  . ECTOPIC PREGNANCY SURGERY  2005  . ESOPHAGOGASTRODUODENOSCOPY  06/13/2011   Procedure: ESOPHAGOGASTRODUODENOSCOPY (EGD);  Surgeon: Jerene Bears, MD;  Location: [facility];  Service: Gastroenterology;  Laterality: N/A;  . PARTIAL HYSTERECTOMY  2009    Family History:  Family History  Problem Relation Age of Onset  . Breast cancer Mother   . Lymphoma Mother   . Migraines Mother   . Hyperlipidemia Mother   . Stroke  Maternal Grandmother   . Diabetes type II Father   . Diabetes Father   . Migraines Sister   . Hyperlipidemia Sister     Social History:  Social History  Substance Use Topics  . Smoking status: Never Smoker  . Smokeless tobacco: Never Used  . Alcohol use No    Review of Systems: Review of Systems  Constitutional: Negative for chills, fever and malaise/fatigue.  HENT: Negative for congestion, ear pain and sore throat.   Eyes: Negative.   Respiratory: Negative for cough, shortness of breath and wheezing.   Cardiovascular: Negative for chest pain, palpitations and leg swelling.  Gastrointestinal: Negative for abdominal pain, blood in stool, constipation, diarrhea, heartburn and melena.  Genitourinary: Negative.   Skin: Negative.   Neurological: Negative for dizziness, sensory change, loss of consciousness and headaches.  Psychiatric/Behavioral: Negative for depression. The  patient is not nervous/anxious and does not have insomnia.     Physical Exam: Estimated body mass index is 24.8 kg/m as calculated from the following:   Height as of this encounter: 5\' 3"  (1.6 m).   Weight as of this encounter: 140 lb (63.5 kg). BP 124/70   Pulse (!) 56   Temp 98.2 F (36.8 C) (Temporal)   Resp 16   Ht 5\' 3"  (1.6 m)   Wt 140 lb (63.5 kg)   BMI 24.80 kg/m   General Appearance: Well nourished well developed, in no apparent distress.  Eyes: PERRLA, EOMs, conjunctiva no swelling or erythema ENT/Mouth: Ear canals normal without obstruction, swelling, erythema, or discharge.  TMs normal bilaterally with no erythema, bulging, retraction, or loss of landmark.  Oropharynx moist and clear with no exudate, erythema, or swelling.   Neck: Supple, thyroid normal. No bruits.  No cervical adenopathy Respiratory: Respiratory effort normal, Breath sounds clear A&P without wheeze, rhonchi, rales.   Cardio: RRR without murmurs, rubs or gallops. Brisk peripheral pulses without edema.  Chest: symmetric,  with normal excursions Abdomen: Soft, nontender, no guarding, rebound, hernias, masses, or organomegaly.  Lymphatics: Non tender without lymphadenopathy.  Musculoskeletal: Full ROM all peripheral extremities,5/5 strength, and normal gait.  Skin: Warm, dry without rashes, lesions, ecchymosis. Neuro: Awake and oriented X 3, Cranial nerves intact, reflexes equal bilaterally. Normal muscle tone, no cerebellar symptoms. Sensation intact.  Psych:  normal affect, Insight and Judgment appropriate.   EKG: WNL no changes.   Over 40 minutes of exam, counseling, chart review and critical decision making was performed  Starlyn Skeans 10:32 AM [facility]

## 2015-12-17 LAB — HEMOGLOBIN A1C
HEMOGLOBIN A1C: 5.2 % (ref <5.7)
MEAN PLASMA GLUCOSE: 103 mg/dL

## 2015-12-17 LAB — URINALYSIS, ROUTINE W REFLEX MICROSCOPIC
BILIRUBIN URINE: NEGATIVE
GLUCOSE, UA: NEGATIVE
Hgb urine dipstick: NEGATIVE
Ketones, ur: NEGATIVE
Leukocytes, UA: NEGATIVE
Nitrite: NEGATIVE
PH: 7 (ref 5.0–8.0)
PROTEIN: NEGATIVE
Specific Gravity, Urine: 1.006 (ref 1.001–1.035)

## 2015-12-17 LAB — MICROALBUMIN / CREATININE URINE RATIO
Creatinine, Urine: 25 mg/dL (ref 20–320)
Microalb, Ur: <0.2 mg/dL

## 2015-12-17 LAB — VITAMIN D 25 HYDROXY (VIT D DEFICIENCY, FRACTURES): VIT D 25 HYDROXY: 41 ng/mL (ref 30–100)

## 2015-12-17 LAB — INSULIN, RANDOM: INSULIN: 2.3 u[IU]/mL (ref 2.0–19.6)

## 2015-12-21 ENCOUNTER — Other Ambulatory Visit: Payer: Self-pay | Admitting: Internal Medicine

## 2015-12-21 ENCOUNTER — Encounter: Payer: Self-pay | Admitting: Internal Medicine

## 2015-12-21 MED ORDER — LINACLOTIDE 72 MCG PO CAPS: 72.0000 ug | ORAL_CAPSULE | Freq: Every day | ORAL | 3 refills | Status: DC

## 2015-12-23 ENCOUNTER — Encounter: Payer: Self-pay | Admitting: Internal Medicine

## 2016-02-15 ENCOUNTER — Encounter: Payer: Self-pay | Admitting: Internal Medicine

## 2016-02-15 ENCOUNTER — Ambulatory Visit (INDEPENDENT_AMBULATORY_CARE_PROVIDER_SITE_OTHER): Payer: BLUE CROSS/BLUE SHIELD | Admitting: Internal Medicine

## 2016-02-15 ENCOUNTER — Other Ambulatory Visit: Payer: Self-pay | Admitting: Internal Medicine

## 2016-02-15 VITALS — BP 104/70 | HR 78 | Temp 98.2°F | Resp 18 | Ht 63.0 in

## 2016-02-15 DIAGNOSIS — J4521 Mild intermittent asthma with (acute) exacerbation: Secondary | ICD-10-CM

## 2016-02-15 DIAGNOSIS — J069 Acute upper respiratory infection, unspecified: Secondary | ICD-10-CM

## 2016-02-15 MED ORDER — DEXAMETHASONE SODIUM PHOSPHATE 10 MG/ML IJ SOLN
10.0000 mg | Freq: Once | INTRAMUSCULAR | Status: AC
  Administered 2016-02-15: 10 mg via INTRAMUSCULAR

## 2016-02-15 MED ORDER — AZITHROMYCIN 250 MG PO TABS: ORAL_TABLET | ORAL | 0 refills | Status: DC

## 2016-02-15 MED ORDER — SPACER/AERO-HOLDING CHAMBERS DEVI: 99 refills | Status: DC

## 2016-02-15 MED ORDER — PROMETHAZINE-PHENYLEPH-CODEINE 6.25-5-10 MG/5ML PO SYRP: 5.0000 mL | ORAL_SOLUTION | Freq: Four times a day (QID) | ORAL | 0 refills | Status: DC

## 2016-02-15 MED ORDER — PREDNISONE 20 MG PO TABS: ORAL_TABLET | ORAL | 0 refills | Status: DC

## 2016-02-15 MED ORDER — BENZONATATE 200 MG PO CAPS: 200.0000 mg | ORAL_CAPSULE | Freq: Three times a day (TID) | ORAL | 0 refills | Status: DC | PRN

## 2016-02-15 MED ORDER — ALBUTEROL SULFATE HFA 108 (90 BASE) MCG/ACT IN AERS: 2.0000 | INHALATION_SPRAY | RESPIRATORY_TRACT | 0 refills | Status: DC | PRN

## 2016-02-15 NOTE — Progress Notes (Signed)
HPI  Patient presents to the office for evaluation of cough.  It has been going on for 6 days.  Patient reports all the time, dry, barky, worse with lying down.  They also endorse change in voice, chills, fever, postnasal drip and ear fullness, sore scratchy throat, hoarseness.  .  They have tried mucinex, nasacort, qvar BID, xyzal, 1/2 sudafed in the monrings.  They report that nothing has worked.  They admits to other sick contacts.  She works as a Software engineer.    Review of Systems  Constitutional: Positive for malaise/fatigue. Negative for chills and fever.  HENT: Positive for congestion, ear pain and hearing loss. Negative for sinus pain and sore throat.   Respiratory: Positive for cough. Negative for sputum production, shortness of breath, wheezing and stridor.   Cardiovascular: Negative for chest pain, palpitations and leg swelling.    PE:  Vitals:   02/15/16 0955  BP: 104/70  Pulse: 78  Resp: 18  Temp: 98.2 F (36.8 C)    General:  Alert and non-toxic, WDWN, NAD HEENT: NCAT, PERLA, EOM normal, no occular discharge or erythema.  Nasal mucosal edema with sinus tenderness to palpation.  Oropharynx clear with minimal oropharyngeal edema and erythema.  Mucous membranes moist and pink. Neck:  Cervical adenopathy Chest:  RRR no MRGs.  Lungs clear to auscultation A&P with no wheezes rhonchi or rales.   Abdomen: +BS x 4 quadrants, soft, non-tender, no guarding, rigidity, or rebound. Skin: warm and dry no rash Neuro: A&Ox4, CN II-XII grossly intact  Assessment and Plan:   1. Acute URI -likely viral -hold zpak for several days - albuterol (PROVENTIL HFA;VENTOLIN HFA) 108 (90 Base) MCG/ACT inhaler; Inhale 2 puffs into the lungs every 2 (two) hours as needed for wheezing or shortness of breath (cough).  Dispense: 1 Inhaler; Refill: 0 - azithromycin (ZITHROMAX Z-PAK) 250 MG tablet; 2 po day one, then 1 daily x 4 days  Dispense: 6 tablet; Refill: 0 - Promethazine-Phenyleph-Codeine  6.25-5-10 MG/5ML SYRP; Take 5 mLs by mouth 4 (four) times daily.  Dispense: 240 mL; Refill: 0 - benzonatate (TESSALON) 200 MG capsule; Take 1 capsule (200 mg total) by mouth 3 (three) times daily as needed for cough.  Dispense: 60 capsule; Refill: 0 - predniSONE (DELTASONE) 20 MG tablet; 3 tabs po daily x 3 days, then 2 tabs x 3 days, then 1.5 tabs x 3 days, then 1 tab x 3 days, then 0.5 tabs x 3 days  Dispense: 27 tablet; Refill: 0 - dexamethasone (DECADRON) injection 10 mg; Inject 1 mL (10 mg total) into the muscle once.

## 2016-02-21 ENCOUNTER — Encounter: Payer: Self-pay | Admitting: Internal Medicine

## 2016-02-21 ENCOUNTER — Other Ambulatory Visit: Payer: Self-pay | Admitting: Internal Medicine

## 2016-02-21 MED ORDER — PROMETHAZINE-PHENYLEPHRINE 6.25-5 MG/5ML PO SYRP: 7.5000 mL | ORAL_SOLUTION | ORAL | 0 refills | Status: DC | PRN

## 2016-02-22 ENCOUNTER — Other Ambulatory Visit: Payer: Self-pay | Admitting: *Deleted

## 2016-02-22 DIAGNOSIS — J45909 Unspecified asthma, uncomplicated: Secondary | ICD-10-CM

## 2016-02-22 MED ORDER — BECLOMETHASONE DIPROPIONATE 80 MCG/ACT IN AERS: INHALATION_SPRAY | RESPIRATORY_TRACT | 3 refills | Status: DC

## 2016-02-24 ENCOUNTER — Encounter: Payer: Self-pay | Admitting: Internal Medicine

## 2016-02-24 ENCOUNTER — Ambulatory Visit (INDEPENDENT_AMBULATORY_CARE_PROVIDER_SITE_OTHER): Payer: BLUE CROSS/BLUE SHIELD | Admitting: Internal Medicine

## 2016-02-24 VITALS — BP 118/70 | HR 78 | Temp 98.2°F | Resp 18 | Ht 63.0 in

## 2016-02-24 DIAGNOSIS — J209 Acute bronchitis, unspecified: Secondary | ICD-10-CM | POA: Diagnosis not present

## 2016-02-24 MED ORDER — AZELASTINE HCL 0.1 % NA SOLN: 2.0000 | Freq: Two times a day (BID) | NASAL | 2 refills | Status: DC

## 2016-02-24 MED ORDER — DOXYCYCLINE HYCLATE 100 MG PO CAPS: 100.0000 mg | ORAL_CAPSULE | Freq: Two times a day (BID) | ORAL | 0 refills | Status: DC

## 2016-02-24 NOTE — Progress Notes (Signed)
HPI  Patient presents to the office for evaluation of cough.  It has been going on for 3 weeks.  Patient reports night > day, dry, barky, worse with lying down.  They also endorse chest pain, postnasal drip, shortness of breath, wheezing and nasal congestion, rhinorrhea, sinus pressure, and ear pain. .  They have tried antitussives, antibiotics, antihistamines, upright position, hydration or inhalers: qvar and albuterol.  They report that nothing has worked.  They admits to other sick contacts.  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  She notes that the phenergan DM has helped.  She reports that she is concerned that she has walking pneumonia.  She is requesting more antibiotics.    Review of Systems  Constitutional: Positive for chills and fever. Negative for malaise/fatigue.  HENT: Positive for congestion, ear pain, hearing loss and sore throat.   Respiratory: Positive for cough, sputum production, shortness of breath and wheezing.   Cardiovascular: Positive for chest pain. Negative for palpitations and leg swelling.    PE:  Vitals:   02/24/16 1048  BP: 118/70  Pulse: 78  Resp: 18  Temp: 98.2 F (36.8 C)   General:  Alert and non-toxic, WDWN, NAD HEENT: NCAT, PERLA, EOM normal, no occular discharge or erythema.  Nasal mucosal edema with sinus tenderness to palpation.  Oropharynx clear with minimal oropharyngeal edema and erythema.  Mucous membranes moist and pink. Neck:  Cervical adenopathy Chest:  RRR no MRGs.  Lungs clear to auscultation A&P with no wheezes rhonchi or rales.   Abdomen: +BS x 4 quadrants, soft, non-tender, no guarding, rigidity, or rebound. Skin: warm and dry no rash Neuro: A&Ox4, CN II-XII grossly intact  Assessment and Plan:   1. Acute bronchitis, unspecified organism -cont phenergan dm -breo in place of qvar for long acting bronchodilation -cont albuterol q6hrs -cont nasal sprays -start astelin -cont daily antihistamine -if  still no improvement needs CXR -doubt bacterial infection

## 2016-02-29 ENCOUNTER — Encounter: Payer: Self-pay | Admitting: Internal Medicine

## 2016-02-29 ENCOUNTER — Other Ambulatory Visit: Payer: Self-pay | Admitting: *Deleted

## 2016-02-29 ENCOUNTER — Encounter: Payer: Self-pay | Admitting: *Deleted

## 2016-02-29 MED ORDER — FLUTICASONE FUROATE-VILANTEROL 100-25 MCG/INH IN AEPB: 1.0000 | INHALATION_SPRAY | Freq: Every day | RESPIRATORY_TRACT | 3 refills | Status: DC

## 2016-03-16 ENCOUNTER — Encounter: Payer: Self-pay | Admitting: Internal Medicine

## 2016-03-16 ENCOUNTER — Other Ambulatory Visit: Payer: Self-pay | Admitting: Internal Medicine

## 2016-03-16 MED ORDER — FLUTICASONE FUROATE-VILANTEROL 200-25 MCG/INH IN AEPB: 1.0000 | INHALATION_SPRAY | Freq: Every day | RESPIRATORY_TRACT | 1 refills | Status: DC

## 2016-03-20 ENCOUNTER — Other Ambulatory Visit: Payer: Self-pay | Admitting: Internal Medicine

## 2016-04-25 ENCOUNTER — Other Ambulatory Visit: Payer: Self-pay | Admitting: Internal Medicine

## 2016-04-25 ENCOUNTER — Encounter: Payer: Self-pay | Admitting: Internal Medicine

## 2016-04-25 MED ORDER — OLOPATADINE HCL 0.2 % OP SOLN: OPHTHALMIC | 3 refills | Status: DC

## 2016-05-08 ENCOUNTER — Other Ambulatory Visit: Payer: Self-pay | Admitting: Internal Medicine

## 2016-05-29 ENCOUNTER — Other Ambulatory Visit: Payer: Self-pay | Admitting: Internal Medicine

## 2016-05-29 ENCOUNTER — Encounter: Payer: Self-pay | Admitting: Internal Medicine

## 2016-05-29 MED ORDER — ERYTHROMYCIN 5 MG/GM OP OINT: TOPICAL_OINTMENT | Freq: Three times a day (TID) | OPHTHALMIC | 0 refills | Status: AC

## 2016-07-06 ENCOUNTER — Other Ambulatory Visit: Payer: Self-pay | Admitting: Internal Medicine

## 2016-07-26 ENCOUNTER — Encounter: Payer: Self-pay | Admitting: *Deleted

## 2016-09-02 ENCOUNTER — Encounter: Payer: Self-pay | Admitting: Family Medicine

## 2016-09-02 ENCOUNTER — Ambulatory Visit (INDEPENDENT_AMBULATORY_CARE_PROVIDER_SITE_OTHER): Payer: BLUE CROSS/BLUE SHIELD | Admitting: Family Medicine

## 2016-09-02 ENCOUNTER — Ambulatory Visit (INDEPENDENT_AMBULATORY_CARE_PROVIDER_SITE_OTHER): Payer: BLUE CROSS/BLUE SHIELD

## 2016-09-02 VITALS — BP 118/72 | HR 70 | Temp 98.8°F | Resp 16 | Ht 63.0 in | Wt 137.6 lb

## 2016-09-02 DIAGNOSIS — R0781 Pleurodynia: Secondary | ICD-10-CM | POA: Diagnosis not present
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
## 2016-09-02 NOTE — Patient Instructions (Addendum)
[place], [place] [address] [phone]     IF you received an x-ray today, you will receive an invoice from [facility]. Please contact [facility] at [phone] with questions or concerns regarding your invoice.   IF you received labwork today, you will receive an invoice from [place]. Please contact [facility] at [phone] with questions or concerns regarding your invoice.   Our billing staff will not be able to assist you with questions regarding bills from these companies.  You will be contacted with the lab results as soon as they are available. The fastest way to get your results is to activate your My Chart account. Instructions are located on the last page of this paperwork. If you have not heard from [place] regarding the results in 2 weeks, please contact this office.      Rib Contusion A rib contusion is a deep bruise on your rib area. Contusions are the result of a blunt trauma that causes bleeding and injury to the tissues under the skin. A rib contusion may involve bruising of the ribs and of the skin and muscles in the area. The skin overlying the contusion may turn blue, purple, or yellow. Minor injuries will give you a painless contusion, but more severe contusions may stay painful and swollen for a few weeks. What are the causes? A contusion is usually caused by a blow, trauma, or direct force to an area of the body. This often occurs while playing contact sports. What are the signs or symptoms?  Swelling and redness of the injured area.  Discoloration of the injured area.  Tenderness and soreness of the injured area.  Pain with or without movement. How is this diagnosed? The diagnosis can be made by taking a medical history and performing a physical exam. An X-ray, CT scan, or MRI may be needed to determine if there were any associated injuries, such as broken bones (fractures) or internal injuries. How  is this treated? Often, the best treatment for a rib contusion is rest. Icing or applying cold compresses to the injured area may help reduce swelling and inflammation. Deep breathing exercises may be recommended to reduce the risk of partial lung collapse and pneumonia. Over-the-counter or prescription medicines may also be recommended for pain control. Follow these instructions at home:  Apply ice to the injured area: ? Put ice in a plastic bag. ? Place a towel between your skin and the bag. ? Leave the ice on for 20 minutes, 2-3 times per day.  Take medicines only as directed by your health care provider.  Rest the injured area. Avoid strenuous activity and any activities or movements that cause pain. Be careful during activities and avoid bumping the injured area.  Perform deep-breathing exercises as directed by your health care provider.  Do not lift anything that is heavier than 5 lb (2.3 kg) until your health care provider approves.  Do not use any tobacco products, including cigarettes, chewing tobacco, or electronic cigarettes. If you need help quitting, ask your health care provider. Contact a health care provider if:  You have increased bruising or swelling.  You have pain that is not controlled with treatment.  You have a fever. Get help right away if:  You have difficulty breathing or shortness of breath.  You develop a continual cough, or you cough up thick or bloody sputum.  You feel sick to your stomach (nauseous), you throw up (vomit), or you have abdominal pain. This  information is not intended to replace advice given to you by your health care provider. Make sure you discuss any questions you have with your health care provider. Document Released: 11/22/2000 Document Revised: 08/05/2015 Document Reviewed: 12/09/2013 Elsevier Interactive Patient Education  [employer].

## 2016-12-18 ENCOUNTER — Encounter: Payer: Self-pay | Admitting: Internal Medicine

## 2016-12-18 ENCOUNTER — Other Ambulatory Visit: Payer: Self-pay

## 2016-12-18 MED ORDER — LINACLOTIDE 72 MCG PO CAPS: 72.0000 ug | ORAL_CAPSULE | Freq: Every day | ORAL | 0 refills | Status: DC

## 2016-12-28 ENCOUNTER — Ambulatory Visit (INDEPENDENT_AMBULATORY_CARE_PROVIDER_SITE_OTHER): Payer: BLUE CROSS/BLUE SHIELD | Admitting: Internal Medicine

## 2016-12-28 ENCOUNTER — Encounter: Payer: Self-pay | Admitting: Internal Medicine

## 2016-12-28 VITALS — BP 104/76 | HR 64 | Temp 97.0°F | Resp 16 | Ht 63.0 in | Wt 141.0 lb

## 2016-12-28 DIAGNOSIS — Z79899 Other long term (current) drug therapy: Secondary | ICD-10-CM | POA: Diagnosis not present

## 2016-12-28 DIAGNOSIS — R0989 Other specified symptoms and signs involving the circulatory and respiratory systems: Secondary | ICD-10-CM

## 2016-12-28 DIAGNOSIS — R7303 Prediabetes: Secondary | ICD-10-CM

## 2016-12-28 DIAGNOSIS — I10 Essential (primary) hypertension: Secondary | ICD-10-CM | POA: Diagnosis not present

## 2016-12-28 DIAGNOSIS — Z0000 Encounter for general adult medical examination without abnormal findings: Secondary | ICD-10-CM

## 2016-12-28 DIAGNOSIS — Z111 Encounter for screening for respiratory tuberculosis: Secondary | ICD-10-CM | POA: Diagnosis not present

## 2016-12-28 DIAGNOSIS — Z0001 Encounter for general adult medical examination with abnormal findings: Secondary | ICD-10-CM

## 2016-12-28 DIAGNOSIS — E559 Vitamin D deficiency, unspecified: Secondary | ICD-10-CM | POA: Diagnosis not present

## 2016-12-28 DIAGNOSIS — Z1212 Encounter for screening for malignant neoplasm of rectum: Secondary | ICD-10-CM

## 2016-12-28 DIAGNOSIS — Z136 Encounter for screening for cardiovascular disorders: Secondary | ICD-10-CM | POA: Diagnosis not present

## 2016-12-28 DIAGNOSIS — E782 Mixed hyperlipidemia: Secondary | ICD-10-CM

## 2016-12-28 DIAGNOSIS — R5383 Other fatigue: Secondary | ICD-10-CM

## 2016-12-28 DIAGNOSIS — Z1211 Encounter for screening for malignant neoplasm of colon: Secondary | ICD-10-CM

## 2016-12-28 NOTE — Patient Instructions (Signed)
Preventive Care for Adults  A healthy lifestyle and preventive care can promote health and wellness. Preventive health guidelines for women include the following key practices.  A routine yearly physical is a good way to check with your health care provider about your health and preventive screening. It is a chance to share any concerns and updates on your health and to receive a thorough exam.  Visit your dentist for a routine exam and preventive care every 6 months. Brush your teeth twice a day and floss once a day. Good oral hygiene prevents tooth decay and gum disease.  The frequency of eye exams is based on your age, health, family medical history, use of contact lenses, and other factors. Follow your health care provider's recommendations for frequency of eye exams.  Eat a healthy diet. Foods like vegetables, fruits, whole grains, low-fat dairy products, and lean protein foods contain the nutrients you need without too many calories. Decrease your intake of foods high in solid fats, added sugars, and salt. Eat the right amount of calories for you.Get information about a proper diet from your health care provider, if necessary.  Regular physical exercise is one of the most important things you can do for your health. Most adults should get at least 150 minutes of moderate-intensity exercise (any activity that increases your heart rate and causes you to sweat) each week. In addition, most adults need muscle-strengthening exercises on 2 or more days a week.  Maintain a healthy weight. The body mass index (BMI) is a screening tool to identify possible weight problems. It provides an estimate of body fat based on height and weight. Your health care provider can find your BMI and can help you achieve or maintain a healthy weight.For adults 20 years and older:  A BMI below 18.5 is considered underweight.  A BMI of 18.5 to 24.9 is normal.  A BMI of 25 to 29.9 is considered overweight.  A BMI of  30 and above is considered obese.  Maintain normal blood lipids and cholesterol levels by exercising and minimizing your intake of saturated fat. Eat a balanced diet with plenty of fruit and vegetables. Blood tests for lipids and cholesterol should begin at age 67 and be repeated every 5 years. If your lipid or cholesterol levels are high, you are over 50, or you are at high risk for heart disease, you may need your cholesterol levels checked more frequently.Ongoing high lipid and cholesterol levels should be treated with medicines if diet and exercise are not working.  If you smoke, find out from your health care provider how to quit. If you do not use tobacco, do not start.  Lung cancer screening is recommended for adults aged 74-80 years who are at high risk for developing lung cancer because of a history of smoking. A yearly low-dose CT scan of the lungs is recommended for people who have at least a 30-pack-year history of smoking and are a current smoker or have quit within the past 15 years. A pack year of smoking is smoking an average of 1 pack of cigarettes a day for 1 year (for example: 1 pack a day for 30 years or 2 packs a day for 15 years). Yearly screening should continue until the smoker has stopped smoking for at least 15 years. Yearly screening should be stopped for people who develop a health problem that would prevent them from having lung cancer treatment.  High blood pressure causes heart disease and increases the risk of  stroke. Your blood pressure should be checked at least every 1 to 2 years. Ongoing high blood pressure should be treated with medicines if weight loss and exercise do not work.  If you are 55-79 years old, ask your health care provider if you should take aspirin to prevent strokes.  Diabetes screening involves taking a blood sample to check your fasting blood sugar level. This should be done once every 3 years, after age 45, if you are within normal weight and  without risk factors for diabetes. Testing should be considered at a younger age or be carried out more frequently if you are overweight and have at least 1 risk factor for diabetes.  Breast cancer screening is essential preventive care for women. You should practice "breast self-awareness." This means understanding the normal appearance and feel of your breasts and may include breast self-examination. Any changes detected, no matter how small, should be reported to a health care provider. Women in their 20s and 30s should have a clinical breast exam (CBE) by a health care provider as part of a regular health exam every 1 to 3 years. After age 40, women should have a CBE every year. Starting at age 40, women should consider having a mammogram (breast X-ray test) every year. Women who have a family history of breast cancer should talk to their health care provider about genetic screening. Women at a high risk of breast cancer should talk to their health care providers about having an MRI and a mammogram every year.  Breast cancer gene (BRCA)-related cancer risk assessment is recommended for women who have family members with BRCA-related cancers. BRCA-related cancers include breast, ovarian, tubal, and peritoneal cancers. Having family members with these cancers may be associated with an increased risk for harmful changes (mutations) in the breast cancer genes BRCA1 and BRCA2. Results of the assessment will determine the need for genetic counseling and BRCA1 and BRCA2 testing.  Routine pelvic exams to screen for cancer are no longer recommended for nonpregnant women who are considered low risk for cancer of the pelvic organs (ovaries, uterus, and vagina) and who do not have symptoms. Ask your health care provider if a screening pelvic exam is right for you.  If you have had past treatment for cervical cancer or a condition that could lead to cancer, you need Pap tests and screening for cancer for at least 20  years after your treatment. If Pap tests have been discontinued, your risk factors (such as having a new sexual partner) need to be reassessed to determine if screening should be resumed. Some women have medical problems that increase the chance of getting cervical cancer. In these cases, your health care provider may recommend more frequent screening and Pap tests.  Colorectal cancer can be detected and often prevented. Most routine colorectal cancer screening begins at the age of 50 years and continues through age 75 years. However, your health care provider may recommend screening at an earlier age if you have risk factors for colon cancer. On a yearly basis, your health care provider may provide home test kits to check for hidden blood in the stool. Use of a small camera at the end of a tube, to directly examine the colon (sigmoidoscopy or colonoscopy), can detect the earliest forms of colorectal cancer. Talk to your health care provider about this at age 50, when routine screening begins. Direct exam of the colon should be repeated every 5-10 years through age 75 years, unless early forms of pre-cancerous   polyps or small growths are found.  Hepatitis C blood testing is recommended for all people born from 1945 through 1965 and any individual with known risks for hepatitis C.  Pra  Osteoporosis is a disease in which the bones lose minerals and strength with aging. This can result in serious bone fractures or breaks. The risk of osteoporosis can be identified using a bone density scan. Women ages 65 years and over and women at risk for fractures or osteoporosis should discuss screening with their health care providers. Ask your health care provider whether you should take a calcium supplement or vitamin D to reduce the rate of osteoporosis.  Menopause can be associated with physical symptoms and risks. Hormone replacement therapy is available to decrease symptoms and risks. You should talk to your  health care provider about whether hormone replacement therapy is right for you.  Use sunscreen. Apply sunscreen liberally and repeatedly throughout the day. You should seek shade when your shadow is shorter than you. Protect yourself by wearing long sleeves, pants, a wide-brimmed hat, and sunglasses year round, whenever you are outdoors.  Once a month, do a whole body skin exam, using a mirror to look at the skin on your back. Tell your health care provider of new moles, moles that have irregular borders, moles that are larger than a pencil eraser, or moles that have changed in shape or color.  Stay current with required vaccines (immunizations).  Influenza vaccine. All adults should be immunized every year.  Tetanus, diphtheria, and acellular pertussis (Td, Tdap) vaccine. Pregnant women should receive 1 dose of Tdap vaccine during each pregnancy. The dose should be obtained regardless of the length of time since the last dose. Immunization is preferred during the 27th-36th week of gestation. An adult who has not previously received Tdap or who does not know her vaccine status should receive 1 dose of Tdap. This initial dose should be followed by tetanus and diphtheria toxoids (Td) booster doses every 10 years. Adults with an unknown or incomplete history of completing a 3-dose immunization series with Td-containing vaccines should begin or complete a primary immunization series including a Tdap dose. Adults should receive a Td booster every 10 years.  Varicella vaccine. An adult without evidence of immunity to varicella should receive 2 doses or a second dose if she has previously received 1 dose. Pregnant females who do not have evidence of immunity should receive the first dose after pregnancy. This first dose should be obtained before leaving the health care facility. The second dose should be obtained 4-8 weeks after the first dose.  Human papillomavirus (HPV) vaccine. Females aged 13-26 years  who have not received the vaccine previously should obtain the 3-dose series. The vaccine is not recommended for use in pregnant females. However, pregnancy testing is not needed before receiving a dose. If a female is found to be pregnant after receiving a dose, no treatment is needed. In that case, the remaining doses should be delayed until after the pregnancy. Immunization is recommended for any person with an immunocompromised condition through the age of 26 years if she did not get any or all doses earlier. During the 3-dose series, the second dose should be obtained 4-8 weeks after the first dose. The third dose should be obtained 24 weeks after the first dose and 16 weeks after the second dose.  Zoster vaccine. One dose is recommended for adults aged 60 years or older unless certain conditions are present.  Measles, mumps, and rubella (  MMR) vaccine. Adults born before [birth year] generally are considered immune to measles and mumps. Adults born in [birth year] or later should have 1 or more doses of MMR vaccine unless there is a contraindication to the vaccine or there is laboratory evidence of immunity to each of the three diseases. A routine second dose of MMR vaccine should be obtained at least 28 days after the first dose for students attending postsecondary schools, health care workers, or international travelers. People who received inactivated measles vaccine or an unknown type of measles vaccine during 1963-1967 should receive 2 doses of MMR vaccine. People who received inactivated mumps vaccine or an unknown type of mumps vaccine before 1979 and are at high risk for mumps infection should consider immunization with 2 doses of MMR vaccine. For females of childbearing age, rubella immunity should be determined. If there is no evidence of immunity, females who are not pregnant should be vaccinated. If there is no evidence of immunity, females who are pregnant should delay immunization until after pregnancy.  Unvaccinated health care workers born before [birth year] who lack laboratory evidence of measles, mumps, or rubella immunity or laboratory confirmation of disease should consider measles and mumps immunization with 2 doses of MMR vaccine or rubella immunization with 1 dose of MMR vaccine.  Pneumococcal 13-valent conjugate (PCV13) vaccine. When indicated, a person who is uncertain of her immunization history and has no record of immunization should receive the PCV13 vaccine. An adult aged 39 years or older who has certain medical conditions and has not been previously immunized should receive 1 dose of PCV13 vaccine. This PCV13 should be followed with a dose of pneumococcal polysaccharide (PPSV23) vaccine. The PPSV23 vaccine dose should be obtained at least 8 weeks after the dose of PCV13 vaccine. An adult aged 62 years or older who has certain medical conditions and previously received 1 or more doses of PPSV23 vaccine should receive 1 dose of PCV13. The PCV13 vaccine dose should be obtained 1 or more years after the last PPSV23 vaccine dose.    Pneumococcal polysaccharide (PPSV23) vaccine. When PCV13 is also indicated, PCV13 should be obtained first. All adults aged 67 years and older should be immunized. An adult younger than age 45 years who has certain medical conditions should be immunized. Any person who resides in a nursing home or long-term care facility should be immunized. An adult smoker should be immunized. People with an immunocompromised condition and certain other conditions should receive both PCV13 and PPSV23 vaccines. People with human immunodeficiency virus (HIV) infection should be immunized as soon as possible after diagnosis. Immunization during chemotherapy or radiation therapy should be avoided. Routine use of PPSV23 vaccine is not recommended for American Indians, [place] Natives, or people younger than 65 years unless there are medical conditions that require PPSV23 vaccine. When indicated,  people who have unknown immunization and have no record of immunization should receive PPSV23 vaccine. One-time revaccination 5 years after the first dose of PPSV23 is recommended for people aged 19-64 years who have chronic kidney failure, nephrotic syndrome, asplenia, or immunocompromised conditions. People who received 1-2 doses of PPSV23 before age 23 years should receive another dose of PPSV23 vaccine at age 35 years or later if at least 5 years have passed since the previous dose. Doses of PPSV23 are not needed for people immunized with PPSV23 at or after age 38 years.  Preventive Services / Frequency   Ages 43 to 86 years  Blood pressure check.  Lipid and cholesterol check.  Lung  cancer screening. / Every year if you are aged 64-80 years and have a 30-pack-year history of smoking and currently smoke or have quit within the past 15 years. Yearly screening is stopped once you have quit smoking for at least 15 years or develop a health problem that would prevent you from having lung cancer treatment.  Clinical breast exam.** / Every year after age 36 years.  BRCA-related cancer risk assessment.** / For women who have family members with a BRCA-related cancer (breast, ovarian, tubal, or peritoneal cancers).  Mammogram.** / Every year beginning at age 109 years and continuing for as long as you are in good health. Consult with your health care provider.  Pap test.** / Every 3 years starting at age 39 years through age 65 or 28 years with a history of 3 consecutive normal Pap tests.  HPV screening.** / Every 3 years from ages 91 years through ages 32 to 78 years with a history of 3 consecutive normal Pap tests.  Fecal occult blood test (FOBT) of stool. / Every year beginning at age 26 years and continuing until age 12 years. You may not need to do this test if you get a colonoscopy every 10 years.  Flexible sigmoidoscopy or colonoscopy.** / Every 5 years for a flexible sigmoidoscopy or  every 10 years for a colonoscopy beginning at age 34 years and continuing until age 60 years.  Hepatitis C blood test.** / For all people born from [birth year] through 1965 and any individual with known risks for hepatitis C.  Skin self-exam. / Monthly.  Influenza vaccine. / Every year.  Tetanus, diphtheria, and acellular pertussis (Tdap/Td) vaccine.** / Consult your health care provider. Pregnant women should receive 1 dose of Tdap vaccine during each pregnancy. 1 dose of Td every 10 years.  Varicella vaccine.** / Consult your health care provider. Pregnant females who do not have evidence of immunity should receive the first dose after pregnancy.  Zoster vaccine.** / 1 dose for adults aged 11 years or older.  Pneumococcal 13-valent conjugate (PCV13) vaccine.** / Consult your health care provider.  Pneumococcal polysaccharide (PPSV23) vaccine.** / 1 to 2 doses if you smoke cigarettes or if you have certain conditions.  Meningococcal vaccine.** / Consult your health care provider.  Hepatitis A vaccine.** / Consult your health care provider.  Hepatitis B vaccine.** / Consult your health care provider. Screening for abdominal aortic aneurysm (AAA)  by ultrasound is recommended for people over 50 who have history of high blood pressure or who are current or former smokers. ++++++++++++++++++ Recommend Adult Low Dose Aspirin or  coated  Aspirin 81 mg daily  To reduce risk of Colon Cancer 20 %,  Skin Cancer 26 % ,  Melanoma 46%  and  Pancreatic cancer 60% +++++++++++++++++++ Vitamin D goal  is between 70-100.  Please make sure that you are taking your Vitamin D as directed.  It is very important as a natural anti-inflammatory  helping hair, skin, and nails, as well as reducing stroke and heart attack risk.  It helps your bones and helps with mood. It also decreases numerous cancer risks so please take it as directed.  Low Vit D is associated with a 200-300% higher risk for CANCER  and  200-300% higher risk for HEART   ATTACK  &  STROKE.   .....................................Marland Kitchen It is also associated with higher death rate at younger ages,  autoimmune diseases like Rheumatoid arthritis, Lupus, Multiple Sclerosis.    Also many other serious conditions, like depression,  Alzheimer's Dementia, infertility, muscle aches, fatigue, fibromyalgia - just to name a few. ++++++++++++++++++ Recommend the book "The END of DIETING" by Dr Excell Seltzer  & the book "The END of DIABETES " by Dr Excell Seltzer At [email or web address] - get book & Audio CD's    Being diabetic has a  300% increased risk for heart attack, stroke, cancer, and alzheimer- type vascular dementia. It is very important that you work harder with diet by avoiding all foods that are white. Avoid white rice (brown & wild rice is OK), white potatoes (sweetpotatoes in moderation is OK), White bread or wheat bread or anything made out of white flour like bagels, donuts, rolls, buns, biscuits, cakes, pastries, cookies, pizza crust, and pasta (made from white flour & egg whites) - vegetarian pasta or spinach or wheat pasta is OK. Multigrain breads like Arnold's or Pepper[employer], or multigrain sandwich thins or flatbreads.  Diet, exercise and weight loss can reverse and cure diabetes in the early stages.  Diet, exercise and weight loss is very important in the control and prevention of complications of diabetes which affects every system in your body, ie. Brain - dementia/stroke, eyes - glaucoma/blindness, heart - heart attack/heart failure, kidneys - dialysis, stomach - gastric paralysis, intestines - malabsorption, nerves - severe painful neuritis, circulation - gangrene & loss of a leg(s), and finally cancer and Alzheimers.    I recommend avoid fried & greasy foods,  sweets/candy, white rice (brown or wild rice or Quinoa is OK), white potatoes (sweet potatoes are OK) - anything made from white flour - bagels, doughnuts, rolls, buns, biscuits,white  and wheat breads, pizza crust and traditional pasta made of white flour & egg white(vegetarian pasta or spinach or wheat pasta is OK).  Multi-grain bread is OK - like multi-grain flat bread or sandwich thins. Avoid alcohol in excess. Exercise is also important.    Eat all the vegetables you want - avoid meat, especially red meat and dairy - especially cheese.  Cheese is the most concentrated form of trans-fats which is the worst thing to clog up our arteries. Veggie cheese is OK which can be found in the fresh produce section at [employer] or [employer] or [employer]  ++++++++++++++++++++++ DASH Eating Plan  DASH stands for "Dietary Approaches to Stop Hypertension."   The DASH eating plan is a healthy eating plan that has been shown to reduce high blood pressure (hypertension). Additional health benefits may include reducing the risk of type 2 diabetes mellitus, heart disease, and stroke. The DASH eating plan may also help with weight loss. WHAT DO I NEED TO KNOW ABOUT THE DASH EATING PLAN? For the DASH eating plan, you will follow these general guidelines:  Choose foods with a percent daily value for sodium of less than 5% (as listed on the food label).  Use salt-free seasonings or herbs instead of table salt or sea salt.  Check with your health care provider or pharmacist before using salt substitutes.  Eat lower-sodium products, often labeled as "lower sodium" or "no salt added."  Eat fresh foods.  Eat more vegetables, fruits, and low-fat dairy products.  Choose whole grains. Look for the word "whole" as the first word in the ingredient list.  Choose fish   Limit sweets, desserts, sugars, and sugary drinks.  Choose heart-healthy fats.  Eat veggie cheese   Eat more home-cooked food and less restaurant, buffet, and fast food.  Limit fried foods.  Cook foods using methods other than frying.  Limit canned  vegetables. If you do use them, rinse them well to decrease the  sodium.  When eating at a restaurant, ask that your food be prepared with less salt, or no salt if possible.                      WHAT FOODS CAN I EAT? Read Dr Fara Olden Fuhrman's books on The End of Dieting & The End of Diabetes  Grains Whole grain or whole wheat bread. Brown rice. Whole grain or whole wheat pasta. Quinoa, bulgur, and whole grain cereals. Low-sodium cereals. Corn or whole wheat flour tortillas. Whole grain cornbread. Whole grain crackers. Low-sodium crackers.  Vegetables Fresh or frozen vegetables (raw, steamed, roasted, or grilled). Low-sodium or reduced-sodium tomato and vegetable juices. Low-sodium or reduced-sodium tomato sauce and paste. Low-sodium or reduced-sodium canned vegetables.   Fruits All fresh, canned (in natural juice), or frozen fruits.  Protein Products  All fish and seafood.  Dried beans, peas, or lentils. Unsalted nuts and seeds. Unsalted canned beans.  Dairy Low-fat dairy products, such as skim or 1% milk, 2% or reduced-fat cheeses, low-fat ricotta or cottage cheese, or plain low-fat yogurt. Low-sodium or reduced-sodium cheeses.  Fats and Oils Tub margarines without trans fats. Light or reduced-fat mayonnaise and salad dressings (reduced sodium). Avocado. Safflower, olive, or canola oils. Natural peanut or almond butter.  Other Unsalted popcorn and pretzels. The items listed above may not be a complete list of recommended foods or beverages. Contact your dietitian for more options.  ++++++++++++++++++  WHAT FOODS ARE NOT RECOMMENDED? Grains/ White flour or wheat flour White bread. White pasta. White rice. Refined cornbread. Bagels and croissants. Crackers that contain trans fat.  Vegetables  Creamed or fried vegetables. Vegetables in a . Regular canned vegetables. Regular canned tomato sauce and paste. Regular tomato and vegetable juices.  Fruits Dried fruits. Canned fruit in light or heavy syrup. Fruit juice.  Meat and Other Protein  Products Meat in general - RED meat & White meat.  Fatty cuts of meat. Ribs, chicken wings, all processed meats as bacon, sausage, bologna, salami, fatback, hot dogs, bratwurst and packaged luncheon meats.  Dairy Whole or 2% milk, cream, half-and-half, and cream cheese. Whole-fat or sweetened yogurt. Full-fat cheeses or blue cheese. Non-dairy creamers and whipped toppings. Processed cheese, cheese spreads, or cheese curds.  Condiments Onion and garlic salt, seasoned salt, table salt, and sea salt. Canned and packaged gravies. Worcestershire sauce. Tartar sauce. Barbecue sauce. Teriyaki sauce. Soy sauce, including reduced sodium. Steak sauce. Fish sauce. Oyster sauce. Cocktail sauce. Horseradish. Ketchup and mustard. Meat flavorings and tenderizers. Bouillon cubes. Hot sauce. Tabasco sauce. Marinades. Taco seasonings. Relishes.  Fats and Oils Butter, stick margarine, lard, shortening and bacon fat. Coconut, palm kernel, or palm oils. Regular salad dressings.  Pickles and olives. Salted popcorn and pretzels.  The items listed above may not be a complete list of foods and beverages to avoid.

## 2016-12-28 NOTE — Progress Notes (Signed)
[facility] Unk Pinto, M.D.     Uvaldo Bristle. Silverio Lay, P.A.-C Liane Comber, [place] [address] [place], [place] [address] Telephone [phone] Telefax [phone] Annual Screening/Preventative Visit & Comprehensive Evaluation &  Examination     This very nice 46 y.o. MWF presents for a Screening/Preventative Visit & comprehensive evaluation and management of multiple medical co-morbidities.  Patient has been followed for HTN, Prediabetes, Hyperlipidemia and Vitamin D Deficiency.      HTN predates since 2016 treated with Ziac for 1 year and over the last year off meds is being followed expectantly. Patient reports BP has been controlled at home and patient denies any cardiac symptoms as chest pain, palpitations, shortness of breath, dizziness or ankle swelling. Today's BP is at goal - 104/76.      Patient's hyperlipidemia is not controlled with diet and medications. Patient denies myalgias or other medication SE's. Last lipids were not at goal: Lab Results  Component Value Date   CHOL 225 (H) 12/16/2015   HDL 51 12/16/2015   LDLCALC 157 (H) 12/16/2015   TRIG 87 12/16/2015   CHOLHDL 4.4 12/16/2015      Patient has prediabetes (A1c 5.8% in 2011)  and patient denies reactive hypoglycemic symptoms, visual blurring, diabetic polys, or paresthesias. Last A1c was at goal: Lab Results  Component Value Date   HGBA1C 5.2 12/16/2015      Finally, patient has history of Vitamin D Deficiency ("24" in 2008) and last Vitamin D was low: Lab Results  Component Value Date   VD25OH 41 12/16/2015   Current Outpatient Prescriptions on File Prior to Visit  Medication Sig  . albuterol (PROVENTIL HFA;VENTOLIN HFA) 108 (90 Base) MCG/ACT inhaler Inhale 2 puffs into the lungs every 2 (two) hours as needed for wheezing or shortness of breath (cough).  Marland Kitchen aspirin 81 MG tablet Take 81 mg by mouth See admin instructions. Takes  1 tablet on mondays, wednesdays, and fridays  . azelastine (ASTELIN) 0.1 % nasal spray Place 2 sprays into both nostrils 2 (two) times daily. Use in each nostril as directed  . BREO ELLIPTA 200-25 MCG/INH AEPB INHALE 1 PUFF BY MOUTH ONCE DAILY  . cholecalciferol (VITAMIN D) 1000 UNITS tablet Take 2,000 Units by mouth. Takes 3 times per week  . famotidine (PEPCID) 20 MG tablet Take 20 mg by mouth daily.  Marland Kitchen gemfibrozil (LOPID) 600 MG tablet TAKE ONE TABLET BY MOUTH TWICE DAILY FOR  CHOLESTEROL  . guaiFENesin (MUCINEX) 600 MG 12 hr tablet Take 600 mg by mouth 2 (two) times daily.   Marland Kitchen linaclotide (LINZESS) 72 MCG capsule Take 1 capsule (72 mcg total) by mouth daily before breakfast.  . Magnesium 400 MG CAPS Take 400 mg by mouth daily.  . meloxicam (MOBIC) 15 MG tablet TAKE ONE TABLET BY MOUTH ONCE DAILY AS NEEDED  . Olopatadine HCl 0.2 % SOLN Use 1-2 drops per eye daily  . vitamin E 400 UNIT capsule Take 400 Units by mouth 2 (two) times daily.   No current facility-administered medications on file prior to visit.    Allergies  Allergen Reactions  . Food     strawberries   Past Medical History:  Diagnosis Date  . Acne   . Arthritis   . Asthma   . Chronic kidney disease   . Difficulty sleeping    due to discomfort  . Elevated cholesterol   . GERD (gastroesophageal reflux disease)   . HLD (hyperlipidemia)   .  IBS (irritable bowel syndrome)   . Nausea    Health Maintenance  Topic Date Due  . HIV Screening  02/10/1986  . PAP SMEAR  08/11/2017  . TETANUS/TDAP  09/18/2022  . INFLUENZA VACCINE  Completed   Immunization History  Administered Date(s) Administered  . Hepatitis B 09/08/2015, 10/08/2015, 03/08/2016  . Influenza Split 12/05/2016  . Influenza-Unspecified 12/26/2013, 11/26/2014, 12/21/2015  . PPD Test 11/25/2013  . Tdap 09/17/2012   Past Surgical History:  Procedure Laterality Date  . CHOLECYSTECTOMY  07/27/2011   Procedure: LAPAROSCOPIC CHOLECYSTECTOMY WITH  INTRAOPERATIVE CHOLANGIOGRAM;  Surgeon: Shann Medal, MD;  Location: [facility];  Service: General;  Laterality: N/A;  . ECTOPIC PREGNANCY SURGERY  2005  . ESOPHAGOGASTRODUODENOSCOPY  06/13/2011   Procedure: ESOPHAGOGASTRODUODENOSCOPY (EGD);  Surgeon: Jerene Bears, MD;  Location: [facility];  Service: Gastroenterology;  Laterality: N/A;  . PARTIAL HYSTERECTOMY  2009   Family History  Problem Relation Age of Onset  . Breast cancer Mother   . Lymphoma Mother   . Migraines Mother   . Hyperlipidemia Mother   . Diabetes type II Father   . Diabetes Father   . Migraines Sister   . Hyperlipidemia Sister   . Stroke Maternal Grandmother    Social History  Substance Use Topics  . Smoking status: Never Smoker  . Smokeless tobacco: Never Used  . Alcohol use No    ROS Constitutional: Denies fever, chills, weight loss/gain, headaches, insomnia,  night sweats, and change in appetite. Does c/o fatigue. Eyes: Denies redness, blurred vision, diplopia, discharge, itchy, watery eyes.  ENT: Denies discharge, congestion, post nasal drip, epistaxis, sore throat, earache, hearing loss, dental pain, Tinnitus, Vertigo, Sinus pain, snoring.  Cardio: Denies chest pain, palpitations, irregular heartbeat, syncope, dyspnea, diaphoresis, orthopnea, PND, claudication, edema Respiratory: denies cough, dyspnea, DOE, pleurisy, hoarseness, laryngitis, wheezing.  Gastrointestinal: Denies dysphagia, heartburn, reflux, water brash, pain, cramps, nausea, vomiting, bloating, diarrhea, constipation, hematemesis, melena, hematochezia, jaundice, hemorrhoids Genitourinary: Denies dysuria, frequency, urgency, nocturia, hesitancy, discharge, hematuria, flank pain Breast: Breast lumps, nipple discharge, bleeding.  Musculoskeletal: Denies arthralgia, myalgia, stiffness, Jt. Swelling, pain, limp, and strain/sprain. Denies falls. Skin: Denies puritis, rash, hives, warts, acne, eczema, changing in skin lesion Neuro: No weakness, tremor,  incoordination, spasms, paresthesia, pain Psychiatric: Denies confusion, memory loss, sensory loss. Denies Depression. Endocrine: Denies change in weight, skin, hair change, nocturia, and paresthesia, diabetic polys, visual blurring, hyper / hypo glycemic episodes.  Heme/Lymph: No excessive bleeding, bruising, enlarged lymph nodes.  Physical Exam  BP 104/76   Pulse 64   Temp (!) 97 F (36.1 C)   Resp 16   Ht 5\' 3"  (1.6 m)   Wt 141 lb (64 kg)   BMI 24.98 kg/m   General Appearance: Well nourished, well groomed and in no apparent distress.  Eyes: PERRLA, EOMs, conjunctiva no swelling or erythema, normal fundi and vessels. Sinuses: No frontal/maxillary tenderness ENT/Mouth: EACs patent / TMs  nl. Nares clear without erythema, swelling, mucoid exudates. Oral hygiene is good. No erythema, swelling, or exudate. Tongue normal, non-obstructing. Tonsils not swollen or erythematous. Hearing normal.  Neck: Supple, thyroid normal. No bruits, nodes or JVD. Respiratory: Respiratory effort normal.  BS equal and clear bilateral without rales, rhonci, wheezing or stridor. Cardio: Heart sounds are normal with regular rate and rhythm and no murmurs, rubs or gallops. Peripheral pulses are normal and equal bilaterally without edema. No aortic or femoral bruits. Chest: symmetric with normal excursions and percussion. Breasts: per GYN  Abdomen: Flat, soft with bowel  sounds active. Nontender, no guarding, rebound, hernias, masses, or organomegaly.  Lymphatics: Non tender without lymphadenopathy.  Musculoskeletal: Full ROM all peripheral extremities, joint stability, 5/5 strength, and normal gait. Skin: Warm and dry without rashes, lesions, cyanosis, clubbing or  ecchymosis.  Neuro: Cranial nerves intact, reflexes equal bilaterally. Normal muscle tone, no cerebellar symptoms. Sensation intact.  Pysch: Alert and oriented X 3, normal affect, Insight and Judgment appropriate.   Assessment and Plan  1. Annual  Preventative Screening Examination  2. Labile hypertension  - EKG 12-Lead - Urinalysis, Routine w reflex microscopic - Microalbumin / creatinine urine ratio - CBC with Differential/Platelet - BASIC METABOLIC PANEL WITH GFR - Magnesium - TSH  3. Hyperlipidemia, mixed  - EKG 12-Lead - Hepatic function panel - Lipid panel  4. Prediabetes  - EKG 12-Lead - Hemoglobin A1c - Insulin, random  5. Vitamin D deficiency  - VITAMIN D 25 Hydroxy   6. Encounter for colorectal cancer screening  - POC Hemoccult Bld/Stl   7. Screening for ischemic heart disease  - EKG 12-Lead  8. Fatigue, unspecified type  - Iron,Total/Total Iron Binding Cap - Vitamin B12 - CBC with Differential/Platelet  9. Medication management  - Urinalysis, Routine w reflex microscopic - Microalbumin / creatinine urine ratio - CBC with Differential/Platelet - BASIC METABOLIC PANEL WITH GFR - Hepatic function panel - Magnesium - Lipid panel - TSH - Hemoglobin A1c - Insulin, random - VITAMIN D 25 Hydroxy   10. Screening examination for pulmonary tuberculosis  - PPD      Patient was counseled in prudent diet to achieve/maintain BMI less than 25 for weight control, BP monitoring, regular exercise and medications. Discussed med's effects and SE's. Screening labs and tests as requested with regular follow-up as recommended. Over 40 minutes of exam, counseling, chart review and high complex critical decision making was performed.

## 2016-12-29 ENCOUNTER — Other Ambulatory Visit: Payer: Self-pay | Admitting: Internal Medicine

## 2016-12-29 DIAGNOSIS — E782 Mixed hyperlipidemia: Secondary | ICD-10-CM

## 2016-12-29 LAB — CBC WITH DIFFERENTIAL/PLATELET
BASOS PCT: 0.9 %
Basophils Absolute: 52 cells/uL (ref 0–200)
EOS PCT: 1.6 %
Eosinophils Absolute: 93 cells/uL (ref 15–500)
HCT: 45.7 % — ABNORMAL HIGH (ref 35.0–45.0)
Hemoglobin: 15.4 g/dL (ref 11.7–15.5)
LYMPHS ABS: 911 {cells}/uL (ref 850–3900)
MCH: 29 pg (ref 27.0–33.0)
MCHC: 33.7 g/dL (ref 32.0–36.0)
MCV: 86.1 fL (ref 80.0–100.0)
MPV: 10.5 fL (ref 7.5–12.5)
Monocytes Relative: 5.7 %
Neutro Abs: 4414 cells/uL (ref 1500–7800)
Neutrophils Relative %: 76.1 %
PLATELETS: 264 10*3/uL (ref 140–400)
RBC: 5.31 10*6/uL — AB (ref 3.80–5.10)
RDW: 12.4 % (ref 11.0–15.0)
TOTAL LYMPHOCYTE: 15.7 %
WBC: 5.8 10*3/uL (ref 3.8–10.8)
WBCMIX: 331 {cells}/uL (ref 200–950)

## 2016-12-29 LAB — HEPATIC FUNCTION PANEL
AG Ratio: 2.3 (calc) (ref 1.0–2.5)
ALBUMIN MSPROF: 4.8 g/dL (ref 3.6–5.1)
ALT: 10 U/L (ref 6–29)
AST: 14 U/L (ref 10–35)
Alkaline phosphatase (APISO): 58 U/L (ref 33–115)
BILIRUBIN DIRECT: 0.1 mg/dL (ref 0.0–0.2)
GLOBULIN: 2.1 g/dL (ref 1.9–3.7)
Indirect Bilirubin: 0.7 mg/dL (calc) (ref 0.2–1.2)
Total Bilirubin: 0.8 mg/dL (ref 0.2–1.2)
Total Protein: 6.9 g/dL (ref 6.1–8.1)

## 2016-12-29 LAB — VITAMIN B12: Vitamin B-12: 313 pg/mL (ref 200–1100)

## 2016-12-29 LAB — BASIC METABOLIC PANEL WITH GFR
BUN: 16 mg/dL (ref 7–25)
CALCIUM: 9.5 mg/dL (ref 8.6–10.2)
CHLORIDE: 102 mmol/L (ref 98–110)
CO2: 26 mmol/L (ref 20–32)
Creat: 0.66 mg/dL (ref 0.50–1.10)
GFR, EST AFRICAN AMERICAN: 124 mL/min/{1.73_m2} (ref >=60)
GFR, Est Non African American: 107 mL/min/{1.73_m2} (ref >=60)
Glucose, Bld: 78 mg/dL (ref 65–99)
POTASSIUM: 4.6 mmol/L (ref 3.5–5.3)
Sodium: 138 mmol/L (ref 135–146)

## 2016-12-29 LAB — URINALYSIS, ROUTINE W REFLEX MICROSCOPIC
Bilirubin Urine: NEGATIVE
GLUCOSE, UA: NEGATIVE
HGB URINE DIPSTICK: NEGATIVE
Ketones, ur: NEGATIVE
LEUKOCYTES UA: NEGATIVE
NITRITE: NEGATIVE
PROTEIN: NEGATIVE
Specific Gravity, Urine: 1.005 (ref 1.001–1.03)
pH: 7 (ref 5.0–8.0)

## 2016-12-29 LAB — VITAMIN D 25 HYDROXY (VIT D DEFICIENCY, FRACTURES): Vit D, 25-Hydroxy: 36 ng/mL (ref 30–100)

## 2016-12-29 LAB — IRON, TOTAL/TOTAL IRON BINDING CAP
%SAT: 43 % (ref 11–50)
Iron: 151 ug/dL (ref 40–190)
TIBC: 354 mcg/dL (calc) (ref 250–450)

## 2016-12-29 LAB — LIPID PANEL
Cholesterol: 198 mg/dL (ref <200)
HDL: 61 mg/dL (ref >50)
LDL Cholesterol (Calc): 121 mg/dL (calc) — ABNORMAL HIGH
NON-HDL CHOLESTEROL (CALC): 137 mg/dL — AB (ref <130)
TRIGLYCERIDES: 70 mg/dL (ref <150)
Total CHOL/HDL Ratio: 3.2 (calc) (ref <5.0)

## 2016-12-29 LAB — TSH: TSH: 1.01 m[IU]/L

## 2016-12-29 LAB — MICROALBUMIN / CREATININE URINE RATIO: Creatinine, Urine: 18 mg/dL — ABNORMAL LOW (ref 20–275)

## 2016-12-29 LAB — HEMOGLOBIN A1C
EAG (MMOL/L): 5.7 (calc)
Hgb A1c MFr Bld: 5.2 % of total Hgb (ref <5.7)
Mean Plasma Glucose: 103 (calc)

## 2016-12-29 LAB — MAGNESIUM: MAGNESIUM: 2.1 mg/dL (ref 1.5–2.5)

## 2016-12-29 LAB — INSULIN, RANDOM: Insulin: 4.2 u[IU]/mL (ref 2.0–19.6)

## 2016-12-29 MED ORDER — EZETIMIBE 10 MG PO TABS: ORAL_TABLET | ORAL | 1 refills | Status: DC

## 2016-12-30 ENCOUNTER — Encounter: Payer: Self-pay | Admitting: Internal Medicine

## 2017-01-02 ENCOUNTER — Encounter: Payer: Self-pay | Admitting: Internal Medicine

## 2017-01-02 LAB — TB SKIN TEST
INDURATION: 0 mm
TB Skin Test: NEGATIVE

## 2017-01-04 ENCOUNTER — Other Ambulatory Visit: Payer: Self-pay

## 2017-01-04 MED ORDER — LINACLOTIDE 72 MCG PO CAPS: 72.0000 ug | ORAL_CAPSULE | Freq: Every day | ORAL | 0 refills | Status: DC

## 2017-01-04 MED ORDER — OLOPATADINE HCL 0.2 % OP SOLN: OPHTHALMIC | 3 refills | Status: DC

## 2017-01-23 ENCOUNTER — Other Ambulatory Visit: Payer: Self-pay | Admitting: Internal Medicine

## 2017-02-07 ENCOUNTER — Other Ambulatory Visit: Payer: Self-pay | Admitting: Internal Medicine

## 2017-02-15 ENCOUNTER — Other Ambulatory Visit: Payer: Self-pay | Admitting: Internal Medicine

## 2017-03-22 NOTE — Progress Notes (Signed)
FOLLOW UP  Assessment and Plan:   Hypertension Well controlled with current medications  Monitor blood pressure at home; patient to call if consistently greater than 130/80 Continue DASH diet.   Reminder to go to the ER if any CP, SOB, nausea, dizziness, severe HA, changes vision/speech, left arm numbness and tingling and jaw pain.  Cholesterol Currently above LDL goal; on zetia and gemfibrozil with fish oil and fiber supplement- poorly tolerated statins in past with severe myalgias Continue low cholesterol diet and exercise.  Check lipid panel.   Other abnormal glucose Recently well controlled Continue diet and exercise.  Perform daily foot/skin check, notify office of any concerning changes.  Defer A1C; check BMP  Vitamin D Def Below goal at last visit; continue supplementation to maintain goal of 70-100 Defer Vit D level  Asthma Recently well controlled without flares Continue respiratory medications   Continue diet and meds as discussed. Further disposition pending results of labs. Discussed med's effects and SE's.   Over 30 minutes of exam, counseling, chart review, and critical decision making was performed.   Future Appointments  Date Time Provider [place]  06/21/2017 10:30 AM Unk Pinto, MD [facility] None  02/04/2018 10:00 AM Unk Pinto, MD [facility] None    ----------------------------------------------------------------------------------------------------------------------  HPI 47 y.o. female  presents for 3 month follow up on hypertension, cholesterol, prediabetes, weight and vitamin D deficiency. She has IBS treated by linzess+ benefiber. She is doing very well. Asthma treated by Adair Patter, doing well without flares.    BMI is Body mass index is 25.69 kg/m., she has been working on diet and exercise. She runs as weather allows.  Wt Readings from Last 3 Encounters:  03/23/17 145 lb (65.8 kg)  12/28/16 141 lb (64 kg)  09/02/16  137 lb 9.6 oz (62.4 kg)   Her blood pressure has been controlled at home, today their BP is BP: 108/78  She does workout. She denies chest pain, shortness of breath, dizziness.   She is on cholesterol medication and denies myalgias. Her cholesterol is not at goal. The cholesterol last visit was:   Lab Results  Component Value Date   CHOL 198 12/28/2016   HDL 61 12/28/2016   LDLCALC 157 (H) 12/16/2015   TRIG 70 12/28/2016   CHOLHDL 3.2 12/28/2016    She has been working on diet and exercise for glucose management with hx of prediabetes, currently well controlled, and denies increased appetite, nausea, paresthesia of the feet, polydipsia, polyuria, visual disturbances and vomiting. Last A1C in the office was:  Lab Results  Component Value Date   HGBA1C 5.2 12/28/2016   Patient is on Vitamin D supplement, increased to 5000 U after last visit which was low:  Lab Results  Component Value Date   VD25OH 36 12/28/2016        Current Medications:  Current Outpatient Medications on File Prior to Visit  Medication Sig  . albuterol (PROVENTIL HFA;VENTOLIN HFA) 108 (90 Base) MCG/ACT inhaler Inhale 2 puffs into the lungs every 2 (two) hours as needed for wheezing or shortness of breath (cough).  Marland Kitchen aspirin 81 MG tablet Take 81 mg by mouth See admin instructions. Takes 1 tablet on mondays, wednesdays, and fridays  . BREO ELLIPTA 200-25 MCG/INH AEPB INHALE 1 PUFF BY MOUTH ONCE DAILY  . cholecalciferol (VITAMIN D) 1000 UNITS tablet Take 5,000 Units by mouth daily.   Marland Kitchen doxycycline (DORYX) 150 MG EC tablet Take 150 mg by mouth daily. with food  . DOXYCYCLINE PO Take  150 mg by mouth daily.  Marland Kitchen ezetimibe (ZETIA) 10 MG tablet Take 1 tablet daily for Cholesterol  . famotidine (PEPCID) 20 MG tablet Take 20 mg by mouth 2 (two) times daily.   Marland Kitchen FLUARIX QUADRIVALENT 0.5 ML injection   . gemfibrozil (LOPID) 600 MG tablet TAKE ONE TABLET BY MOUTH TWICE DAILY FOR  CHOLESTEROL  . guaiFENesin (MUCINEX) 600 MG  12 hr tablet Take 600 mg by mouth 2 (two) times daily.   Marland Kitchen LINZESS 72 MCG capsule TAKE 1 CAPSULE BY MOUTH DAILY BEFORE BREAKFAST  . Magnesium 400 MG CAPS Take 400 mg by mouth daily.  . meloxicam (MOBIC) 15 MG tablet TAKE ONE TABLET BY MOUTH ONCE DAILY AS NEEDED  . Olopatadine HCl 0.2 % SOLN Use 1-2 drops per eye daily (Patient taking differently: as needed. Use 1-2 drops per eye daily)  . SOOLANTRA 1 % CREA   . spironolactone (ALDACTONE) 50 MG tablet Take 50 mg by mouth daily.  . Sulfacetamide Sodium-Sulfur 8-4 % SUSP [facility] FACE ONCE OR TWICE DAILY  . vitamin E 400 UNIT capsule Take 400 Units by mouth daily.   Marland Kitchen azelastine (ASTELIN) 0.1 % nasal spray Place 2 sprays into both nostrils 2 (two) times daily. Use in each nostril as directed   No current facility-administered medications on file prior to visit.      Allergies:  Allergies  Allergen Reactions  . Food     strawberries     Medical History:  Past Medical History:  Diagnosis Date  . Acne   . Arthritis   . Asthma   . Chronic kidney disease   . Difficulty sleeping    due to discomfort  . Elevated cholesterol   . GERD (gastroesophageal reflux disease)   . HLD (hyperlipidemia)   . IBS (irritable bowel syndrome)   . Nausea    Family history- Reviewed and unchanged Social history- Reviewed and unchanged   Review of Systems:  Review of Systems  Constitutional: Negative for malaise/fatigue and weight loss.  HENT: Negative for hearing loss and tinnitus.   Eyes: Negative for blurred vision and double vision.  Respiratory: Negative for cough, shortness of breath and wheezing.   Cardiovascular: Negative for chest pain, palpitations, orthopnea, claudication and leg swelling.  Gastrointestinal: Negative for abdominal pain, blood in stool, constipation, diarrhea, heartburn, melena, nausea and vomiting.  Genitourinary: Negative.   Musculoskeletal: Negative for joint pain and myalgias.  Skin: Negative for rash.  Neurological:  Negative for dizziness, tingling, sensory change, weakness and headaches.  Endo/Heme/Allergies: Negative for polydipsia.  Psychiatric/Behavioral: Negative.   All other systems reviewed and are negative.     Physical Exam: BP 108/78   Pulse 78   Temp 98.1 F (36.7 C)   Ht 5\' 3"  (1.6 m)   Wt 145 lb (65.8 kg)   SpO2 98%   BMI 25.69 kg/m  Wt Readings from Last 3 Encounters:  03/23/17 145 lb (65.8 kg)  12/28/16 141 lb (64 kg)  09/02/16 137 lb 9.6 oz (62.4 kg)   General Appearance: Well nourished, in no apparent distress. Eyes: PERRLA, EOMs, conjunctiva no swelling or erythema Sinuses: No Frontal/maxillary tenderness ENT/Mouth: Ext aud canals clear, TMs without erythema, bulging. No erythema, swelling, or exudate on post pharynx.  Tonsils not swollen or erythematous. Hearing normal.  Neck: Supple, thyroid normal.  Respiratory: Respiratory effort normal, BS equal bilaterally without rales, rhonchi, wheezing or stridor.  Cardio: RRR with no MRGs. Brisk peripheral pulses without edema.  Abdomen: Soft, + BS.  Non tender, no guarding, rebound, hernias, masses. Lymphatics: Non tender without lymphadenopathy.  Musculoskeletal: Full ROM, 5/5 strength, Normal gait Skin: Warm, dry without rashes, lesions, ecchymosis.  Neuro: Cranial nerves intact. No cerebellar symptoms.  Psych: Awake and oriented X 3, normal affect, Insight and Judgment appropriate.    Izora Ribas, NP 10:27 AM [name]

## 2017-03-23 ENCOUNTER — Encounter: Payer: Self-pay | Admitting: Adult Health

## 2017-03-23 ENCOUNTER — Ambulatory Visit (INDEPENDENT_AMBULATORY_CARE_PROVIDER_SITE_OTHER): Payer: BLUE CROSS/BLUE SHIELD | Admitting: Adult Health

## 2017-03-23 VITALS — BP 108/78 | HR 78 | Temp 98.1°F | Ht 63.0 in | Wt 145.0 lb

## 2017-03-23 DIAGNOSIS — J45909 Unspecified asthma, uncomplicated: Secondary | ICD-10-CM | POA: Diagnosis not present

## 2017-03-23 DIAGNOSIS — E785 Hyperlipidemia, unspecified: Secondary | ICD-10-CM | POA: Diagnosis not present

## 2017-03-23 DIAGNOSIS — E559 Vitamin D deficiency, unspecified: Secondary | ICD-10-CM | POA: Diagnosis not present

## 2017-03-23 DIAGNOSIS — R7309 Other abnormal glucose: Secondary | ICD-10-CM | POA: Diagnosis not present

## 2017-03-23 DIAGNOSIS — Z79899 Other long term (current) drug therapy: Secondary | ICD-10-CM | POA: Diagnosis not present

## 2017-03-23 LAB — BASIC METABOLIC PANEL WITH GFR
BUN: 19 mg/dL (ref 7–25)
CO2: 25 mmol/L (ref 20–32)
CREATININE: 0.67 mg/dL (ref 0.50–1.10)
Calcium: 9.3 mg/dL (ref 8.6–10.2)
Chloride: 104 mmol/L (ref 98–110)
GFR, EST AFRICAN AMERICAN: 122 mL/min/{1.73_m2} (ref >=60)
GFR, EST NON AFRICAN AMERICAN: 105 mL/min/{1.73_m2} (ref >=60)
GLUCOSE: 83 mg/dL (ref 65–99)
Potassium: 4.6 mmol/L (ref 3.5–5.3)
SODIUM: 137 mmol/L (ref 135–146)

## 2017-03-23 LAB — HEPATIC FUNCTION PANEL
AG RATIO: 2.5 (calc) (ref 1.0–2.5)
ALT: 11 U/L (ref 6–29)
AST: 13 U/L (ref 10–35)
Albumin: 4.7 g/dL (ref 3.6–5.1)
Alkaline phosphatase (APISO): 57 U/L (ref 33–115)
BILIRUBIN DIRECT: 0.1 mg/dL (ref 0.0–0.2)
GLOBULIN: 1.9 g/dL (ref 1.9–3.7)
Indirect Bilirubin: 0.5 mg/dL (calc) (ref 0.2–1.2)
TOTAL PROTEIN: 6.6 g/dL (ref 6.1–8.1)
Total Bilirubin: 0.6 mg/dL (ref 0.2–1.2)

## 2017-03-23 LAB — LIPID PANEL
CHOLESTEROL: 170 mg/dL (ref <200)
HDL: 52 mg/dL (ref >50)
LDL CHOLESTEROL (CALC): 98 mg/dL
Non-HDL Cholesterol (Calc): 118 mg/dL (calc) (ref <130)
TRIGLYCERIDES: 100 mg/dL (ref <150)
Total CHOL/HDL Ratio: 3.3 (calc) (ref <5.0)

## 2017-03-23 LAB — CBC WITH DIFFERENTIAL/PLATELET
BASOS ABS: 52 {cells}/uL (ref 0–200)
Basophils Relative: 0.7 %
EOS ABS: 89 {cells}/uL (ref 15–500)
Eosinophils Relative: 1.2 %
HEMATOCRIT: 43.9 % (ref 35.0–45.0)
HEMOGLOBIN: 15 g/dL (ref 11.7–15.5)
LYMPHS ABS: 1443 {cells}/uL (ref 850–3900)
MCH: 28.8 pg (ref 27.0–33.0)
MCHC: 34.2 g/dL (ref 32.0–36.0)
MCV: 84.4 fL (ref 80.0–100.0)
MPV: 10.4 fL (ref 7.5–12.5)
Monocytes Relative: 7 %
NEUTROS ABS: 5298 {cells}/uL (ref 1500–7800)
NEUTROS PCT: 71.6 %
Platelets: 275 10*3/uL (ref 140–400)
RBC: 5.2 10*6/uL — ABNORMAL HIGH (ref 3.80–5.10)
RDW: 12.5 % (ref 11.0–15.0)
Total Lymphocyte: 19.5 %
WBC mixed population: 518 cells/uL (ref 200–950)
WBC: 7.4 10*3/uL (ref 3.8–10.8)

## 2017-03-23 LAB — TSH: TSH: 0.85 mIU/L

## 2017-03-23 NOTE — Patient Instructions (Signed)
Fat and Cholesterol Restricted Diet Getting too much fat and cholesterol in your diet may cause health problems. Following this diet helps keep your fat and cholesterol at normal levels. This can keep you from getting sick. What types of fat should I choose?  Choose monosaturated and polyunsaturated fats. These are found in foods such as olive oil, canola oil, flaxseeds, walnuts, almonds, and seeds.  Eat more omega-3 fats. Good choices include salmon, mackerel, sardines, tuna, flaxseed oil, and ground flaxseeds.  Limit saturated fats. These are in animal products such as meats, butter, and cream. They can also be in plant products such as palm oil, palm kernel oil, and coconut oil.  Avoid foods with partially hydrogenated oils in them. These contain trans fats. Examples of foods that have trans fats are stick margarine, some tub margarines, cookies, crackers, and other baked goods. What general guidelines do I need to follow?  Check food labels. Look for the words "trans fat" and "saturated fat."  When preparing a meal: ? Fill half of your plate with vegetables and green salads. ? Fill one fourth of your plate with whole grains. Look for the word "whole" as the first word in the ingredient list. ? Fill one fourth of your plate with lean protein foods.  Eat more foods that have fiber, like apples, carrots, beans, peas, and barley.  Eat more home-cooked foods. Eat less at restaurants and buffets.  Limit or avoid alcohol.  Limit foods high in starch and sugar.  Limit fried foods.  Cook foods without frying them. Baking, boiling, grilling, and broiling are all great options.  Lose weight if you are overweight. Losing even a small amount of weight can help your overall health. It can also help prevent diseases such as diabetes and heart disease. What foods can I eat? Grains Whole grains, such as whole wheat or whole grain breads, crackers, cereals, and pasta. Unsweetened oatmeal,  bulgur, barley, quinoa, or brown rice. Corn or whole wheat flour tortillas. Vegetables Fresh or frozen vegetables (raw, steamed, roasted, or grilled). Green salads. Fruits All fresh, canned (in natural juice), or frozen fruits. Meat and Other Protein Products Ground beef (85% or leaner), grass-fed beef, or beef trimmed of fat. Skinless chicken or turkey. Ground chicken or turkey. Pork trimmed of fat. All fish and seafood. Eggs. Dried beans, peas, or lentils. Unsalted nuts or seeds. Unsalted canned or dry beans. Dairy Low-fat dairy products, such as skim or 1% milk, 2% or reduced-fat cheeses, low-fat ricotta or cottage cheese, or plain low-fat yogurt. Fats and Oils Tub margarines without trans fats. Light or reduced-fat mayonnaise and salad dressings. Avocado. Olive, canola, sesame, or safflower oils. Natural peanut or almond butter (choose ones without added sugar and oil). The items listed above may not be a complete list of recommended foods or beverages. Contact your dietitian for more options. What foods are not recommended? Grains White bread. White pasta. White rice. Cornbread. Bagels, pastries, and croissants. Crackers that contain trans fat. Vegetables White potatoes. Corn. Creamed or fried vegetables. Vegetables in a cheese sauce. Fruits Dried fruits. Canned fruit in light or heavy syrup. Fruit juice. Meat and Other Protein Products Fatty cuts of meat. Ribs, chicken wings, bacon, sausage, bologna, salami, chitterlings, fatback, hot dogs, bratwurst, and packaged luncheon meats. Liver and organ meats. Dairy Whole or 2% milk, cream, half-and-half, and cream cheese. Whole milk cheeses. Whole-fat or sweetened yogurt. Full-fat cheeses. Nondairy creamers and whipped toppings. Processed cheese, cheese spreads, or cheese curds. Sweets and Desserts Corn   syrup, sugars, honey, and molasses. Candy. Jam and jelly. Syrup. Sweetened cereals. Cookies, pies, cakes, donuts, muffins, and ice  cream. Fats and Oils Butter, stick margarine, lard, shortening, ghee, or bacon fat. Coconut, palm kernel, or palm oils. Beverages Alcohol. Sweetened drinks (such as sodas, lemonade, and fruit drinks or punches). The items listed above may not be a complete list of foods and beverages to avoid. Contact your dietitian for more information. This information is not intended to replace advice given to you by your health care provider. Make sure you discuss any questions you have with your health care provider. Document Released: 08/29/2011 Document Revised: 11/04/2015 Document Reviewed: 05/29/2013 [employer] Interactive Patient Education  2018 [employer]  

## 2017-04-04 ENCOUNTER — Encounter: Payer: Self-pay | Admitting: Physician Assistant

## 2017-04-04 ENCOUNTER — Ambulatory Visit (INDEPENDENT_AMBULATORY_CARE_PROVIDER_SITE_OTHER): Payer: BLUE CROSS/BLUE SHIELD | Admitting: Physician Assistant

## 2017-04-04 ENCOUNTER — Other Ambulatory Visit: Payer: Self-pay

## 2017-04-04 ENCOUNTER — Ambulatory Visit (INDEPENDENT_AMBULATORY_CARE_PROVIDER_SITE_OTHER): Payer: BLUE CROSS/BLUE SHIELD

## 2017-04-04 VITALS — BP 108/80 | HR 100 | Temp 98.7°F | Resp 18 | Ht 63.0 in | Wt 148.0 lb

## 2017-04-04 DIAGNOSIS — M79645 Pain in left finger(s): Secondary | ICD-10-CM

## 2017-04-04 DIAGNOSIS — M25532 Pain in left wrist: Secondary | ICD-10-CM

## 2017-04-04 NOTE — Progress Notes (Signed)
Patient ID: [name], female    DOB: 30-Dec-1970, 47 y.o.   MRN: [id number]  PCP: Unk Pinto, MD  Chief Complaint  Patient presents with  . Wrist Injury    left wrist, Pt states she fell yesterday. Pt states she was trying to step over some netting and her foot got caught in the netting and pt believes she used her left wrist/thumb to break her fall. Pt states left wrist and thumb are swollen.    Subjective:   Presents for evaluation of LEFT wrist and thumb pain.   She fell yesterday while feeding her son's chickens. She stepped over some chicken wire and thinks her coat caught on it. She landed on an outstretched LEFT arm, with her thumb and wrist under her.  Immediately felt pain, then developed swelling and bruising over the thumb and wrist. Has pain with use of the thumb. Difficulty to get up from the ground and out of the bathtub.  She is RIGHT hand dominant. Used ice, meloxicam and acetaminophen last night. Has taken nothing this morning.  Review of Systems As above.    Patient Active Problem List   Diagnosis Date Noted  . Other abnormal glucose 04/23/2014  . Medication management 04/23/2014  . Vitamin D deficiency 04/23/2014  . 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 Dyspepsia 06/13/2011  . Epigastric pain 06/13/2011  . Asthma 06/07/2011  . IBS (irritable bowel syndrome) 06/07/2011  . Hyperlipidemia 06/07/2011  . Renal stone 06/07/2011     Prior to Admission medications   Medication Sig Start Date End Date Taking? Authorizing Provider  albuterol (PROVENTIL HFA;VENTOLIN HFA) 108 (90 Base) MCG/ACT inhaler Inhale 2 puffs into the lungs every 2 (two) hours as needed for wheezing or shortness of breath (cough). 02/15/16  Yes Forcucci, Courtney, PA-C  aspirin 81 MG tablet Take 81 mg by mouth See admin instructions. Takes 1 tablet on mondays, wednesdays, and fridays   Yes [provider]  BREO ELLIPTA 200-25 MCG/INH AEPB INHALE 1 PUFF BY MOUTH ONCE  DAILY 02/07/17  Yes Unk Pinto, MD  cholecalciferol (VITAMIN D) 1000 UNITS tablet Take 5,000 Units by mouth daily.    Yes [provider]  doxycycline (DORYX) 150 MG EC tablet Take 150 mg by mouth daily. with food 12/06/16  Yes [provider]  DOXYCYCLINE PO Take 150 mg by mouth daily.   Yes [provider]  ezetimibe (ZETIA) 10 MG tablet Take 1 tablet daily for Cholesterol 12/29/16  Yes Unk Pinto, MD  famotidine (PEPCID) 20 MG tablet Take 20 mg by mouth 2 (two) times daily.    Yes [provider]  FLUARIX QUADRIVALENT 0.5 ML injection  12/05/16  Yes [provider]  gemfibrozil (LOPID) 600 MG tablet TAKE ONE TABLET BY MOUTH TWICE DAILY FOR  CHOLESTEROL 03/20/16  Yes Unk Pinto, MD  guaiFENesin (MUCINEX) 600 MG 12 hr tablet Take 600 mg by mouth 2 (two) times daily.    Yes [provider]  LINZESS 72 MCG capsule TAKE 1 CAPSULE BY MOUTH DAILY BEFORE BREAKFAST 02/15/17  Yes Unk Pinto, MD  Magnesium 400 MG CAPS Take 400 mg by mouth daily.   Yes [provider]  meloxicam (MOBIC) 15 MG tablet TAKE ONE TABLET BY MOUTH ONCE DAILY AS NEEDED 01/23/17  Yes Unk Pinto, MD  Olopatadine HCl 0.2 % SOLN Use 1-2 drops per eye daily Patient taking differently: as needed. Use 1-2 drops per eye daily 01/04/17  Yes Unk Pinto, MD  Omega-3 Fatty Acids (FISH OIL) 1000 MG CAPS Take 1 capsule by mouth daily.   Yes [provider]  SOOLANTRA 1 % CREA  11/13/16  Yes [provider]  spironolactone (ALDACTONE) 50 MG tablet Take 50 mg by mouth daily.   Yes [provider]  Sulfacetamide Sodium-Sulfur 8-4 % SUSP [place] FACE ONCE OR TWICE DAILY 11/28/16  Yes [provider]  vitamin E 400 UNIT capsule Take 400 Units by mouth daily.    Yes [provider]  azelastine (ASTELIN) 0.1 % nasal spray Place 2 sprays into both nostrils 2 (two) times daily. Use in each nostril as directed 02/24/16  02/23/17  Forcucci, Loma Sousa, PA-C     Allergies  Allergen Reactions  . Food     strawberries       Objective:  Physical Exam  Constitutional: She is oriented to person, place, and time. She appears well-developed and well-nourished. She is active and cooperative. No distress.  BP 108/80 (BP Location: Right Arm, Patient Position: Sitting, Cuff Size: Normal)   Pulse 100   Temp 98.7 F (37.1 C) (Oral)   Resp 18   Ht 5\' 3"  (1.6 m)   Wt 148 lb (67.1 kg)   SpO2 98%   BMI 26.22 kg/m    Eyes: Conjunctivae are normal.  Pulmonary/Chest: Effort normal.  Musculoskeletal:       Left elbow: Normal.       Left wrist: She exhibits tenderness and bony tenderness. She exhibits normal range of motion, no swelling, no effusion, no crepitus, no deformity and no laceration.       Left forearm: Normal.       Left hand: She exhibits decreased range of motion (thumb, due to pain), tenderness, bony tenderness and swelling. She exhibits normal two-point discrimination, normal capillary refill, no deformity and no laceration. Normal sensation noted. Decreased strength noted. She exhibits no finger abduction, no thumb/finger opposition and no wrist extension trouble.       Hands: Neurological: She is alert and oriented to person, place, and time.  Psychiatric: She has a normal mood and affect. Her speech is normal and behavior is normal.       Dg Wrist 2 Views Left  Result Date: 04/04/2017 CLINICAL DATA:  Patient ports falling on the hand yesterday and has persistent pain swelling and difficulty with movement. EXAM: LEFT WRIST - 2 VIEW COMPARISON:  Left thumb series of today's date FINDINGS: The bones of the wrist are subjectively adequately mineralized. There is no acute fracture or dislocation. The joint spaces are well maintained. The overlying soft tissues exhibit mild swelling. IMPRESSION: There is no acute bony abnormality of the left wrist. Electronically Signed   By: David  Martinique M.D.   On:  04/04/2017 11:24   Dg Finger Thumb Left  Result Date: 04/04/2017 CLINICAL DATA:  Left thumb pains status post fall yesterday with tenderness and swelling and difficulty with movements. EXAM: LEFT THUMB 2+V COMPARISON:  Left wrist series of today's date FINDINGS: The bones are subjectively adequately mineralized. There mild narrowing of the interphalangeal and MCP joints. The [facility] joint spaces reasonably well-maintained. There is no acute phalangeal or first metacarpal fracture. There is no more than minimal soft tissue swelling. IMPRESSION: No definite acute fracture of the left thumb. There is degenerative narrowing of the IP joint space and of the first MCP joint space. Electronically Signed   By: David  Martinique M.D.   On: 04/04/2017 11:25       Assessment &  Plan:   1. Pain of left thumb 2. Wrist pain, acute, left Thumb spica splint placed. Remove for bathing. Continue meloxicam and application of ice. If pain persists in 2 weeks, or is not improving in 1 week, needs re-evaluation and likely repeat radiographs, either here or with her primary care provider. - DG Finger Thumb Left; Future - DG Wrist 2 Views Left; Future - Thumb spica    No Follow-up on file.   Fara Chute, PA-C [facility]

## 2017-04-04 NOTE — Patient Instructions (Addendum)
Wear the splint as much as you can. Remove for bathing. Delegate dishwashing and other chores! Continue the meloxicam. Use ice for 15-20 minutes 2-4 times/day. If you still have pain in 2 weeks, or are not noticing much improvement in 7 days, please return here or see your primary care office for re-evaluation and repeat radiographs.    IF you received an x-ray today, you will receive an invoice from [facility]. Please contact [facility] at [phone] with questions or concerns regarding your invoice.   IF you received labwork today, you will receive an invoice from [facility]. Please contact [facility] at [phone] with questions or concerns regarding your invoice.   Our billing staff will not be able to assist you with questions regarding bills from these companies.  You will be contacted with the lab results as soon as they are available. The fastest way to get your results is to activate your My Chart account. Instructions are located on the last page of this paperwork. If you have not heard from [place] regarding the results in 2 weeks, please contact this office.

## 2017-04-04 NOTE — Progress Notes (Signed)
Subjective:    Patient ID: [name], female    DOB: [birth year], 47 y.o.   MRN: [id number]  Chief Complaint  Patient presents with  . Wrist Injury    left wrist, Pt states she fell yesterday. Pt states she was trying to step over some netting and her foot got caught in the netting and pt believes she used her left wrist/thumb to break her fall. Pt states left wrist and thumb are swollen.   Presents today for left wrist and thumb pain.  She is right hand dominant.  She fell yesterday while feeding her son's chickens. She thinks she got caught in the chicken wire she stepped over. She attempted to catch herself with an outstretch left arm.  Immediately felt pain. Since the area has been sore, tight, and feels shooting pain from her left thumb to her left wrist. Pain does not exceed to elbow. Area has been bruised and swollen. Difficulty getting out of the bathtub. Denies any numbness and tingling.   Took Ibuprofen after the fall. Has used ice, Meloxicam, and Tylenol last night. Has not taken anything today.   Review of Systems As above.  Patient Active Problem List   Diagnosis Date Noted  . Other abnormal glucose 04/23/2014  . Medication management 04/23/2014  . Vitamin D deficiency 04/23/2014  . 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 Dyspepsia 06/13/2011  . Epigastric pain 06/13/2011  . Asthma 06/07/2011  . IBS (irritable bowel syndrome) 06/07/2011  . Hyperlipidemia 06/07/2011  . Renal stone 06/07/2011   Prior to Admission medications   Medication Sig Start Date End Date Taking? Authorizing Provider  albuterol (PROVENTIL HFA;VENTOLIN HFA) 108 (90 Base) MCG/ACT inhaler Inhale 2 puffs into the lungs every 2 (two) hours as needed for wheezing or shortness of breath (cough). 02/15/16  Yes Forcucci, Courtney, PA-C  aspirin 81 MG tablet Take 81 mg by mouth See admin instructions. Takes 1 tablet on mondays, wednesdays, and fridays   Yes [provider]  BREO ELLIPTA 200-25  MCG/INH AEPB INHALE 1 PUFF BY MOUTH ONCE DAILY 02/07/17  Yes Unk Pinto, MD  cholecalciferol (VITAMIN D) 1000 UNITS tablet Take 5,000 Units by mouth daily.    Yes [provider]  doxycycline (DORYX) 150 MG EC tablet Take 150 mg by mouth daily. with food 12/06/16  Yes [provider]  DOXYCYCLINE PO Take 150 mg by mouth daily.   Yes [provider]  ezetimibe (ZETIA) 10 MG tablet Take 1 tablet daily for Cholesterol 12/29/16  Yes Unk Pinto, MD  famotidine (PEPCID) 20 MG tablet Take 20 mg by mouth 2 (two) times daily.    Yes [provider]  FLUARIX QUADRIVALENT 0.5 ML injection  12/05/16  Yes [provider]  gemfibrozil (LOPID) 600 MG tablet TAKE ONE TABLET BY MOUTH TWICE DAILY FOR  CHOLESTEROL 03/20/16  Yes Unk Pinto, MD  guaiFENesin (MUCINEX) 600 MG 12 hr tablet Take 600 mg by mouth 2 (two) times daily.    Yes [provider]  LINZESS 72 MCG capsule TAKE 1 CAPSULE BY MOUTH DAILY BEFORE BREAKFAST 02/15/17  Yes Unk Pinto, MD  Magnesium 400 MG CAPS Take 400 mg by mouth daily.   Yes [provider]  meloxicam (MOBIC) 15 MG tablet TAKE ONE TABLET BY MOUTH ONCE DAILY AS NEEDED 01/23/17  Yes Unk Pinto, MD  Olopatadine HCl 0.2 % SOLN Use 1-2 drops per eye daily Patient taking differently: as needed. Use 1-2 drops per eye daily 01/04/17  Yes  Unk Pinto, MD  Omega-3 Fatty Acids (FISH OIL) 1000 MG CAPS Take 1 capsule by mouth daily.   Yes [provider]  SOOLANTRA 1 % CREA  11/13/16  Yes [provider]  spironolactone (ALDACTONE) 50 MG tablet Take 50 mg by mouth daily.   Yes [provider]  Sulfacetamide Sodium-Sulfur 8-4 % SUSP [place] FACE ONCE OR TWICE DAILY 11/28/16  Yes [provider]  vitamin E 400 UNIT capsule Take 400 Units by mouth daily.    Yes [provider]  azelastine (ASTELIN) 0.1 % nasal spray Place 2 sprays into both nostrils 2 (two) times daily. Use  in each nostril as directed 02/24/16 02/23/17  Forcucci, Loma Sousa, PA-C   Allergies  Allergen Reactions  . Food     strawberries      Objective:   Physical Exam  Constitutional: She is oriented to person, place, and time. She appears well-developed and well-nourished.  Cardiovascular: Normal rate, regular rhythm, normal heart sounds and intact distal pulses.  Pulses:      Radial pulses are 2+ on the right side, and 2+ on the left side.  Pulmonary/Chest: Effort normal and breath sounds normal. No respiratory distress. She has no wheezes. She exhibits no tenderness.  Musculoskeletal: She exhibits no edema.       Right wrist: Normal. She exhibits normal range of motion and no tenderness.       Left wrist: She exhibits tenderness and swelling (mild swelling distal to radius). She exhibits normal range of motion.       Right hand: Normal. She exhibits normal range of motion and no tenderness.       Left hand: She exhibits decreased range of motion, tenderness and swelling. Normal sensation noted. Normal strength noted.       Hands: Lymphadenopathy:    She has no cervical adenopathy.  Neurological: She is alert and oriented to person, place, and time.  Psychiatric: She has a normal mood and affect. Her behavior is normal.      Vitals:   04/04/17 1028  BP: 108/80  Pulse: 100  Resp: 18  Temp: 98.7 F (37.1 C)  SpO2: 98%   Dg Wrist 2 Views Left  Result Date: 04/04/2017 CLINICAL DATA:  Patient ports falling on the hand yesterday and has persistent pain swelling and difficulty with movement. EXAM: LEFT WRIST - 2 VIEW COMPARISON:  Left thumb series of today's date FINDINGS: The bones of the wrist are subjectively adequately mineralized. There is no acute fracture or dislocation. The joint spaces are well maintained. The overlying soft tissues exhibit mild swelling. IMPRESSION: There is no acute bony abnormality of the left wrist. Electronically Signed   By: David  Martinique M.D.   On:  04/04/2017 11:24   Dg Finger Thumb Left  Result Date: 04/04/2017 CLINICAL DATA:  Left thumb pains status post fall yesterday with tenderness and swelling and difficulty with movements. EXAM: LEFT THUMB 2+V COMPARISON:  Left wrist series of today's date FINDINGS: The bones are subjectively adequately mineralized. There mild narrowing of the interphalangeal and MCP joints. The [facility] joint spaces reasonably well-maintained. There is no acute phalangeal or first metacarpal fracture. There is no more than minimal soft tissue swelling. IMPRESSION: No definite acute fracture of the left thumb. There is degenerative narrowing of the IP joint space and of the first MCP joint space. Electronically Signed   By: David  Martinique M.D.   On: 04/04/2017 11:25   Assessment & Plan:  1. Pain of left  thumb 2. Wrist pain, acute, left X-ray shows no definite acute fracture. Thumb spica splint placed. Remove for bathing. Continue using ice on the area and taking Meloxicam. If pain persists in 2 weeks, or has not improved in 1 week, need re-evaluation and likely repeat x-rays.   - DG Finger Thumb Left; Future - DG Wrist 2 Views Left; Future - Thumb spica  Follow up if symptoms worsen or persist.   Noemi Chapel, PA-S

## 2017-04-07 ENCOUNTER — Other Ambulatory Visit: Payer: Self-pay | Admitting: Internal Medicine

## 2017-04-07 MED ORDER — AZELASTINE HCL 0.1 % NA SOLN: 2.0000 | Freq: Two times a day (BID) | NASAL | 3 refills | Status: DC

## 2017-04-18 ENCOUNTER — Other Ambulatory Visit: Payer: Self-pay | Admitting: Internal Medicine

## 2017-05-20 ENCOUNTER — Other Ambulatory Visit: Payer: Self-pay | Admitting: Internal Medicine

## 2017-06-05 ENCOUNTER — Other Ambulatory Visit: Payer: Self-pay | Admitting: Internal Medicine

## 2017-06-05 DIAGNOSIS — E782 Mixed hyperlipidemia: Secondary | ICD-10-CM

## 2017-06-12 ENCOUNTER — Other Ambulatory Visit: Payer: Self-pay | Admitting: Physician Assistant

## 2017-06-21 ENCOUNTER — Ambulatory Visit: Payer: Self-pay | Admitting: Adult Health

## 2017-06-21 ENCOUNTER — Ambulatory Visit: Payer: Self-pay | Admitting: Internal Medicine

## 2017-07-05 ENCOUNTER — Ambulatory Visit: Payer: Self-pay | Admitting: Adult Health

## 2017-07-24 DIAGNOSIS — Z6828 Body mass index (BMI) 28.0-28.9, adult: Secondary | ICD-10-CM | POA: Insufficient documentation

## 2017-07-24 DIAGNOSIS — E663 Overweight: Secondary | ICD-10-CM | POA: Insufficient documentation

## 2017-07-24 NOTE — Progress Notes (Signed)
FOLLOW UP  Assessment and Plan:   Athma Well controlled with current medications  Continue inhalers Avoid triggers  Cholesterol Currently at goal; continue medications Continue low cholesterol diet and exercise.  Check lipid panel.   Other abnormal glucose Recent A1Cs at goal Discussed diet/exercise, weight management  Defer A1C; check BMP  BMI 25 Continue to recommend diet heavy in fruits and veggies and low in animal meats, cheeses, and dairy products, appropriate calorie intake Discuss exercise recommendations routinely Continue to monitor weight at each visit  Vitamin D Def Below goal at last visit; continue supplementation to maintain goal of 70-100 Check Vit D level  Continue diet and meds as discussed. Further disposition pending results of labs. Discussed med's effects and SE's.   Over 30 minutes of exam, counseling, chart review, and critical decision making was performed.   Future Appointments  Date Time Provider [place]  02/04/2018 10:00 AM Unk Pinto, MD [facility] None    ----------------------------------------------------------------------------------------------------------------------  HPI 47 y.o. female  presents for 3 month follow up on asthma, cholesterol, glucose management, weight and vitamin D deficiency. She has IBS treated by linzess+ benefiber. She is doing very well. Asthma treated by Adair Patter, doing well without flares.    BMI is Body mass index is 25.86 kg/m., she has been working on diet and exercise. Wt Readings from Last 3 Encounters:  07/25/17 146 lb (66.2 kg)  04/04/17 148 lb (67.1 kg)  03/23/17 145 lb (65.8 kg)   Today their BP is BP: 116/88  She does workout. She denies chest pain, shortness of breath, dizziness.   She is on cholesterol medication (zetia, lopid) and denies myalgias. Her cholesterol is at goal. The cholesterol last visit was:   Lab Results  Component Value Date   CHOL 170 03/23/2017   HDL  52 03/23/2017   LDLCALC 98 03/23/2017   TRIG 100 03/23/2017   CHOLHDL 3.3 03/23/2017    She has been working on diet and exercise for glucose managmenet, and denies foot ulcerations, increased appetite, nausea, paresthesia of the feet, polydipsia, polyuria, visual disturbances, vomiting and weight loss. Last A1C in the office was:  Lab Results  Component Value Date   HGBA1C 5.2 12/28/2016   Patient is on Vitamin D supplement.   Lab Results  Component Value Date   VD25OH 36 12/28/2016        Current Medications:  Current Outpatient Medications on File Prior to Visit  Medication Sig  . albuterol (PROVENTIL HFA;VENTOLIN HFA) 108 (90 Base) MCG/ACT inhaler Inhale 2 puffs into the lungs every 2 (two) hours as needed for wheezing or shortness of breath (cough).  Marland Kitchen aspirin 81 MG tablet Take 81 mg by mouth See admin instructions. Takes 1 tablet on mondays, wednesdays, and fridays  . azelastine (ASTELIN) 0.1 % nasal spray Place 2 sprays into both nostrils 2 (two) times daily. Use in each nostril as directed  . BREO ELLIPTA 200-25 MCG/INH AEPB INHALE 1 PUFF BY MOUTH ONCE DAILY  . cholecalciferol (VITAMIN D) 1000 UNITS tablet Take 5,000 Units by mouth daily.   Marland Kitchen DOXYCYCLINE PO Take 150 mg by mouth daily.  Marland Kitchen ezetimibe (ZETIA) 10 MG tablet TAKE 1 TABLET BY MOUTH ONCE DAILY FOR  CHOLESTEROL  . famotidine (PEPCID) 20 MG tablet Take 20 mg by mouth 2 (two) times daily.   Marland Kitchen FLUARIX QUADRIVALENT 0.5 ML injection   . gemfibrozil (LOPID) 600 MG tablet TAKE ONE TABLET BY MOUTH TWICE DAILY FOR CHOLESTEROL  . guaiFENesin (MUCINEX) 600 MG  12 hr tablet Take 600 mg by mouth 2 (two) times daily.   Marland Kitchen LINZESS 72 MCG capsule TAKE 1 CAPSULE BY MOUTH ONCE DAILY BEFORE BREAKFAST  . Magnesium 400 MG CAPS Take 400 mg by mouth daily.  . meloxicam (MOBIC) 15 MG tablet TAKE ONE TABLET BY MOUTH ONCE DAILY AS NEEDED  . Olopatadine HCl 0.2 % SOLN Use 1-2 drops per eye daily (Patient taking differently: as needed. Use 1-2  drops per eye daily)  . Omega-3 Fatty Acids (FISH OIL) 1000 MG CAPS Take 1 capsule by mouth daily.  . SOOLANTRA 1 % CREA   . spironolactone (ALDACTONE) 50 MG tablet Take 50 mg by mouth daily.  . Sulfacetamide Sodium-Sulfur 8-4 % SUSP [place] FACE ONCE OR TWICE DAILY  . vitamin E 400 UNIT capsule Take 400 Units by mouth daily.   Marland Kitchen doxycycline (DORYX) 150 MG EC tablet Take 150 mg by mouth daily. with food   No current facility-administered medications on file prior to visit.      Allergies:  Allergies  Allergen Reactions  . Food     strawberries     Medical History:  Past Medical History:  Diagnosis Date  . Acne   . Arthritis   . Asthma   . Chronic kidney disease   . Difficulty sleeping    due to discomfort  . Elevated cholesterol   . GERD (gastroesophageal reflux disease)   . HLD (hyperlipidemia)   . IBS (irritable bowel syndrome)   . Nausea    Family history- Reviewed and unchanged Social history- Reviewed and unchanged   Review of Systems:  Review of Systems  Constitutional: Negative for malaise/fatigue and weight loss.  HENT: Negative for hearing loss and tinnitus.   Eyes: Negative for blurred vision and double vision.  Respiratory: Negative for cough, shortness of breath and wheezing.   Cardiovascular: Negative for chest pain, palpitations, orthopnea, claudication and leg swelling.  Gastrointestinal: Negative for abdominal pain, blood in stool, constipation, diarrhea, heartburn, melena, nausea and vomiting.  Genitourinary: Negative.   Musculoskeletal: Negative for joint pain and myalgias.  Skin: Negative for rash.  Neurological: Negative for dizziness, tingling, sensory change, weakness and headaches.  Endo/Heme/Allergies: Negative for polydipsia.  Psychiatric/Behavioral: Negative.   All other systems reviewed and are negative.     Physical Exam: BP 116/88   Pulse 70   Temp (!) 97.5 F (36.4 C)   Ht 5\' 3"  (1.6 m)   Wt 146 lb (66.2 kg)   SpO2 99%   BMI  25.86 kg/m  Wt Readings from Last 3 Encounters:  07/25/17 146 lb (66.2 kg)  04/04/17 148 lb (67.1 kg)  03/23/17 145 lb (65.8 kg)   General Appearance: Well nourished, in no apparent distress. Eyes: PERRLA, EOMs, conjunctiva no swelling or erythema Sinuses: No Frontal/maxillary tenderness ENT/Mouth: Ext aud canals clear, TMs without erythema, bulging. No erythema, swelling, or exudate on post pharynx.  Tonsils not swollen or erythematous. Hearing normal.  Neck: Supple, thyroid normal.  Respiratory: Respiratory effort normal, BS equal bilaterally without rales, rhonchi, wheezing or stridor.  Cardio: RRR with no MRGs. Brisk peripheral pulses without edema.  Abdomen: Soft, + BS.  Non tender, no guarding, rebound, hernias, masses. Lymphatics: Non tender without lymphadenopathy.  Musculoskeletal: Full ROM, 5/5 strength, Normal gait Skin: Warm, dry without rashes, lesions, ecchymosis.  Neuro: Cranial nerves intact. No cerebellar symptoms.  Psych: Awake and oriented X 3, normal affect, Insight and Judgment appropriate.    Izora Ribas, NP 10:52 AM [facility]  [facility]  

## 2017-07-25 ENCOUNTER — Encounter: Payer: Self-pay | Admitting: Adult Health

## 2017-07-25 ENCOUNTER — Ambulatory Visit (INDEPENDENT_AMBULATORY_CARE_PROVIDER_SITE_OTHER): Payer: BLUE CROSS/BLUE SHIELD | Admitting: Adult Health

## 2017-07-25 VITALS — BP 116/88 | HR 70 | Temp 97.5°F | Ht 63.0 in | Wt 146.0 lb

## 2017-07-25 DIAGNOSIS — R7309 Other abnormal glucose: Secondary | ICD-10-CM

## 2017-07-25 DIAGNOSIS — E663 Overweight: Secondary | ICD-10-CM

## 2017-07-25 DIAGNOSIS — Z79899 Other long term (current) drug therapy: Secondary | ICD-10-CM | POA: Diagnosis not present

## 2017-07-25 DIAGNOSIS — E785 Hyperlipidemia, unspecified: Secondary | ICD-10-CM

## 2017-07-25 DIAGNOSIS — E559 Vitamin D deficiency, unspecified: Secondary | ICD-10-CM

## 2017-07-25 DIAGNOSIS — E538 Deficiency of other specified B group vitamins: Secondary | ICD-10-CM | POA: Diagnosis not present

## 2017-07-25 DIAGNOSIS — J45909 Unspecified asthma, uncomplicated: Secondary | ICD-10-CM

## 2017-07-25 NOTE — Patient Instructions (Signed)
Aim for 7+ servings of fruits and vegetables daily  80+ fluid ounces of water or unsweet tea for healthy kidneys  Limit alcohol intake  Limit animal fats in diet for cholesterol and heart health - choose grass fed whenever available  Aim for low stress - take time to unwind and care for your mental health  Aim for 150 min of moderate intensity exercise weekly for heart health, and weights twice weekly for bone health  Aim for 7-9 hours of sleep daily      When it comes to diets, agreement about the perfect plan isn't easy to find, even among the experts. Experts at the [employer] developed an idea known as the Healthy Eating Plate. Just imagine a plate divided into logical, healthy portions.  The emphasis is on diet quality:  Load up on vegetables and fruits - one-half of your plate: Aim for color and variety, and remember that potatoes don't count.  Go for whole grains - one-quarter of your plate: Whole wheat, barley, wheat berries, quinoa, oats, brown rice, and foods made with them. If you want pasta, go with whole wheat pasta.  Protein power - one-quarter of your plate: Fish, chicken, beans, and nuts are all healthy, versatile protein sources. Limit red meat.  The diet, however, does go beyond the plate, offering a few other suggestions.  Use healthy plant oils, such as olive, canola, soy, corn, sunflower and peanut. Check the labels, and avoid partially hydrogenated oil, which have unhealthy trans fats.  If you're thirsty, drink water. Coffee and tea are good in moderation, but skip sugary drinks and limit milk and dairy products to one or two daily servings.  The type of carbohydrate in the diet is more important than the amount. Some sources of carbohydrates, such as vegetables, fruits, whole grains, and beans-are healthier than others.  Finally, stay active.     Intermittent fasting is more about strategy than starvation. It's meant to reset your  body in different ways, hopefully with fitness and nutrition changes as a result.  Like any big switchover, though, results may vary when it comes down to the individual level. What works for your friends may not work for you, or vice versa. That's why it's helpful to play around with variations on intermittent fasting and healthy habits and find what works best for you.  WHAT IS INTERMITTENT FASTING AND WHY DO IT?  Intermittent fasting doesn't involve specific foods, but rather, a strict schedule regarding when you eat. Also called "time-restricted eating," the tactic has been praised for its contribution to weight loss, improved body composition, and decreased cravings. Preliminary research also suggests it may be beneficial for glucose tolerance, hormone regulation, better muscle mass and lower body fat.  Part of its appeal is the simplicity of the effort. Unlike some other trends, there's no calculations to intermittent fasting.  You simply eat within a certain block of time, usually a window of 8-10 hours. In the other big block of time - about 14-16 hours, including when you're asleep - you don't eat anything, not even snacks. You can drink water, coffee, tea or any other beverage that doesn't have calories.  For example, if you like having a late dinner, you might skip breakfast and have your first meal at noon and your last meal of the day at 8 p.m., and then not eat until noon again the next day.  IDEAS FOR GETTING STARTED  If you're new to the strategy, it  may be helpful to eat within the typical circadian rhythm and keep eating within daylight hours. This can be especially beneficial if you're looking at intermittent fasting for weight-loss goals.  So first try only eating between 12pm to 8pm.  Outside of this time you may have water, black coffee, and hot tea. You may not eat it drink anything that has carbs, sugars, OR artificial sugars like diet soda.   Like any major eating and  fitness shift, it can take time to find the perfect fit, so don't be afraid to experiment with different options - including ditching intermittent fasting altogether if it's simply not for you. But if it is, you may be surprised by some of the benefits that come along with the strategy.

## 2017-07-26 LAB — COMPLETE METABOLIC PANEL WITH GFR
AG Ratio: 2 (calc) (ref 1.0–2.5)
ALT: 13 U/L (ref 6–29)
AST: 13 U/L (ref 10–35)
Albumin: 4.6 g/dL (ref 3.6–5.1)
Alkaline phosphatase (APISO): 57 U/L (ref 33–115)
BILIRUBIN TOTAL: 0.7 mg/dL (ref 0.2–1.2)
BUN: 14 mg/dL (ref 7–25)
CHLORIDE: 104 mmol/L (ref 98–110)
CO2: 25 mmol/L (ref 20–32)
CREATININE: 0.61 mg/dL (ref 0.50–1.10)
Calcium: 9.5 mg/dL (ref 8.6–10.2)
GFR, Est African American: 126 mL/min/{1.73_m2} (ref >=60)
GFR, Est Non African American: 109 mL/min/{1.73_m2} (ref >=60)
GLUCOSE: 84 mg/dL (ref 65–99)
Globulin: 2.3 g/dL (calc) (ref 1.9–3.7)
Potassium: 4.7 mmol/L (ref 3.5–5.3)
Sodium: 139 mmol/L (ref 135–146)
Total Protein: 6.9 g/dL (ref 6.1–8.1)

## 2017-07-26 LAB — CBC WITH DIFFERENTIAL/PLATELET
BASOS ABS: 30 {cells}/uL (ref 0–200)
BASOS PCT: 0.4 %
EOS ABS: 111 {cells}/uL (ref 15–500)
Eosinophils Relative: 1.5 %
HCT: 43.4 % (ref 35.0–45.0)
HEMOGLOBIN: 14.8 g/dL (ref 11.7–15.5)
Lymphs Abs: 1406 cells/uL (ref 850–3900)
MCH: 28.8 pg (ref 27.0–33.0)
MCHC: 34.1 g/dL (ref 32.0–36.0)
MCV: 84.4 fL (ref 80.0–100.0)
MONOS PCT: 6.1 %
MPV: 10.3 fL (ref 7.5–12.5)
NEUTROS ABS: 5402 {cells}/uL (ref 1500–7800)
Neutrophils Relative %: 73 %
PLATELETS: 278 10*3/uL (ref 140–400)
RBC: 5.14 10*6/uL — ABNORMAL HIGH (ref 3.80–5.10)
RDW: 12.1 % (ref 11.0–15.0)
Total Lymphocyte: 19 %
WBC mixed population: 451 cells/uL (ref 200–950)
WBC: 7.4 10*3/uL (ref 3.8–10.8)

## 2017-07-26 LAB — VITAMIN B12: VITAMIN B 12: 916 pg/mL (ref 200–1100)

## 2017-07-26 LAB — LIPID PANEL
CHOL/HDL RATIO: 3.5 (calc) (ref <5.0)
CHOLESTEROL: 170 mg/dL (ref <200)
HDL: 49 mg/dL — ABNORMAL LOW (ref >50)
LDL CHOLESTEROL (CALC): 103 mg/dL — AB
Non-HDL Cholesterol (Calc): 121 mg/dL (calc) (ref <130)
TRIGLYCERIDES: 87 mg/dL (ref <150)

## 2017-07-26 LAB — VITAMIN D 25 HYDROXY (VIT D DEFICIENCY, FRACTURES): VIT D 25 HYDROXY: 59 ng/mL (ref 30–100)

## 2017-07-26 LAB — TSH: TSH: 1.02 m[IU]/L

## 2017-11-30 ENCOUNTER — Other Ambulatory Visit: Payer: Self-pay | Admitting: Internal Medicine

## 2017-11-30 MED ORDER — BISOPROLOL-HYDROCHLOROTHIAZIDE 5-6.25 MG PO TABS: ORAL_TABLET | ORAL | 1 refills | Status: DC

## 2017-12-10 ENCOUNTER — Other Ambulatory Visit: Payer: Self-pay | Admitting: Internal Medicine

## 2017-12-15 ENCOUNTER — Other Ambulatory Visit: Payer: Self-pay | Admitting: Internal Medicine

## 2017-12-15 DIAGNOSIS — E782 Mixed hyperlipidemia: Secondary | ICD-10-CM

## 2017-12-31 ENCOUNTER — Other Ambulatory Visit: Payer: Self-pay | Admitting: Adult Health

## 2017-12-31 ENCOUNTER — Other Ambulatory Visit: Payer: Self-pay | Admitting: Internal Medicine

## 2017-12-31 DIAGNOSIS — J069 Acute upper respiratory infection, unspecified: Secondary | ICD-10-CM

## 2017-12-31 DIAGNOSIS — J4541 Moderate persistent asthma with (acute) exacerbation: Secondary | ICD-10-CM

## 2017-12-31 MED ORDER — ALBUTEROL SULFATE HFA 108 (90 BASE) MCG/ACT IN AERS: INHALATION_SPRAY | RESPIRATORY_TRACT | 3 refills | Status: DC

## 2017-12-31 MED ORDER — MONTELUKAST SODIUM 10 MG PO TABS: ORAL_TABLET | ORAL | 3 refills | Status: DC

## 2017-12-31 MED ORDER — PREDNISONE 20 MG PO TABS: ORAL_TABLET | ORAL | 0 refills | Status: DC

## 2018-01-14 ENCOUNTER — Other Ambulatory Visit: Payer: Self-pay | Admitting: Internal Medicine

## 2018-01-31 DIAGNOSIS — K219 Gastro-esophageal reflux disease without esophagitis: Secondary | ICD-10-CM | POA: Insufficient documentation

## 2018-01-31 NOTE — Progress Notes (Signed)
Complete Physical  Assessment and Plan:  [name] was seen today for annual exam.  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, monitor  Renal stone None recent, monitor  Vitamin D deficiency -     VITAMIN D 25 Hydroxy (Vit-D Deficiency, Fractures)  Overweight (BMI 25.0-29.9)  Other abnormal glucose -     COMPLETE METABOLIC PANEL WITH GFR -     Hemoglobin A1c -     Urinalysis, Routine w reflex microscopic -     Microalbumin / creatinine urine ratio  Medication management -     CBC with Differential/Platelet -     COMPLETE METABOLIC PANEL WITH GFR -     Magnesium -     Urinalysis, Routine w reflex microscopic  Hyperlipidemia, unspecified hyperlipidemia type -     Lipid panel -     TSH  Gastroesophageal reflux disease, esophagitis presence not specified -     Magnesium  Screening for cardiovascular condition -     EKG 12-Lead  Anemia, unspecified type -     CBC with Differential/Platelet -     Vitamin B12  Lateral epicondylitis Right lateral epicondylitis Injection- area cleaned with alcohol, Dexamethasone 10mg  and 1 CC lidocaine injected into lateral epicondyle, tolerated well with immediate relief.  Start NSAIDS or prednisone, RICE, and exercise given, can get brace If not better with refer to orthopedics.    Discussed med's effects and SE's. Screening labs and tests as requested with regular follow-up as recommended. Over 40 minutes of exam, counseling, chart review, and complex, high level critical decision making was performed this visit.   Future Appointments  Date Time Provider [place]  02/11/2019  9:00 AM  Liane Comber, NP [facility] None     HPI  47 y.o. female  presents for a complete physical and follow up for has Asthma; IBS (irritable bowel syndrome); Hyperlipidemia; Renal stone; Gall bladder disease; Other abnormal glucose; Medication management; Vitamin D deficiency; Overweight (BMI 25.0-29.9); and GERD (gastroesophageal reflux disease) on their problem list.   She is a Software engineer at [employer], married, 2 kids, eldest daughter just started college. Has been busy due to MIL dementia and supporting her as still wants to live by herself.   She also reports lateral elbow pain, tenderness of epicondyle; she has been wearing support band at work, applying ice, takes mobic daily.    She reports that she does occasionally get cystic acne lesions. She is also prescribed sulfa/sulfer face wash and spironolactone 50 mg and reports significant improvement. Followed by derm.   She has IBS-C with symptoms stabilized on linzess 72 mcg.  She has no family history of colon cancer.  She does have a first degree relative with breast cancer.    BMI is Body mass index is 24.43 kg/m., she has been working on diet and exercise though limited due to [employer] [facility], dementia. She is doing intermittent fasting and feels this has helped significantly. She drinks minimum 1 L water daily.  Wt Readings from Last 3 Encounters:  02/04/18 139 lb (63 kg)  07/25/17 146 lb (66.2 kg)  04/04/17 148 lb (67.1 kg)   Her blood pressure has been controlled at home, today their BP is BP: 100/68 She does workout. She denies chest  pain, shortness of breath, dizziness.   She is on cholesterol medication (gemfibrozil, omega 3) and denies myalgias. Her cholesterol is not at goal. The cholesterol last visit was:   Lab Results  Component Value Date   CHOL 170 07/25/2017   HDL 49 (L) 07/25/2017   LDLCALC 103 (H) 07/25/2017   TRIG 87 07/25/2017   CHOLHDL 3.5 07/25/2017   She has been working on diet and exercise for glucose  management, and denies increased appetite, nausea, paresthesia of the feet, polydipsia, polyuria, visual disturbances and vomiting. Last A1C in the office was:  Lab Results  Component Value Date   HGBA1C 5.2 12/28/2016   Last GFR: Lab Results  Component Value Date   GFRNONAA 109 07/25/2017   Patient is on Vitamin D supplement.   Lab Results  Component Value Date   VD25OH 18 07/25/2017      Current Medications:  Current Outpatient Medications on File Prior to Visit  Medication Sig Dispense Refill  . albuterol (PROVENTIL HFA;VENTOLIN HFA) 108 (90 Base) MCG/ACT inhaler Use 2 inhalations 15 minutes apart every 4 hrs as needed to rescue Asthma 3 Inhaler 3  . aspirin 81 MG tablet Take 81 mg by mouth See admin instructions. Takes 1 tablet on mondays, wednesdays, and fridays    . azelastine (ASTELIN) 0.1 % nasal spray Place 2 sprays into both nostrils 2 (two) times daily. Use in each nostril as directed 90 mL 3  . bisoprolol-hydrochlorothiazide (ZIAC) 5-6.25 MG tablet Take 1/2 to 1 tablet daily for BP & Migraine Prophylaxis 90 tablet 1  . BREO ELLIPTA 200-25 MCG/INH AEPB INHALE 1 PUFF BY MOUTH ONCE DAILY 180 each 1  . cholecalciferol (VITAMIN D) 1000 UNITS tablet Take 5,000 Units by mouth daily.     Marland Kitchen ezetimibe (ZETIA) 10 MG tablet TAKE 1 TABLET BY MOUTH ONCE DAILY FOR CHOLESTEROL 90 tablet 3  . famotidine (PEPCID) 20 MG tablet Take 20 mg by mouth 2 (two) times daily.     Marland Kitchen FLUARIX QUADRIVALENT 0.5 ML injection     . gemfibrozil (LOPID) 600 MG tablet TAKE ONE TABLET BY MOUTH TWICE DAILY FOR CHOLESTEROL 180 tablet 3  . guaiFENesin (MUCINEX) 600 MG 12 hr tablet Take 600 mg by mouth 2 (two) times daily.     Marland Kitchen LINZESS 72 MCG capsule TAKE 1 CAPSULE BY MOUTH ONCE DAILY BEFORE BREAKFAST 90 capsule 1  . Magnesium 400 MG CAPS Take 400 mg by mouth daily.    . meloxicam (MOBIC) 15 MG tablet TAKE 1 TABLET BY MOUTH ONCE DAILY AS NEEDED 90 tablet 3  . Olopatadine HCl 0.2 % SOLN Use 1-2 drops per eye daily  (Patient taking differently: as needed. Use 1-2 drops per eye daily) 2.5 mL 3  . Omega-3 Fatty Acids (FISH OIL) 1000 MG CAPS Take 1 capsule by mouth daily.    . SOOLANTRA 1 % CREA     . spironolactone (ALDACTONE) 50 MG tablet Take 50 mg by mouth daily.    . Sulfacetamide Sodium-Sulfur 8-4 % SUSP [place] FACE ONCE OR TWICE DAILY  3  . vitamin E 400 UNIT capsule Take 400 Units by mouth daily.     Marland Kitchen doxycycline (DORYX) 150 MG EC tablet Take 150 mg by mouth daily. with food  3  . DOXYCYCLINE PO Take 150 mg by mouth daily.    . predniSONE (DELTASONE) 20 MG tablet 1 tab 3 x day for 3 days, then 1 tab 2 x day for 3 days, then 1 tab 1  x day for 5 days 20 tablet 0   No current facility-administered medications on file prior to visit.    Allergies:  Allergies  Allergen Reactions  . Food     strawberries   Medical History:  She has Asthma; IBS (irritable bowel syndrome); Hyperlipidemia; Renal stone; Gall bladder disease; Other abnormal glucose; Medication management; Vitamin D deficiency; Overweight (BMI 25.0-29.9); and GERD (gastroesophageal reflux disease) on their problem list. Health Maintenance:   Immunization History  Administered Date(s) Administered  . Hepatitis B 09/08/2015, 10/08/2015, 03/08/2016  . Influenza Split 12/05/2016, 11/29/2017  . Influenza-Unspecified 12/26/2013, 11/26/2014, 12/21/2015  . PPD Test 11/25/2013, 12/28/2016  . Tdap 09/17/2012    Tetanus: 2014 Flu vaccine: 11/2017  LMP: No LMP recorded. Patient has had a hysterectomy. Pap: 2017, [facility] q[facility]y [facility]: 10/2017 at [facility]  Colonoscopy: never EGD:-   Last Dental Exam: [facility], last 2019, goes q68m Last Eye Exam: Dr. Agapito Games, 2019, goes annually Last Derm: Dr. Magnus Sinning, 2019, does annual total body   Patient Care Team: Unk Pinto, MD as PCP - General (Internal Medicine) Pyrtle, Lajuan Lines, MD as Referring Physician (Gastroenterology) Jimmey Ralph, NP  (Inactive) as Nurse Practitioner (Urology) Everlene Farrier, MD as Consulting Physician (Obstetrics and Gynecology) Druscilla Brownie, MD as Consulting Physician (Dermatology) Inocencio Homes, DPM as Consulting Physician (Podiatry)  Surgical History:  She has a past surgical history that includes Ectopic pregnancy surgery (2005); Partial hysterectomy (2009); Esophagogastroduodenoscopy (06/13/2011); and Cholecystectomy (07/27/2011). Family History:  Herfamily history includes Breast cancer in her mother; Diabetes type II in her father; Hyperlipidemia in her mother and sister; Lymphoma in her mother; Migraines in her mother and sister; Ovarian cancer in her paternal grandmother; Stroke in her maternal grandmother. Social History:  She reports that she has never smoked. She has never used smokeless tobacco. She reports that she does not drink alcohol or use drugs.  Review of Systems: Review of Systems  Constitutional: Negative for malaise/fatigue and weight loss.  HENT: Negative for hearing loss and tinnitus.   Eyes: Negative for blurred vision and double vision.  Respiratory: Negative for cough, shortness of breath and wheezing.   Cardiovascular: Negative for chest pain, palpitations, orthopnea, claudication and leg swelling.  Gastrointestinal: Negative for abdominal pain, blood in stool, constipation, diarrhea, heartburn, melena, nausea and vomiting.  Genitourinary: Negative.   Musculoskeletal: Negative for joint pain and myalgias.  Skin: Negative for rash.  Neurological: Negative for dizziness, tingling, sensory change, weakness and headaches.  Endo/Heme/Allergies: Negative for polydipsia.  Psychiatric/Behavioral: Negative.   All other systems reviewed and are negative.   Physical Exam: Estimated body mass index is 24.43 kg/m as calculated from the following:   Height as of this encounter: 5' 3.25" (1.607 m).   Weight as of this encounter: 139 lb (63 kg). BP 100/68   Pulse 67   Temp 97.7  F (36.5 C)   Ht 5' 3.25" (1.607 m)   Wt 139 lb (63 kg)   SpO2 98%   BMI 24.43 kg/m  General Appearance: Well nourished, in no apparent distress.  Eyes: PERRLA, EOMs, conjunctiva no swelling or erythema, normal fundi and vessels.  Sinuses: No Frontal/maxillary tenderness  ENT/Mouth: Ext aud canals clear, normal light reflex with TMs without erythema, bulging. Good dentition. No erythema, swelling, or exudate on post pharynx. Tonsils not swollen or erythematous. Hearing normal.  Neck: Supple, thyroid normal. No bruits  Respiratory: Respiratory effort normal, BS equal bilaterally without rales, rhonchi, wheezing or stridor.  Cardio: RRR without murmurs, rubs or gallops. Brisk peripheral pulses without edema.  Chest: symmetric, with normal excursions and percussion.  Breasts: Symmetric, without lumps, nipple discharge, retractions.  Abdomen: Soft, nontender, no guarding, rebound, hernias, masses, or organomegaly.  Lymphatics: Non tender without lymphadenopathy.  Genitourinary:  Musculoskeletal: Full ROM all peripheral extremities,5/5 strength, and normal gait.  Skin: Warm, dry without rashes, lesions, ecchymosis. Neuro: Cranial nerves intact, reflexes equal bilaterally. Normal muscle tone, no cerebellar symptoms. Sensation intact.  Psych: Awake and oriented X 3, normal affect, Insight and Judgment appropriate.   EKG: WNL no ST changes.  Izora Ribas 9:17 AM [facility]

## 2018-02-04 ENCOUNTER — Encounter: Payer: Self-pay | Admitting: Adult Health

## 2018-02-04 ENCOUNTER — Encounter: Payer: Self-pay | Admitting: Internal Medicine

## 2018-02-04 ENCOUNTER — Ambulatory Visit (INDEPENDENT_AMBULATORY_CARE_PROVIDER_SITE_OTHER): Payer: BLUE CROSS/BLUE SHIELD | Admitting: Adult Health

## 2018-02-04 VITALS — BP 100/68 | HR 67 | Temp 97.7°F | Ht 63.25 in | Wt 139.0 lb

## 2018-02-04 DIAGNOSIS — Z136 Encounter for screening for cardiovascular disorders: Secondary | ICD-10-CM

## 2018-02-04 DIAGNOSIS — R6889 Other general symptoms and signs: Secondary | ICD-10-CM | POA: Diagnosis not present

## 2018-02-04 DIAGNOSIS — I10 Essential (primary) hypertension: Secondary | ICD-10-CM

## 2018-02-04 DIAGNOSIS — Z131 Encounter for screening for diabetes mellitus: Secondary | ICD-10-CM | POA: Diagnosis not present

## 2018-02-04 DIAGNOSIS — Z1389 Encounter for screening for other disorder: Secondary | ICD-10-CM | POA: Diagnosis not present

## 2018-02-04 DIAGNOSIS — K829 Disease of gallbladder, unspecified: Secondary | ICD-10-CM

## 2018-02-04 DIAGNOSIS — R7309 Other abnormal glucose: Secondary | ICD-10-CM

## 2018-02-04 DIAGNOSIS — Z13220 Encounter for screening for lipoid disorders: Secondary | ICD-10-CM

## 2018-02-04 DIAGNOSIS — Z130 Encounter for screening for diseases of the blood and blood-forming organs and certain disorders involving the immune mechanism: Secondary | ICD-10-CM

## 2018-02-04 DIAGNOSIS — D649 Anemia, unspecified: Secondary | ICD-10-CM

## 2018-02-04 DIAGNOSIS — Z1329 Encounter for screening for other suspected endocrine disorder: Secondary | ICD-10-CM

## 2018-02-04 DIAGNOSIS — E663 Overweight: Secondary | ICD-10-CM

## 2018-02-04 DIAGNOSIS — Z79899 Other long term (current) drug therapy: Secondary | ICD-10-CM | POA: Diagnosis not present

## 2018-02-04 DIAGNOSIS — E559 Vitamin D deficiency, unspecified: Secondary | ICD-10-CM

## 2018-02-04 DIAGNOSIS — K219 Gastro-esophageal reflux disease without esophagitis: Secondary | ICD-10-CM

## 2018-02-04 DIAGNOSIS — M7711 Lateral epicondylitis, right elbow: Secondary | ICD-10-CM | POA: Diagnosis not present

## 2018-02-04 DIAGNOSIS — E785 Hyperlipidemia, unspecified: Secondary | ICD-10-CM

## 2018-02-04 DIAGNOSIS — Z0001 Encounter for general adult medical examination with abnormal findings: Secondary | ICD-10-CM

## 2018-02-04 DIAGNOSIS — K589 Irritable bowel syndrome without diarrhea: Secondary | ICD-10-CM

## 2018-02-04 DIAGNOSIS — N200 Calculus of kidney: Secondary | ICD-10-CM

## 2018-02-04 DIAGNOSIS — J45909 Unspecified asthma, uncomplicated: Secondary | ICD-10-CM

## 2018-02-04 MED ORDER — DEXAMETHASONE SODIUM PHOSPHATE 10 MG/ML IJ SOLN
10.0000 mg | Freq: Once | INTRAMUSCULAR | Status: AC
  Administered 2018-02-04: 10 mg

## 2018-02-04 NOTE — Patient Instructions (Addendum)
[name] , Thank you for taking time to come for your Medicare Wellness Visit. I appreciate your ongoing commitment to your health goals. Please review the following plan we discussed and let me know if I can assist you in the future.   These are the goals we discussed: Goals    . DIET - INCREASE WATER INTAKE     65+ fluid ounces daily       This is a list of the screening recommended for you and due dates:  Health Maintenance  Topic Date Due  . HIV Screening  02/10/1986  . Pap Smear  08/11/2017  . Tetanus Vaccine  09/18/2022  . Flu Shot  Completed    Know what a healthy weight is for you (roughly BMI <25) and aim to maintain this  Aim for 7+ servings of fruits and vegetables daily  65-80+ fluid ounces of water or unsweet tea for healthy kidneys  Limit to max 1 drink of alcohol per day; avoid smoking/tobacco  Limit animal fats in diet for cholesterol and heart health - choose grass fed whenever available  Avoid highly processed foods, and foods high in saturated/trans fats  Aim for low stress - take time to unwind and care for your mental health  Aim for 150 min of moderate intensity exercise weekly for heart health, and weights twice weekly for bone health  Aim for 7-9 hours of sleep daily     Tennis Elbow Tennis elbow (lateral epicondylitis) is inflammation of the outer tendons of your forearm close to your elbow. Your tendons attach your muscles to your bones. The outer tendons of your forearm are used to extend your wrist, and they attach on the outside part of your elbow. Tennis elbow is often found in people who play tennis, but anyone may get the condition from repeatedly extending the wrist or turning the forearm. What are the causes? This condition is caused by repeatedly extending your wrist and using your hands. It can result from sports or work that requires repetitive forearm movements. Tennis elbow may also be caused by an injury. What increases the  risk? You have a higher risk of developing tennis elbow if you play tennis or another racquet sport. You also have a higher risk if you frequently use your hands for work. This condition is also more likely to develop in:  Musicians.  Carpenters, painters, and plumbers.  Cooks.  Cashiers.  People who work in [employer].  Architect workers.  Butchers.  People who use computers.  What are the signs or symptoms? Symptoms of this condition include:  Pain and tenderness in your forearm and the outer part of your elbow. You may only feel the pain when you use your arm, or you may feel it even when you are not using your arm.  A burning feeling that runs from your elbow through your arm.  Weak grip in your hands.  How is this diagnosed? This condition may be diagnosed by medical history and physical exam. You may also have other tests, including:  X-rays.  MRI.  How is this treated? Your health care provider will recommend lifestyle adjustments, such as resting and icing your arm. Treatment may also include:  Medicines for inflammation. This may include shots of cortisone if your pain continues.  Physical therapy. This may include massage or exercises.  An elbow brace.  Surgery may eventually be recommended if your pain does not go away with treatment. Follow these instructions at home: Activity  Rest your elbow and wrist as directed by your health care provider. Try to avoid any activities that caused the problem until your health care provider says that you can do them again.  If a physical therapist teaches you exercises, do all of them as directed.  If you lift an object, lift it with your palm facing upward. This lowers the stress on your elbow. Lifestyle  If your tennis elbow is caused by sports, check your equipment and make sure that: ? You are using it correctly. ? It is the best fit for you.  If your tennis elbow is caused by work, take breaks  frequently, if you are able. Talk with your manager about how to best perform tasks in a way that is safe. ? If your tennis elbow is caused by computer use, talk with your manager about any changes that can be made to your work environment. General instructions  If directed, apply ice to the painful area: ? Put ice in a plastic bag. ? Place a towel between your skin and the bag. ? Leave the ice on for 20 minutes, 2-3 times per day.  Take medicines only as directed by your health care provider.  If you were given a brace, wear it as directed by your health care provider.  Keep all follow-up visits as directed by your health care provider. This is important. Contact a health care provider if:  Your pain does not get better with treatment.  Your pain gets worse.  You have numbness or weakness in your forearm, hand, or fingers. This information is not intended to replace advice given to you by your health care provider. Make sure you discuss any questions you have with your health care provider. Document Released: 02/27/2005 Document Revised: 10/28/2015 Document Reviewed: 02/23/2014 Elsevier Interactive Patient Education  [employer].

## 2018-02-05 LAB — LIPID PANEL
CHOL/HDL RATIO: 3.3 (calc) (ref <5.0)
Cholesterol: 171 mg/dL (ref <200)
HDL: 52 mg/dL (ref >50)
LDL Cholesterol (Calc): 103 mg/dL (calc) — ABNORMAL HIGH
NON-HDL CHOLESTEROL (CALC): 119 mg/dL (ref <130)
TRIGLYCERIDES: 71 mg/dL (ref <150)

## 2018-02-05 LAB — URINALYSIS, ROUTINE W REFLEX MICROSCOPIC
BILIRUBIN URINE: NEGATIVE
Glucose, UA: NEGATIVE
Hgb urine dipstick: NEGATIVE
Ketones, ur: NEGATIVE
LEUKOCYTES UA: NEGATIVE
Nitrite: NEGATIVE
PH: 6.5 (ref 5.0–8.0)
PROTEIN: NEGATIVE
SPECIFIC GRAVITY, URINE: 1.007 (ref 1.001–1.03)

## 2018-02-05 LAB — CBC WITH DIFFERENTIAL/PLATELET
Basophils Absolute: 53 cells/uL (ref 0–200)
Basophils Relative: 0.7 %
EOS PCT: 0.9 %
Eosinophils Absolute: 68 cells/uL (ref 15–500)
HCT: 44.4 % (ref 35.0–45.0)
Hemoglobin: 15.2 g/dL (ref 11.7–15.5)
LYMPHS ABS: 1365 {cells}/uL (ref 850–3900)
MCH: 29.2 pg (ref 27.0–33.0)
MCHC: 34.2 g/dL (ref 32.0–36.0)
MCV: 85.2 fL (ref 80.0–100.0)
MONOS PCT: 5.3 %
MPV: 10.1 fL (ref 7.5–12.5)
NEUTROS ABS: 5618 {cells}/uL (ref 1500–7800)
Neutrophils Relative %: 74.9 %
PLATELETS: 326 10*3/uL (ref 140–400)
RBC: 5.21 10*6/uL — AB (ref 3.80–5.10)
RDW: 12.3 % (ref 11.0–15.0)
Total Lymphocyte: 18.2 %
WBC mixed population: 398 cells/uL (ref 200–950)
WBC: 7.5 10*3/uL (ref 3.8–10.8)

## 2018-02-05 LAB — COMPLETE METABOLIC PANEL WITH GFR
AG Ratio: 2.1 (calc) (ref 1.0–2.5)
ALBUMIN MSPROF: 4.6 g/dL (ref 3.6–5.1)
ALKALINE PHOSPHATASE (APISO): 55 U/L (ref 33–115)
ALT: 12 U/L (ref 6–29)
AST: 15 U/L (ref 10–35)
BILIRUBIN TOTAL: 0.6 mg/dL (ref 0.2–1.2)
BUN: 15 mg/dL (ref 7–25)
CHLORIDE: 104 mmol/L (ref 98–110)
CO2: 25 mmol/L (ref 20–32)
Calcium: 9.5 mg/dL (ref 8.6–10.2)
Creat: 0.78 mg/dL (ref 0.50–1.10)
GFR, Est African American: 106 mL/min/{1.73_m2} (ref >=60)
GFR, Est Non African American: 91 mL/min/{1.73_m2} (ref >=60)
GLOBULIN: 2.2 g/dL (ref 1.9–3.7)
GLUCOSE: 79 mg/dL (ref 65–99)
Potassium: 4.4 mmol/L (ref 3.5–5.3)
SODIUM: 136 mmol/L (ref 135–146)
Total Protein: 6.8 g/dL (ref 6.1–8.1)

## 2018-02-05 LAB — VITAMIN D 25 HYDROXY (VIT D DEFICIENCY, FRACTURES): VIT D 25 HYDROXY: 51 ng/mL (ref 30–100)

## 2018-02-05 LAB — MICROALBUMIN / CREATININE URINE RATIO
Creatinine, Urine: 40 mg/dL (ref 20–275)
MICROALB UR: 0.3 mg/dL
Microalb Creat Ratio: 8 mcg/mg creat (ref <30)

## 2018-02-05 LAB — VITAMIN B12: Vitamin B-12: 1389 pg/mL — ABNORMAL HIGH (ref 200–1100)

## 2018-02-05 LAB — HEMOGLOBIN A1C
HEMOGLOBIN A1C: 5.4 %{Hb} (ref <5.7)
Mean Plasma Glucose: 108 (calc)
eAG (mmol/L): 6 (calc)

## 2018-02-05 LAB — TSH: TSH: 1.31 mIU/L

## 2018-02-05 LAB — MAGNESIUM: MAGNESIUM: 2.4 mg/dL (ref 1.5–2.5)

## 2018-02-18 ENCOUNTER — Other Ambulatory Visit: Payer: Self-pay | Admitting: Internal Medicine

## 2018-03-14 ENCOUNTER — Encounter: Payer: Self-pay | Admitting: Adult Health

## 2018-03-14 ENCOUNTER — Ambulatory Visit (INDEPENDENT_AMBULATORY_CARE_PROVIDER_SITE_OTHER): Payer: BLUE CROSS/BLUE SHIELD | Admitting: Adult Health

## 2018-03-14 VITALS — BP 124/78 | HR 70 | Temp 97.9°F | Ht 63.25 in | Wt 142.6 lb

## 2018-03-14 DIAGNOSIS — J040 Acute laryngitis: Secondary | ICD-10-CM | POA: Diagnosis not present

## 2018-03-14 MED ORDER — PROMETHAZINE-DM 6.25-15 MG/5ML PO SYRP: 5.0000 mL | ORAL_SOLUTION | Freq: Four times a day (QID) | ORAL | 1 refills | Status: DC | PRN

## 2018-03-14 MED ORDER — PREDNISONE 20 MG PO TABS: ORAL_TABLET | ORAL | 0 refills | Status: DC

## 2018-03-14 MED ORDER — AZITHROMYCIN 250 MG PO TABS: ORAL_TABLET | ORAL | 1 refills | Status: AC

## 2018-03-14 NOTE — Patient Instructions (Signed)
Laryngitis    Laryngitis is inflammation of the vocal cords that causes symptoms such as hoarseness or loss of voice. The vocal cords are two bands of muscles in your throat. When you speak, these cords come together and vibrate. The vibrations come out through your mouth as sound. When your vocal cords are inflamed, your voice sounds different.  Laryngitis can be temporary (acute) or long-term (chronic). Most cases of acute laryngitis improve with time. Chronic laryngitis is laryngitis that lasts for more than 3 weeks.  What are the causes?  Acute laryngitis may be caused by:   A viral infection.   Lots of talking, yelling, or singing. This is also called vocal strain.   A bacterial infection.  Chronic laryngitis may be caused by:   Vocal strain.   Injury to your vocal cords.   Acid reflux (gastroesophageal reflux disease, or GERD).   Allergies.   A sinus infection.   Smoking.   Alcohol abuse.   Breathing in chemicals or dust.   Growths on the vocal cords.  What increases the risk?  The following factors may make you more likely to develop this condition:   Smoking.   Alcohol abuse.   Having allergies.   Chronic irritants in the workplace, such as toxic fumes.  What are the signs or symptoms?  Symptoms of this condition may include:   Low, hoarse voice.   Loss of voice.   Dry cough.   Sore or dry throat.   Stuffy nose.  How is this diagnosed?  This condition may be diagnosed based on:   Your symptoms and a physical exam.   Throat culture.   Blood test.   A procedure in which your health care provider looks at your vocal cords with a mirror or viewing tube (laryngoscopy).  How is this treated?  Treatment for laryngitis depends on what is causing it.   Usually, treatment involves resting your voice and using medicines to soothe your throat.   If your laryngitis is caused by a bacterial infection, you may need to take antibiotic medicine.   If your laryngitis is caused by a growth, you may  need to have a procedure to remove it.  Follow these instructions at home:  Medicines   Take over-the-counter and prescription medicines only as told by your health care provider.   If you were prescribed an antibiotic medicine, take it as told by your health care provider. Do not stop taking the antibiotic even if you start to feel better.  General instructions   Talk as little as possible. Also avoid whispering, which can cause vocal strain.   Write instead of talking. Do this until your voice is back to normal.   Drink enough fluid to keep your urine pale yellow.   Breathe in moist air. Use a humidifier if you live in a dry climate.   Do not use any products that contain nicotine or tobacco, such as cigarettes and e-cigarettes. If you need help quitting, ask your health care provider.  Contact a health care provider if:   You have a fever.   You have increasing pain.   Your symptoms do not get better in 2 weeks.  Get help right away if:   You cough up blood.   You have difficulty swallowing.   You have trouble breathing.  Summary   Laryngitis is inflammation of the vocal cords that causes symptoms such as hoarseness or loss of voice.   Laryngitis can be temporary (  acute) or long-term (chronic).   Treatment for laryngitis depends on the cause. It often involves resting your voice and using medicine to soothe your throat.  This information is not intended to replace advice given to you by your health care provider. Make sure you discuss any questions you have with your health care provider.  Document Released: 02/27/2005 Document Revised: 02/14/2017 Document Reviewed: 02/14/2017  Elsevier Interactive Patient Education  2019 Elsevier Inc[employer]

## 2018-03-14 NOTE — Progress Notes (Signed)
Assessment and Plan:  [name] was seen today for acute visit.  Diagnoses and all orders for this visit:  Acute laryngitis Discussed the importance of avoiding unnecessary antibiotic therapy, mild symptoms, likely viral, start abx only if symptoms not improving and lasting longer that 10 days, or fever worsening Suggested symptomatic OTC remedies. Nasal saline spray for congestion. Nasal steroids, allergy pill, oral steroids offered Follow up as needed. -     predniSONE (DELTASONE) 20 MG tablet; 1 tab 3 x day for 3 days, then 1 tab 2 x day for 3 days, then 1 tab 1 x day for 5 days       -     promethazine-dextromethorphan (PROMETHAZINE-DM) 6.25-15 MG/5ML syrup; Take 5 mLs by mouth 4 (four) times daily as needed for cough.  Other orders -     azithromycin (ZITHROMAX) 250 MG tablet; Take 2 tablets (500 mg) on  Day 1,  followed by 1 tablet (250 mg) once daily on Days 2 through 5.  Further disposition pending results of labs. Discussed med's effects and SE's.   Over 15 minutes of exam, counseling, chart review, and critical decision making was performed.   Future Appointments  Date Time Provider [place]  08/08/2018  9:30 AM Liane Comber, NP [facility] None  02/11/2019  9:00 AM Liane Comber, NP [facility] None    ------------------------------------------------------------------------------------------------------------------   HPI BP 124/78   Pulse 70   Temp 97.9 F (36.6 C)   Ht 5' 3.25" (1.607 m)   Wt 142 lb 9.6 oz (64.7 kg)   SpO2 98%   BMI 25.06 kg/m   47 y.o.female with hx of asthma, seasonal allergies, presents for evaluation of URI symptoms for 1 week; began with vaguely not feeling well, mild body aches x 1 day that quickly resolved, and has been running low grade fever in the AM. She reports she has had nasal congestion, post-nasal drip, bilateral ear pressure at night, hoarseness, in the AM feels upper teeth are aching. Fever has been 99 range. Has been  taking tylenol. She also endorses mild cough.   She has been taking allegra, breo, nasocort, mucinex DM.   Past Medical History:  Diagnosis Date  . Acne   . Arthritis   . Asthma   . Chronic kidney disease   . Difficulty sleeping    due to discomfort  . Elevated cholesterol   . GERD (gastroesophageal reflux disease)   . HLD (hyperlipidemia)   . IBS (irritable bowel syndrome)   . Nausea      Allergies  Allergen Reactions  . Food     strawberries    Current Outpatient Medications on File Prior to Visit  Medication Sig  . albuterol (PROVENTIL HFA;VENTOLIN HFA) 108 (90 Base) MCG/ACT inhaler Use 2 inhalations 15 minutes apart every 4 hrs as needed to rescue Asthma  . aspirin 81 MG tablet Take 81 mg by mouth See admin instructions. Takes 1 tablet on mondays, wednesdays, and fridays  . azelastine (ASTELIN) 0.1 % nasal spray Place 2 sprays into both nostrils 2 (two) times daily. Use in each nostril as directed  . bisoprolol-hydrochlorothiazide (ZIAC) 5-6.25 MG tablet Take 1/2 to 1 tablet daily for BP & Migraine Prophylaxis  . BREO ELLIPTA 200-25 MCG/INH AEPB INHALE 1 PUFF BY MOUTH ONCE DAILY  . cholecalciferol (VITAMIN D) 1000 UNITS tablet Take 5,000 Units by mouth daily.   Marland Kitchen doxycycline (DORYX) 150 MG EC tablet Take 150 mg by mouth daily. with food  . DOXYCYCLINE PO Take  150 mg by mouth daily.  Marland Kitchen ezetimibe (ZETIA) 10 MG tablet TAKE 1 TABLET BY MOUTH ONCE DAILY FOR CHOLESTEROL  . famotidine (PEPCID) 20 MG tablet Take 20 mg by mouth 2 (two) times daily.   Marland Kitchen FLUARIX QUADRIVALENT 0.5 ML injection   . gemfibrozil (LOPID) 600 MG tablet TAKE ONE TABLET BY MOUTH TWICE DAILY FOR CHOLESTEROL  . guaiFENesin (MUCINEX) 600 MG 12 hr tablet Take 600 mg by mouth 2 (two) times daily.   Marland Kitchen LINZESS 72 MCG capsule TAKE 1 CAPSULE BY MOUTH ONCE DAILY BEFORE BREAKFAST  . Magnesium 400 MG CAPS Take 400 mg by mouth daily.  . meloxicam (MOBIC) 15 MG tablet TAKE 1 TABLET BY MOUTH ONCE DAILY AS NEEDED  .  Olopatadine HCl 0.2 % SOLN Use 1-2 drops per eye daily (Patient taking differently: as needed. Use 1-2 drops per eye daily)  . Omega-3 Fatty Acids (FISH OIL) 1000 MG CAPS Take 1 capsule by mouth daily.  . predniSONE (DELTASONE) 20 MG tablet 1 tab 3 x day for 3 days, then 1 tab 2 x day for 3 days, then 1 tab 1 x day for 5 days  . SOOLANTRA 1 % CREA   . spironolactone (ALDACTONE) 50 MG tablet Take 50 mg by mouth daily.  . Sulfacetamide Sodium-Sulfur 8-4 % SUSP [place] FACE ONCE OR TWICE DAILY  . vitamin E 400 UNIT capsule Take 400 Units by mouth daily.    No current facility-administered medications on file prior to visit.     ROS: all negative except above.   Physical Exam:  BP 124/78   Pulse 70   Temp 97.9 F (36.6 C)   Ht 5' 3.25" (1.607 m)   Wt 142 lb 9.6 oz (64.7 kg)   SpO2 98%   BMI 25.06 kg/m   General Appearance: Well nourished, in no apparent distress. Eyes: PERRLA, conjunctiva no swelling or erythema Sinuses: Mild Frontal/maxillary fullness without tenderness ENT/Mouth: Ext aud canals clear, TMs without erythema, bulging. No erythema, swelling, or exudate on post pharynx.  Tonsils not swollen or erythematous. Hearing normal. Hoarse vocal quality Neck: Supple, thyroid normal.  Respiratory: Respiratory effort normal, BS equal bilaterally without rales, rhonchi, wheezing or stridor.  Cardio: RRR with no MRGs. Brisk peripheral pulses without edema.  Lymphatics: Non tender without lymphadenopathy.  Musculoskeletal: normal gait.  Skin: Warm, dry without rashes, lesions, ecchymosis.  Neuro: Cranial nerves intact. Normal muscle ton Psych: Awake and oriented X 3, normal affect, Insight and Judgment appropriate.     Izora Ribas, NP 10:40 AM [facility]

## 2018-03-26 ENCOUNTER — Other Ambulatory Visit: Payer: Self-pay | Admitting: Adult Health

## 2018-03-26 DIAGNOSIS — J040 Acute laryngitis: Secondary | ICD-10-CM

## 2018-03-26 MED ORDER — PREDNISONE 20 MG PO TABS: ORAL_TABLET | ORAL | 0 refills | Status: DC

## 2018-03-29 ENCOUNTER — Other Ambulatory Visit: Payer: Self-pay | Admitting: Internal Medicine

## 2018-03-29 MED ORDER — PREDNISONE 20 MG PO TABS: ORAL_TABLET | ORAL | 0 refills | Status: DC

## 2018-03-29 MED ORDER — LEVOFLOXACIN 500 MG PO TABS: ORAL_TABLET | ORAL | 1 refills | Status: DC

## 2018-06-11 ENCOUNTER — Other Ambulatory Visit: Payer: Self-pay | Admitting: Internal Medicine

## 2018-08-08 ENCOUNTER — Ambulatory Visit: Payer: Self-pay | Admitting: Adult Health

## 2018-08-19 ENCOUNTER — Other Ambulatory Visit: Payer: Self-pay | Admitting: Adult Health

## 2018-08-20 NOTE — Progress Notes (Signed)
FOLLOW UP  Assessment and Plan:   Athma Well controlled with current medications  Continue inhalers Avoid triggers  Cholesterol Currently near goal; continue zetia/lopid Hx of severe myalgias with statin Continue low cholesterol diet and exercise.  Check lipid panel.   Other abnormal glucose Recent A1Cs at goal Discussed diet/exercise, weight management  Defer A1C; check CMP  BMI 25 Continue to recommend diet heavy in fruits and veggies and low in animal meats, cheeses, and dairy products, appropriate calorie intake Discuss exercise recommendations routinely; cut back intermittent fasting to 3 days a week, carb cycling discussed, high protein high fiber mod healthy fat reviewed Continue to monitor weight at each visit  Vitamin D Def Below goal at last visit; she increased from 1000 IU to 2000 IU  continue supplementation to maintain goal of 60-100 Check Vit D level  History of lateral epicondylitis Currently with mild flare, some tendonitis, intermittent sx, Continue NSAID, ice, avoiding triggering motions, wear band while at work, follow up if progressive  Continue diet and meds as discussed. Further disposition pending results of labs. Discussed med's effects and SE's.   Over 30 minutes of exam, counseling, chart review, and critical decision making was performed.   Future Appointments  Date Time Provider [place]  02/11/2019  9:00 AM Liane Comber, NP [facility] None    ----------------------------------------------------------------------------------------------------------------------  HPI 48 y.o. female  presents for 3 month follow up on asthma, cholesterol, glucose management, weight and vitamin D deficiency.   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 Reports mild intermittent sx recently, reminded to restart band and limit  aggravating motions at work if possible.   Asthma treated by Adair Patter, doing well without flares.    She has IBS treated by linzess+ benefiber. She is doing very well.  she has a diagnosis of GERD which is currently managed by famotidine 20 mg BID she reports symptoms is currently well controlled, and denies breakthrough reflux, burning in chest, hoarseness or cough.    BMI is Body mass index is 25.83 kg/m., she has been working on diet, exercise has been less since shift change at work, trying to walk earlier in the day instead. Intermittent fasting was working initially but not recently.  Wt Readings from Last 3 Encounters:  08/21/18 147 lb (66.7 kg)  03/14/18 142 lb 9.6 oz (64.7 kg)  02/04/18 139 lb (63 kg)   Today their BP is BP: 110/68  She does workout. She denies chest pain, shortness of breath, dizziness.   She is on cholesterol medication (zetia, lopid) and denies myalgias. Hx of severe myalgias with pravastatin. Her cholesterol is at goal. The cholesterol last visit was:   Lab Results  Component Value Date   CHOL 171 02/04/2018   HDL 52 02/04/2018   LDLCALC 103 (H) 02/04/2018   TRIG 71 02/04/2018   CHOLHDL 3.3 02/04/2018     She has been working on diet and exercise for glucose managmenet, and denies foot ulcerations, increased appetite, nausea, paresthesia of the feet, polydipsia, polyuria, visual disturbances, vomiting and weight loss. Last A1C in the office was:  Lab Results  Component Value Date   HGBA1C 5.4 02/04/2018   Patient is on Vitamin D supplement Lab Results  Component Value Date   VD25OH 51 02/04/2018        Current Medications:  Current Outpatient Medications on File Prior to Visit  Medication  Sig  . albuterol (PROVENTIL HFA;VENTOLIN HFA) 108 (90 Base) MCG/ACT inhaler Use 2 inhalations 15 minutes apart every 4 hrs as needed to rescue Asthma  . aspirin 81 MG tablet Take 81 mg by mouth See admin instructions. Takes 1 tablet on mondays,  wednesdays, and fridays  . azelastine (ASTELIN) 0.1 % nasal spray Place 2 sprays into both nostrils 2 (two) times daily. Use in each nostril as directed (Patient taking differently: Place 2 sprays into both nostrils as needed. Use in each nostril as directed)  . bisoprolol-hydrochlorothiazide (ZIAC) 5-6.25 MG tablet Take 1/2 to 1 tablet daily for BP & Migraine Prophylaxis  . BREO ELLIPTA 200-25 MCG/INH AEPB INHALE 1 PUFF BY MOUTH ONCE DAILY  . cholecalciferol (VITAMIN D) 1000 UNITS tablet Take 2,000 Units by mouth daily.   Marland Kitchen ezetimibe (ZETIA) 10 MG tablet TAKE 1 TABLET BY MOUTH ONCE DAILY FOR CHOLESTEROL  . famotidine (PEPCID) 20 MG tablet Take 20 mg by mouth 2 (two) times daily.   Marland Kitchen FLUARIX QUADRIVALENT 0.5 ML injection   . gemfibrozil (LOPID) 600 MG tablet TAKE ONE TABLET BY MOUTH TWICE DAILY FOR CHOLESTEROL  . guaiFENesin (MUCINEX) 600 MG 12 hr tablet Take 600 mg by mouth as needed.   . Magnesium 400 MG CAPS Take 400 mg by mouth daily.  . meloxicam (MOBIC) 15 MG tablet TAKE 1 TABLET BY MOUTH ONCE DAILY AS NEEDED  . Olopatadine HCl 0.2 % SOLN Use 1-2 drops per eye daily (Patient taking differently: as needed. Use 1-2 drops per eye daily)  . Omega-3 Fatty Acids (FISH OIL) 1000 MG CAPS Take 1 capsule by mouth daily.  . SOOLANTRA 1 % CREA   . spironolactone (ALDACTONE) 50 MG tablet Take 50 mg by mouth daily.  . Sulfacetamide Sodium-Sulfur 8-4 % SUSP [place] FACE ONCE OR TWICE DAILY  . vitamin E 400 UNIT capsule Take 400 Units by mouth daily.   Marland Kitchen levofloxacin (LEVAQUIN) 500 MG tablet Take 1 tablet daily with food for infection  . LINZESS 72 MCG capsule TAKE 1 CAPSULE BY MOUTH ONCE DAILY BEFORE BREAKFAST   No current facility-administered medications on file prior to visit.      Allergies:  Allergies  Allergen Reactions  . Food     strawberries     Medical History:  Past Medical History:  Diagnosis Date  . Acne   . Arthritis   . Asthma   . Chronic kidney disease   . Difficulty  sleeping    due to discomfort  . Elevated cholesterol   . GERD (gastroesophageal reflux disease)   . HLD (hyperlipidemia)   . IBS (irritable bowel syndrome)   . Nausea    Family history- Reviewed and unchanged Social history- Reviewed and unchanged   Review of Systems:  Review of Systems  Constitutional: Negative for malaise/fatigue and weight loss.  HENT: Negative for hearing loss and tinnitus.   Eyes: Negative for blurred vision and double vision.  Respiratory: Negative for cough, shortness of breath and wheezing.   Cardiovascular: Negative for chest pain, palpitations, orthopnea, claudication and leg swelling.  Gastrointestinal: Negative for abdominal pain, blood in stool, constipation, diarrhea, heartburn, melena, nausea and vomiting.  Genitourinary: Negative.   Musculoskeletal: Negative for joint pain and myalgias.  Skin: Negative for rash.  Neurological: Negative for dizziness, tingling, sensory change, weakness and headaches.  Endo/Heme/Allergies: Negative for polydipsia.  Psychiatric/Behavioral: Negative.   All other systems reviewed and are negative.    Physical Exam: BP 110/68   Pulse 73  Temp (!) 97.5 F (36.4 C)   Ht 5' 3.25" (1.607 m)   Wt 147 lb (66.7 kg)   SpO2 98%   BMI 25.83 kg/m  Wt Readings from Last 3 Encounters:  08/21/18 147 lb (66.7 kg)  03/14/18 142 lb 9.6 oz (64.7 kg)  02/04/18 139 lb (63 kg)   General Appearance: Well nourished, in no apparent distress. Eyes: PERRLA, EOMs, conjunctiva no swelling or erythema Sinuses: No Frontal/maxillary tenderness ENT/Mouth: Ext aud canals clear, TMs without erythema, bulging. No erythema, swelling, or exudate on post pharynx.  Tonsils not swollen or erythematous. Hearing normal.  Neck: Supple, thyroid normal.  Respiratory: Respiratory effort normal, BS equal bilaterally without rales, rhonchi, wheezing or stridor.  Cardio: RRR with no MRGs. Brisk peripheral pulses without edema.  Abdomen: Soft, + BS.   Non tender, no guarding, rebound, hernias, masses. Lymphatics: Non tender without lymphadenopathy.  Musculoskeletal: Full ROM, 5/5 strength, Normal gait. Lateral epicondyle non tender, non-inflamed.  Skin: Warm, dry without rashes, lesions, ecchymosis.  Neuro: Cranial nerves intact. No cerebellar symptoms.  Psych: Awake and oriented X 3, normal affect, Insight and Judgment appropriate.    Izora Ribas, NP 11:23 AM [name]

## 2018-08-21 ENCOUNTER — Other Ambulatory Visit: Payer: Self-pay

## 2018-08-21 ENCOUNTER — Encounter: Payer: Self-pay | Admitting: Adult Health

## 2018-08-21 ENCOUNTER — Ambulatory Visit: Payer: BLUE CROSS/BLUE SHIELD | Admitting: Adult Health

## 2018-08-21 VITALS — BP 110/68 | HR 73 | Temp 97.5°F | Ht 63.25 in | Wt 147.0 lb

## 2018-08-21 DIAGNOSIS — E785 Hyperlipidemia, unspecified: Secondary | ICD-10-CM | POA: Diagnosis not present

## 2018-08-21 DIAGNOSIS — Z79899 Other long term (current) drug therapy: Secondary | ICD-10-CM

## 2018-08-21 DIAGNOSIS — K219 Gastro-esophageal reflux disease without esophagitis: Secondary | ICD-10-CM | POA: Diagnosis not present

## 2018-08-21 DIAGNOSIS — R7309 Other abnormal glucose: Secondary | ICD-10-CM

## 2018-08-21 DIAGNOSIS — E559 Vitamin D deficiency, unspecified: Secondary | ICD-10-CM

## 2018-08-21 DIAGNOSIS — J45909 Unspecified asthma, uncomplicated: Secondary | ICD-10-CM

## 2018-08-21 DIAGNOSIS — K589 Irritable bowel syndrome without diarrhea: Secondary | ICD-10-CM

## 2018-08-21 DIAGNOSIS — E663 Overweight: Secondary | ICD-10-CM | POA: Diagnosis not present

## 2018-08-21 DIAGNOSIS — Z8739 Personal history of other diseases of the musculoskeletal system and connective tissue: Secondary | ICD-10-CM | POA: Insufficient documentation

## 2018-08-21 NOTE — Patient Instructions (Addendum)
Goals    . DIET - INCREASE WATER INTAKE     65+ fluid ounces daily       Cut back on intermittent fasting to 3 days a week; try to introduce variability into diet - vary carb/protein ratios, aim for moderate amount of healthy fat intake, high protein, high fiber    Know what a healthy weight is for you (roughly BMI <25) and aim to maintain this  Aim for 7+ servings of fruits and vegetables daily  65-80+ fluid ounces of water or unsweet tea for healthy kidneys  Limit to max 1 drink of alcohol per day; avoid smoking/tobacco  Limit animal fats in diet for cholesterol and heart health - choose grass fed whenever available  Avoid highly processed foods, and foods high in saturated/trans fats  Aim for low stress - take time to unwind and care for your mental health  Aim for 150 min of moderate intensity exercise weekly for heart health, and weights twice weekly for bone health  Aim for 7-9 hours of sleep daily      When it comes to diets, agreement about the perfect plan isn't easy to find, even among the experts. Experts at the [employer] developed an idea known as the Healthy Eating Plate. Just imagine a plate divided into logical, healthy portions.  The emphasis is on diet quality:  Load up on vegetables and fruits - one-half of your plate: Aim for color and variety, and remember that potatoes don't count.  Go for whole grains - one-quarter of your plate: Whole wheat, barley, wheat berries, quinoa, oats, brown rice, and foods made with them. If you want pasta, go with whole wheat pasta.  Protein power - one-quarter of your plate: Fish, chicken, beans, and nuts are all healthy, versatile protein sources. Limit red meat.  The diet, however, does go beyond the plate, offering a few other suggestions.  Use healthy plant oils, such as olive, canola, soy, corn, sunflower and peanut. Check the labels, and avoid partially hydrogenated oil, which have unhealthy  trans fats.  If you're thirsty, drink water. Coffee and tea are good in moderation, but skip sugary drinks and limit milk and dairy products to one or two daily servings.  The type of carbohydrate in the diet is more important than the amount. Some sources of carbohydrates, such as vegetables, fruits, whole grains, and beans-are healthier than others.  Finally, stay active.

## 2018-08-22 LAB — COMPLETE METABOLIC PANEL WITH GFR
AG Ratio: 2.3 (calc) (ref 1.0–2.5)
ALT: 20 U/L (ref 6–29)
AST: 18 U/L (ref 10–35)
Albumin: 4.4 g/dL (ref 3.6–5.1)
Alkaline phosphatase (APISO): 55 U/L (ref 31–125)
BUN: 16 mg/dL (ref 7–25)
CO2: 26 mmol/L (ref 20–32)
Calcium: 9.2 mg/dL (ref 8.6–10.2)
Chloride: 103 mmol/L (ref 98–110)
Creat: 0.72 mg/dL (ref 0.50–1.10)
GFR, Est African American: 116 mL/min/{1.73_m2} (ref >=60)
GFR, Est Non African American: 100 mL/min/{1.73_m2} (ref >=60)
Globulin: 1.9 g/dL (calc) (ref 1.9–3.7)
Glucose, Bld: 82 mg/dL (ref 65–99)
Potassium: 4.2 mmol/L (ref 3.5–5.3)
Sodium: 137 mmol/L (ref 135–146)
Total Bilirubin: 1 mg/dL (ref 0.2–1.2)
Total Protein: 6.3 g/dL (ref 6.1–8.1)

## 2018-08-22 LAB — CBC WITH DIFFERENTIAL/PLATELET
Absolute Monocytes: 589 cells/uL (ref 200–950)
Basophils Absolute: 50 cells/uL (ref 0–200)
Basophils Relative: 0.6 %
Eosinophils Absolute: 83 cells/uL (ref 15–500)
Eosinophils Relative: 1 %
HCT: 44 % (ref 35.0–45.0)
Hemoglobin: 14.7 g/dL (ref 11.7–15.5)
Lymphs Abs: 1278 cells/uL (ref 850–3900)
MCH: 28.5 pg (ref 27.0–33.0)
MCHC: 33.4 g/dL (ref 32.0–36.0)
MCV: 85.4 fL (ref 80.0–100.0)
MPV: 10.3 fL (ref 7.5–12.5)
Monocytes Relative: 7.1 %
Neutro Abs: 6300 cells/uL (ref 1500–7800)
Neutrophils Relative %: 75.9 %
Platelets: 282 10*3/uL (ref 140–400)
RBC: 5.15 10*6/uL — ABNORMAL HIGH (ref 3.80–5.10)
RDW: 13 % (ref 11.0–15.0)
Total Lymphocyte: 15.4 %
WBC: 8.3 10*3/uL (ref 3.8–10.8)

## 2018-08-22 LAB — LIPID PANEL
Cholesterol: 191 mg/dL (ref <200)
HDL: 50 mg/dL (ref >=50)
LDL Cholesterol (Calc): 121 mg/dL (calc) — ABNORMAL HIGH
Non-HDL Cholesterol (Calc): 141 mg/dL (calc) — ABNORMAL HIGH (ref <130)
Total CHOL/HDL Ratio: 3.8 (calc) (ref <5.0)
Triglycerides: 92 mg/dL (ref <150)

## 2018-08-22 LAB — TSH: TSH: 1.03 mIU/L

## 2018-08-22 LAB — VITAMIN D 25 HYDROXY (VIT D DEFICIENCY, FRACTURES): Vit D, 25-Hydroxy: 56 ng/mL (ref 30–100)

## 2018-09-05 ENCOUNTER — Other Ambulatory Visit: Payer: Self-pay | Admitting: Adult Health

## 2018-10-14 ENCOUNTER — Other Ambulatory Visit: Payer: Self-pay | Admitting: Internal Medicine

## 2018-11-25 ENCOUNTER — Other Ambulatory Visit: Payer: Self-pay | Admitting: Adult Health

## 2018-12-09 ENCOUNTER — Other Ambulatory Visit: Payer: Self-pay | Admitting: Internal Medicine

## 2018-12-09 DIAGNOSIS — E782 Mixed hyperlipidemia: Secondary | ICD-10-CM

## 2019-01-19 ENCOUNTER — Other Ambulatory Visit: Payer: Self-pay | Admitting: Internal Medicine

## 2019-02-05 NOTE — Progress Notes (Addendum)
Complete Physical  Assessment and Plan:  [name] was seen today for annual exam.  Diagnoses and all orders for this visit:  Encounter for routine adult health examination with abnormal findings  Uncomplicated asthma, unspecified asthma severity, unspecified whether persistent Controlled without recent flares Avoid triggers, continue medications  Irritable bowel syndrome, unspecified type Well controlled with current regimen, continue linzess  Gall bladder disease No symptoms, monitor  Renal stone None recent, monitor, push water intake Has seen urology [name] in 2016  Vitamin D deficiency -     VITAMIN D 25 Hydroxy (Vit-D Deficiency, Fractures)  BMI 23 Continue to recommend diet heavy in fruits and veggies and low in animal meats, cheeses, and dairy products, appropriate calorie intake Discuss exercise recommendations routinely Continue to monitor weight at each visit  Hx of prediabetes Discussed disease and risks Discussed diet/exercise, weight management  -     COMPLETE METABOLIC PANEL WITH GFR -     Hemoglobin A1c -     Urinalysis, Routine w reflex microscopic  Medication management -     CBC with Differential/Platelet -     COMPLETE METABOLIC PANEL WITH GFR -     Magnesium -     Urinalysis, Routine w reflex microscopic  Hyperlipidemia, unspecified hyperlipidemia type -     Lipid panel -     TSH  Gastroesophageal reflux disease, esophagitis presence not specified -     Magnesium  Screening for cardiovascular condition -     EKG 12-Lead  Hx of right lateral epicondylitis Improved after injection; sx well managed by band and PRN NSAID Repeat steroid injection if needed or ortho referral    Discussed med's effects and SE's. Screening labs and tests as requested with regular follow-up as recommended. Over 40 minutes of exam, counseling, chart review, and complex, high level critical decision making was performed this visit.   Future Appointments   Date Time Provider [place]  02/11/2020  9:00 AM Liane Comber, NP [facility] None     HPI  48 y.o. female  presents for a complete physical and follow up for has Asthma; IBS (irritable bowel syndrome); Hyperlipidemia; Renal stone; Gall bladder disease; History of prediabetes; Medication management; Vitamin D deficiency; Overweight (BMI 25.0-29.9); GERD (gastroesophageal reflux disease); and History of right lateral epicondylitis on their problem list.   She is a Software engineer at [name], married, 2 kids, eldest daughter is in college. Younger is son - 55. Has been busy due to MIL dementia and supporting her as still wants to live by herself.   Mom had aortic valve replacement this year.   She has hx of R lateral epicondylitis; improved after steroid injection in 2019; she has been wearing support band at work, applies ice and takes mobic PRN.   She reports that she does occasionally get cystic acne lesions. She is also prescribed sulfa/sulfer face wash and spironolactone 50 mg and reports significant improvement. Followed by derm and gets annual total body skin check.    She has IBS-C with symptoms stabilized on linzess 72 mcg. Improved with recent diet.   She has asthma well controlled without recent flares on breo.   She does have a first degree relative with breast cancer.  Follows with [facility] (GYN) for PAPs and mammograms.   BMI is Body mass index is 23.74 kg/m., she has been working on diet and exercise though limited due to [employer] health, MIL health, dementia. She is on feet all day and active at work.  She is doing a high protein, 5-6 small meals daily  She drinks minimum 3-4 bottles of water daily  Wt Readings from Last 3 Encounters:  02/11/19 134 lb (60.8 kg)  08/21/18 147 lb (66.7 kg)  03/14/18 142 lb 9.6 oz (64.7 kg)   Her blood pressure has been controlled at home, today their BP is BP: 110/78 She does not workout. She denies chest pain, shortness of  breath, dizziness.   She is on cholesterol medication (gemfibrozil, omega 3) and denies myalgias. Her cholesterol is not at goal. The cholesterol last visit was:   Lab Results  Component Value Date   CHOL 191 08/21/2018   HDL 50 08/21/2018   LDLCALC 121 (H) 08/21/2018   TRIG 92 08/21/2018   CHOLHDL 3.8 08/21/2018   She has been working on diet and exercise for glucose management, and denies increased appetite, nausea, paresthesia of the feet, polydipsia, polyuria, visual disturbances and vomiting. Last A1C in the office was:  Lab Results  Component Value Date   HGBA1C 5.4 02/04/2018   Last GFR: Lab Results  Component Value Date   GFRNONAA 100 08/21/2018   Patient is on Vitamin D supplement, taking ? 5000 IU daily    Lab Results  Component Value Date   VD25OH 30 08/21/2018       Current Medications:  Current Outpatient Medications on File Prior to Visit  Medication Sig Dispense Refill  . albuterol (PROVENTIL HFA;VENTOLIN HFA) 108 (90 Base) MCG/ACT inhaler Use 2 inhalations 15 minutes apart every 4 hrs as needed to rescue Asthma 3 Inhaler 3  . aspirin 81 MG tablet Take 81 mg by mouth See admin instructions. Takes 1 tablet on mondays, wednesdays, and fridays    . azelastine (ASTELIN) 0.1 % nasal spray Place 2 sprays into both nostrils 2 (two) times daily. Use in each nostril as directed (Patient taking differently: Place 2 sprays into both nostrils as needed. Use in each nostril as directed) 90 mL 3  . bisoprolol-hydrochlorothiazide (ZIAC) 5-6.25 MG tablet TAKE 1/2 TO 1 (ONE-HALF TO ONE) TABLET BY MOUTH ONCE DAILY FOR BLOOD PRESSURE AND  MIGRAINE  PROPHYLAXIS 90 tablet 0  . BREO ELLIPTA 200-25 MCG/INH AEPB INHALE 1 PUFF BY MOUTH ONCE DAILY 180 each 3  . cholecalciferol (VITAMIN D) 1000 UNITS tablet Take 5,000 Units by mouth daily.     Marland Kitchen ezetimibe (ZETIA) 10 MG tablet Take 1 tablet Daily for Cholesterol 90 tablet 3  . famotidine (PEPCID) 20 MG tablet Take 20 mg by mouth 2 (two)  times daily.     Marland Kitchen FLUARIX QUADRIVALENT 0.5 ML injection     . gemfibrozil (LOPID) 600 MG tablet Take 1 tablet 2 x /day with meal for Cholesterol 180 tablet 3  . guaiFENesin (MUCINEX) 600 MG 12 hr tablet Take 600 mg by mouth as needed.     . linaclotide (LINZESS) 72 MCG capsule Take 1 capsule Daily for IBS-C 90 capsule 3  . Magnesium 400 MG CAPS Take 400 mg by mouth daily.    . meloxicam (MOBIC) 15 MG tablet Take 1/2 to 1 tablet Daily with Food for Pain & Inflammation 90 tablet 1  . Olopatadine HCl 0.2 % SOLN Use 1-2 drops per eye daily (Patient taking differently: as needed. Use 1-2 drops per eye daily) 2.5 mL 3  . Omega-3 Fatty Acids (FISH OIL) 1000 MG CAPS Take 1 capsule by mouth daily.    . SOOLANTRA 1 % CREA     . spironolactone (ALDACTONE) 50 MG  tablet Take 50 mg by mouth daily.    . Sulfacetamide Sodium-Sulfur 8-4 % SUSP [place] FACE ONCE OR TWICE DAILY  3  . vitamin E 400 UNIT capsule Take 400 Units by mouth daily.      No current facility-administered medications on file prior to visit.    Allergies:  Allergies  Allergen Reactions  . Food     strawberries   Medical History:  She has Asthma; IBS (irritable bowel syndrome); Hyperlipidemia; Renal stone; Gall bladder disease; History of prediabetes; Medication management; Vitamin D deficiency; Overweight (BMI 25.0-29.9); GERD (gastroesophageal reflux disease); and History of right lateral epicondylitis on their problem list. Health Maintenance:   Immunization History  Administered Date(s) Administered  . Hepatitis B 09/08/2015, 10/08/2015, 03/08/2016  . Influenza Split 12/05/2016, 11/29/2017, 12/07/2018  . Influenza-Unspecified 12/26/2013, 11/26/2014, 12/21/2015  . PPD Test 11/25/2013, 12/28/2016  . Tdap 09/17/2012    Tetanus: 2014 Flu vaccine: 11/2018  LMP: No LMP recorded. Patient has had a hysterectomy. Pap: 2017, gets q[facility]y at [facility], has scheduled this Thursday 02/13/2019 MGM: 10/2017 at GYN, has scheduled    Colonoscopy: due age 49 EGD:-   Last Dental Exam: [facility], last 2020, goes q75m Last Eye Exam: Dr. Agapito Games, 2020, goes annually Last Derm: Dr. Magnus Sinning, 2019, does annual total body   Patient Care Team: Unk Pinto, MD as PCP - General (Internal Medicine) Pyrtle, Lajuan Lines, MD as Referring Physician (Gastroenterology) Jimmey Ralph, NP (Inactive) as Nurse Practitioner (Urology) Everlene Farrier, MD as Consulting Physician (Obstetrics and Gynecology) Druscilla Brownie, MD as Consulting Physician (Dermatology) Inocencio Homes, DPM as Consulting Physician (Podiatry)  Surgical History:  She has a past surgical history that includes Ectopic pregnancy surgery (2005); Partial hysterectomy (2009); Esophagogastroduodenoscopy (06/13/2011); and Cholecystectomy (07/27/2011). Family History:  Herfamily history includes Breast cancer (age of onset: 19) in her mother; Diabetes type II in her father; Heart Problems in her mother; Hyperlipidemia in her mother and sister; Lymphoma in her mother; Migraines in her mother and sister; Ovarian cancer in her paternal grandmother; Stroke in her maternal grandmother. Social History:  She reports that she has never smoked. She has never used smokeless tobacco. She reports that she does not drink alcohol or use drugs.  Review of Systems: Review of Systems  Constitutional: Negative for malaise/fatigue and weight loss.  HENT: Negative for hearing loss and tinnitus.   Eyes: Negative for blurred vision and double vision.  Respiratory: Negative for cough, shortness of breath and wheezing.   Cardiovascular: Negative for chest pain, palpitations, orthopnea, claudication and leg swelling.  Gastrointestinal: Negative for abdominal pain, blood in stool, constipation, diarrhea, heartburn, melena, nausea and vomiting.  Genitourinary: Negative.   Musculoskeletal: Negative for joint pain and myalgias.  Skin: Negative for rash.   Neurological: Negative for dizziness, tingling, sensory change, weakness and headaches.  Endo/Heme/Allergies: Negative for polydipsia.  Psychiatric/Behavioral: Negative.   All other systems reviewed and are negative.   Physical Exam: Estimated body mass index is 23.74 kg/m as calculated from the following:   Height as of this encounter: 5\' 3"  (1.6 m).   Weight as of this encounter: 134 lb (60.8 kg). BP 110/78   Pulse 79   Temp 97.7 F (36.5 C)   Ht 5\' 3"  (1.6 m)   Wt 134 lb (60.8 kg)   SpO2 98%   BMI 23.74 kg/m  General Appearance: Well nourished, in no apparent distress.  Eyes: PERRLA, EOMs, conjunctiva no swelling or erythema, fundal exam deferred to ophth  Sinuses: No Frontal/maxillary tenderness  ENT/Mouth: Ext aud canals clear, normal light reflex with TMs without erythema, bulging. Good dentition. No erythema, swelling, or exudate on post pharynx. Tonsils not swollen or erythematous. Hearing normal.  Neck: Supple, thyroid normal. No bruits  Respiratory: Respiratory effort normal, BS equal bilaterally without rales, rhonchi, wheezing or stridor.  Cardio: RRR without murmurs, rubs or gallops. Brisk peripheral pulses without edema.  Chest: symmetric, with normal excursions and percussion.  Breasts: Defer to GYN Abdomen: Soft, nontender, no guarding, rebound, hernias, masses, or organomegaly.  Lymphatics: Non tender without lymphadenopathy.  Genitourinary: Defer to GYN Musculoskeletal: Full ROM all peripheral extremities,5/5 strength, and normal gait.  Skin: Warm, dry without rashes, lesions, ecchymosis. Neuro: Cranial nerves intact, reflexes equal bilaterally. Normal muscle tone, no cerebellar symptoms. Sensation intact.  Psych: Awake and oriented X 3, normal affect, Insight and Judgment appropriate.   EKG: WNL no ST changes, RSR' V1, V2, IRBBB  Gorden Harms Kiefer Opheim 9:16 AM [facility]

## 2019-02-11 ENCOUNTER — Ambulatory Visit (INDEPENDENT_AMBULATORY_CARE_PROVIDER_SITE_OTHER): Payer: BC Managed Care – PPO | Admitting: Adult Health

## 2019-02-11 ENCOUNTER — Other Ambulatory Visit: Payer: Self-pay

## 2019-02-11 ENCOUNTER — Encounter: Payer: Self-pay | Admitting: Adult Health

## 2019-02-11 VITALS — BP 110/78 | HR 79 | Temp 97.7°F | Ht 63.0 in | Wt 134.0 lb

## 2019-02-11 DIAGNOSIS — Z136 Encounter for screening for cardiovascular disorders: Secondary | ICD-10-CM | POA: Diagnosis not present

## 2019-02-11 DIAGNOSIS — Z13220 Encounter for screening for lipoid disorders: Secondary | ICD-10-CM | POA: Diagnosis not present

## 2019-02-11 DIAGNOSIS — Z8739 Personal history of other diseases of the musculoskeletal system and connective tissue: Secondary | ICD-10-CM

## 2019-02-11 DIAGNOSIS — Z0000 Encounter for general adult medical examination without abnormal findings: Secondary | ICD-10-CM

## 2019-02-11 DIAGNOSIS — Z131 Encounter for screening for diabetes mellitus: Secondary | ICD-10-CM | POA: Diagnosis not present

## 2019-02-11 DIAGNOSIS — Z1389 Encounter for screening for other disorder: Secondary | ICD-10-CM | POA: Diagnosis not present

## 2019-02-11 DIAGNOSIS — Z1329 Encounter for screening for other suspected endocrine disorder: Secondary | ICD-10-CM | POA: Diagnosis not present

## 2019-02-11 DIAGNOSIS — K829 Disease of gallbladder, unspecified: Secondary | ICD-10-CM

## 2019-02-11 DIAGNOSIS — E785 Hyperlipidemia, unspecified: Secondary | ICD-10-CM

## 2019-02-11 DIAGNOSIS — Z8249 Family history of ischemic heart disease and other diseases of the circulatory system: Secondary | ICD-10-CM | POA: Diagnosis not present

## 2019-02-11 DIAGNOSIS — K589 Irritable bowel syndrome without diarrhea: Secondary | ICD-10-CM

## 2019-02-11 DIAGNOSIS — Z6823 Body mass index (BMI) 23.0-23.9, adult: Secondary | ICD-10-CM

## 2019-02-11 DIAGNOSIS — J45909 Unspecified asthma, uncomplicated: Secondary | ICD-10-CM

## 2019-02-11 DIAGNOSIS — E559 Vitamin D deficiency, unspecified: Secondary | ICD-10-CM | POA: Diagnosis not present

## 2019-02-11 DIAGNOSIS — Z87898 Personal history of other specified conditions: Secondary | ICD-10-CM

## 2019-02-11 DIAGNOSIS — Z79899 Other long term (current) drug therapy: Secondary | ICD-10-CM

## 2019-02-11 DIAGNOSIS — K219 Gastro-esophageal reflux disease without esophagitis: Secondary | ICD-10-CM

## 2019-02-11 DIAGNOSIS — N200 Calculus of kidney: Secondary | ICD-10-CM

## 2019-02-11 NOTE — Patient Instructions (Addendum)
[name] , Thank you for taking time to come for your Annual Wellness Visit. I appreciate your ongoing commitment to your health goals. Please review the following plan we discussed and let me know if I can assist you in the future.   These are the goals we discussed: Goals    . DIET - INCREASE WATER INTAKE     65+ fluid ounces daily       This is a list of the screening recommended for you and due dates:  Health Maintenance  Topic Date Due  . HIV Screening  02/10/1986  . Pap Smear  03/13/2018  . Tetanus Vaccine  09/18/2022  . Flu Shot  Completed     Know what a healthy weight is for you (roughly BMI <25) and aim to maintain this  Aim for 7+ servings of fruits and vegetables daily  65-80+ fluid ounces of water or unsweet tea for healthy kidneys  Limit to max 1 drink of alcohol per day; avoid smoking/tobacco  Limit animal fats in diet for cholesterol and heart health - choose grass fed whenever available  Avoid highly processed foods, and foods high in saturated/trans fats  Aim for low stress - take time to unwind and care for your mental health  Aim for 150 min of moderate intensity exercise weekly for heart health, and weights twice weekly for bone health  Aim for 7-9 hours of sleep daily      Preventing High Cholesterol Cholesterol is a white, waxy substance similar to fat that the human body needs to help build cells. The liver makes all the cholesterol that a person's body needs. Having high cholesterol (hypercholesterolemia) increases a person's risk for heart disease and stroke. Extra (excess) cholesterol comes from the food the person eats. High cholesterol can often be prevented with diet and lifestyle changes. If you already have high cholesterol, you can control it with diet and lifestyle changes and with medicine. How can high cholesterol affect me? If you have high cholesterol, deposits (plaques) may build up on the walls of your arteries. The arteries are  the blood vessels that carry blood away from your heart. Plaques make the arteries narrower and stiffer. This can limit or block blood flow and cause blood clots to form. Blood clots:  Are tiny balls of cells that form in your blood.  Can move to the heart or brain, causing a heart attack or stroke. Plaques in arteries greatly increase your risk for heart attack and stroke.Making diet and lifestyle changes can reduce your risk for these conditions that may threaten your life. What can increase my risk? This condition is more likely to develop in people who:  Eat foods that are high in saturated fat or cholesterol. Saturated fat is mostly found in: ? Foods that contain animal fat, such as red meat and some dairy products. ? Certain fatty foods made from plants, such as tropical oils.  Are overweight.  Are not getting enough exercise.  Have a family history of high cholesterol. What actions can I take to prevent this? Nutrition   Eat less saturated fat.  Avoid trans fats (partially hydrogenated oils). These are often found in margarine and in some baked goods, fried foods, and snacks bought in packages.  Avoid precooked or cured meat, such as sausages or meat loaves.  Avoid foods and drinks that have added sugars.  Eat more fruits, vegetables, and whole grains.  Choose healthy sources of protein, such as fish, poultry, lean cuts  of red meat, beans, peas, lentils, and nuts.  Choose healthy sources of fat, such as: ? Nuts. ? Vegetable oils, especially olive oil. ? Fish that have healthy fats (omega-3 fatty acids), such as mackerel or salmon. The items listed above may not be a complete list of recommended foods and beverages. Contact a dietitian for more information. Lifestyle  Lose weight if you are overweight. Losing 5-10 lb (2.3-4.5 kg) can help prevent or control high cholesterol. It can also lower your risk for diabetes and high blood pressure. Ask your health care provider  to help you with a diet and exercise plan to lose weight safely.  Do not use any products that contain nicotine or tobacco, such as cigarettes, e-cigarettes, and chewing tobacco. If you need help quitting, ask your health care provider.  Limit your alcohol intake. ? Do not drink alcohol if:  Your health care provider tells you not to drink.  You are pregnant, may be pregnant, or are planning to become pregnant. ? If you drink alcohol:  Limit how much you use to:  0-1 drink a day for women.  0-2 drinks a day for men.  Be aware of how much alcohol is in your drink. In the [place] one drink equals one 12 oz bottle of beer (355 mL), one 5 oz glass of wine (148 mL), or one 1 oz glass of hard liquor (44 mL). Activity   Get enough exercise. Each week, do at least 150 minutes of exercise that takes a medium level of effort (moderate-intensity exercise). ? This is exercise that:  Makes your heart beat faster and makes you breathe harder than usual.  Allows you to still be able to talk. ? You could exercise in short sessions several times a day or longer sessions a few times a week. For example, on 5 days each week, you could walk fast or ride your bike 3 times a day for 10 minutes each time.  Do exercises as told by your health care provider. Medicines  In addition to diet and lifestyle changes, your health care provider may recommend medicines to help lower cholesterol. This may be a medicine to lower the amount of cholesterol your liver makes. You may need medicine if: ? Diet and lifestyle changes do not lower your cholesterol enough. ? You have high cholesterol and other risk factors for heart disease or stroke.  Take over-the-counter and prescription medicines only as told by your health care provider. General information  Manage your risk factors for high cholesterol. Talk with your health care provider about all your risk factors and how to lower your risk.  Manage other  conditions that you have, such as diabetes or high blood pressure (hypertension).  Have blood tests to check your cholesterol levels at regular points in time as told by your health care provider.  Keep all follow-up visits as told by your health care provider. This is important. Where to find more information  American Heart Association: www.heart.org  National Heart, Lung, and Blood Institute: https://wilson-eaton.com/ Summary  High cholesterol increases your risk for heart disease and stroke. By keeping your cholesterol level low, you can reduce your risk for these conditions.  High cholesterol can often be prevented with diet and lifestyle changes.  Work with your health care provider to manage your risk factors, and have your blood tested regularly. This information is not intended to replace advice given to you by your health care provider. Make sure you discuss any questions you have  with your health care provider. Document Released: 03/14/2015 Document Revised: 06/21/2018 Document Reviewed: 11/06/2015 Elsevier Patient Education  2020 [employer].

## 2019-02-12 LAB — HEMOGLOBIN A1C
Hgb A1c MFr Bld: 5.4 % of total Hgb (ref <5.7)
Mean Plasma Glucose: 108 (calc)
eAG (mmol/L): 6 (calc)

## 2019-02-12 LAB — CBC WITH DIFFERENTIAL/PLATELET
Absolute Monocytes: 496 cells/uL (ref 200–950)
Basophils Absolute: 29 cells/uL (ref 0–200)
Basophils Relative: 0.4 %
Eosinophils Absolute: 73 cells/uL (ref 15–500)
Eosinophils Relative: 1 %
HCT: 43.5 % (ref 35.0–45.0)
Hemoglobin: 14.5 g/dL (ref 11.7–15.5)
Lymphs Abs: 1110 cells/uL (ref 850–3900)
MCH: 28.5 pg (ref 27.0–33.0)
MCHC: 33.3 g/dL (ref 32.0–36.0)
MCV: 85.6 fL (ref 80.0–100.0)
MPV: 10.5 fL (ref 7.5–12.5)
Monocytes Relative: 6.8 %
Neutro Abs: 5592 cells/uL (ref 1500–7800)
Neutrophils Relative %: 76.6 %
Platelets: 337 10*3/uL (ref 140–400)
RBC: 5.08 10*6/uL (ref 3.80–5.10)
RDW: 12.5 % (ref 11.0–15.0)
Total Lymphocyte: 15.2 %
WBC: 7.3 10*3/uL (ref 3.8–10.8)

## 2019-02-12 LAB — LIPID PANEL
Cholesterol: 167 mg/dL (ref <200)
HDL: 50 mg/dL (ref >=50)
LDL Cholesterol (Calc): 104 mg/dL (calc) — ABNORMAL HIGH
Non-HDL Cholesterol (Calc): 117 mg/dL (calc) (ref <130)
Total CHOL/HDL Ratio: 3.3 (calc) (ref <5.0)
Triglycerides: 44 mg/dL (ref <150)

## 2019-02-12 LAB — COMPLETE METABOLIC PANEL WITH GFR
AG Ratio: 2 (calc) (ref 1.0–2.5)
ALT: 24 U/L (ref 6–29)
AST: 23 U/L (ref 10–35)
Albumin: 4.6 g/dL (ref 3.6–5.1)
Alkaline phosphatase (APISO): 82 U/L (ref 31–125)
BUN: 22 mg/dL (ref 7–25)
CO2: 25 mmol/L (ref 20–32)
Calcium: 9.4 mg/dL (ref 8.6–10.2)
Chloride: 104 mmol/L (ref 98–110)
Creat: 0.57 mg/dL (ref 0.50–1.10)
GFR, Est African American: 127 mL/min/{1.73_m2} (ref >=60)
GFR, Est Non African American: 110 mL/min/{1.73_m2} (ref >=60)
Globulin: 2.3 g/dL (calc) (ref 1.9–3.7)
Glucose, Bld: 78 mg/dL (ref 65–99)
Potassium: 4.2 mmol/L (ref 3.5–5.3)
Sodium: 138 mmol/L (ref 135–146)
Total Bilirubin: 0.7 mg/dL (ref 0.2–1.2)
Total Protein: 6.9 g/dL (ref 6.1–8.1)

## 2019-02-12 LAB — URINALYSIS, ROUTINE W REFLEX MICROSCOPIC
Bilirubin Urine: NEGATIVE
Glucose, UA: NEGATIVE
Hgb urine dipstick: NEGATIVE
Ketones, ur: NEGATIVE
Leukocytes,Ua: NEGATIVE
Nitrite: NEGATIVE
Protein, ur: NEGATIVE
Specific Gravity, Urine: 1.01 (ref 1.001–1.03)
pH: 6.5 (ref 5.0–8.0)

## 2019-02-12 LAB — MAGNESIUM: Magnesium: 2.2 mg/dL (ref 1.5–2.5)

## 2019-02-12 LAB — VITAMIN D 25 HYDROXY (VIT D DEFICIENCY, FRACTURES): Vit D, 25-Hydroxy: 72 ng/mL (ref 30–100)

## 2019-02-12 LAB — TSH: TSH: 0.97 mIU/L

## 2019-02-26 ENCOUNTER — Other Ambulatory Visit: Payer: Self-pay | Admitting: Internal Medicine

## 2019-04-12 ENCOUNTER — Other Ambulatory Visit: Payer: Self-pay | Admitting: Internal Medicine

## 2019-04-12 MED ORDER — BISOPROLOL FUMARATE 5 MG PO TABS: ORAL_TABLET | ORAL | 3 refills | Status: DC

## 2019-06-19 ENCOUNTER — Other Ambulatory Visit: Payer: Self-pay | Admitting: Adult Health

## 2019-06-19 MED ORDER — LINACLOTIDE 145 MCG PO CAPS: ORAL_CAPSULE | ORAL | 1 refills | Status: DC

## 2019-07-14 ENCOUNTER — Other Ambulatory Visit: Payer: Self-pay | Admitting: Internal Medicine

## 2019-07-14 DIAGNOSIS — J4541 Moderate persistent asthma with (acute) exacerbation: Secondary | ICD-10-CM

## 2019-07-14 MED ORDER — ALBUTEROL SULFATE HFA 108 (90 BASE) MCG/ACT IN AERS: INHALATION_SPRAY | RESPIRATORY_TRACT | 3 refills | Status: DC

## 2019-07-30 ENCOUNTER — Other Ambulatory Visit: Payer: Self-pay | Admitting: Adult Health

## 2019-07-30 MED ORDER — FLUCONAZOLE 150 MG PO TABS: 150.0000 mg | ORAL_TABLET | Freq: Once | ORAL | 3 refills | Status: AC

## 2019-08-12 ENCOUNTER — Ambulatory Visit: Payer: BC Managed Care – PPO | Admitting: Adult Health

## 2019-08-18 ENCOUNTER — Encounter: Payer: Self-pay | Admitting: Adult Health

## 2019-08-18 DIAGNOSIS — T466X5A Adverse effect of antihyperlipidemic and antiarteriosclerotic drugs, initial encounter: Secondary | ICD-10-CM | POA: Insufficient documentation

## 2019-08-18 DIAGNOSIS — G720 Drug-induced myopathy: Secondary | ICD-10-CM | POA: Insufficient documentation

## 2019-08-18 HISTORY — DX: Drug-induced myopathy: G72.0

## 2019-08-18 HISTORY — DX: Drug-induced myopathy: T46.6X5A

## 2019-08-18 NOTE — Progress Notes (Signed)
FOLLOW UP  Assessment and Plan:   Athma Well controlled with current medications  Continue inhalers Avoid triggers  Cholesterol Currently near goal; continue zetia/lopid Hx of severe myalgias with statin Continue low cholesterol diet and exercise.  Check lipid panel.   Other abnormal glucose Recent A1Cs at goal Discussed diet/exercise, weight management  Defer A1C; check CMP  BMI 25 Continue to recommend diet heavy in fruits and veggies and low in animal meats, cheeses, and dairy products, appropriate calorie intake Discuss exercise recommendations routinely; cut back intermittent fasting to 3 days a week, carb cycling discussed, high protein high fiber mod healthy fat reviewed Continue to monitor weight at each visit  Vitamin D Def Below goal at last visit; she increased from 1000 IU to 2000 IU  continue supplementation to maintain goal of 60-100 Check Vit D level  Bil knee pain, chronic Suspect mild arthritis; continue meloxicam, try adding tylenol on work days  If getting worse refer to ortho for imaging and possible injections She prefers to defer at this time   Continue diet and meds as discussed. Further disposition pending results of labs. Discussed med's effects and SE's.   Over 30 minutes of exam, counseling, chart review, and critical decision making was performed.   Future Appointments  Date Time Provider [place]  02/11/2020  9:00 AM Liane Comber, NP [facility] None    ----------------------------------------------------------------------------------------------------------------------  HPI 49 y.o. female  presents for 3 month follow up on asthma, cholesterol, glucose management, weight and vitamin D deficiency.   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 Wears band PRN. 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    Asthma treated by Adair Patter, doing well without flares.    She has IBS treated by linzess+ benefiber. She is doing very well.  she has a diagnosis of GERD which is currently managed by famotidine 20 mg BID she reports symptoms is currently well controlled, and denies breakthrough reflux, burning in chest, hoarseness or cough.    BMI is Body mass index is 24.09 kg/m., she has been working on diet, exercise.  Was doing low carb diet, premier protein shakes and doing well, cutting down on processed carbs.  Wt Readings from Last 3 Encounters:  08/19/19 136 lb (61.7 kg)  02/11/19 134 lb (60.8 kg)  08/21/18 147 lb (66.7 kg)   Today their BP is BP: 114/74 She is on spironolactone for acne, more with masks, doing fairly well   She does workout. She denies chest pain, shortness of breath, dizziness.   She is on cholesterol medication (zetia, lopid) and denies myalgias. Hx of severe myalgias with pravastatin. Her cholesterol is not at goal. The cholesterol last visit was:   Lab Results  Component Value Date   CHOL 167 02/11/2019   HDL 50 02/11/2019   LDLCALC 104 (H) 02/11/2019   TRIG 44 02/11/2019   CHOLHDL 3.3 02/11/2019     She has been working on diet and exercise for glucose managmenet, and denies foot ulcerations, increased appetite, nausea, paresthesia of the feet, polydipsia, polyuria, visual disturbances, vomiting and weight loss. Last A1C in the office was:  Lab Results  Component Value Date   HGBA1C 5.4 02/11/2019   Patient is on Vitamin D supplement Lab Results  Component Value Date   VD25OH 72 02/11/2019        Current Medications:  Current Outpatient Medications on  File Prior to Visit  Medication Sig  . albuterol (VENTOLIN HFA) 108 (90 Base) MCG/ACT inhaler Use 2 inhalations 15 minutes apart every 4 hrs as needed to rescue Asthma  . aspirin 81 MG tablet Take 81 mg by mouth See admin instructions. Takes 1 tablet on mondays, wednesdays, and fridays  . azelastine  (ASTELIN) 0.1 % nasal spray Place 2 sprays into both nostrils 2 (two) times daily. Use in each nostril as directed (Patient taking differently: Place 2 sprays into both nostrils as needed. Use in each nostril as directed)  . bisoprolol (ZEBETA) 5 MG tablet Take 1 tablet Daily for BP  . cholecalciferol (VITAMIN D) 1000 UNITS tablet Take 5,000 Units by mouth daily.   Marland Kitchen ezetimibe (ZETIA) 10 MG tablet Take 1 tablet Daily for Cholesterol  . famotidine (PEPCID) 20 MG tablet Take 20 mg by mouth 2 (two) times daily.   Marland Kitchen FLUARIX QUADRIVALENT 0.5 ML injection   . fluticasone furoate-vilanterol (BREO ELLIPTA) 200-25 MCG/INH AEPB Inhale 1 puff into the lungs daily.  Marland Kitchen gemfibrozil (LOPID) 600 MG tablet Take 1 tablet 2 x /day with meal for Cholesterol  . guaiFENesin (MUCINEX) 600 MG 12 hr tablet Take 600 mg by mouth as needed.   . linaclotide (LINZESS) 145 MCG CAPS capsule Take 1 capsule Daily for IBS-C (Patient taking differently: 72 mcg. Take 1 capsule Daily for IBS-C)  . Magnesium 400 MG CAPS Take 400 mg by mouth daily.  . meloxicam (MOBIC) 15 MG tablet Take 1/2 to 1 tablet Daily with Food for Pain & Inflammation & try limit to 4-5 tabs /week to avoid Kidney Damage  . Olopatadine HCl 0.2 % SOLN Use 1-2 drops per eye daily (Patient taking differently: as needed. Use 1-2 drops per eye daily)  . Omega-3 Fatty Acids (FISH OIL) 1000 MG CAPS Take 1 capsule by mouth daily.  . SOOLANTRA 1 % CREA   . spironolactone (ALDACTONE) 50 MG tablet Take 50 mg by mouth daily.  . Sulfacetamide Sodium-Sulfur 8-4 % SUSP [place] FACE ONCE OR TWICE DAILY  . vitamin E 400 UNIT capsule Take 400 Units by mouth daily.   . bisoprolol-hydrochlorothiazide (ZIAC) 5-6.25 MG tablet TAKE 1/2 TO 1 (ONE-HALF TO ONE) TABLET BY MOUTH ONCE DAILY FOR BLOOD PRESSURE AND  MIGRAINE  PROPHYLAXIS   No current facility-administered medications on file prior to visit.     Allergies:  Allergies  Allergen Reactions  . Food     strawberries  .  Pravastatin     Severe myalgias      Medical History:  Past Medical History:  Diagnosis Date  . Acne   . Arthritis   . Asthma   . Chronic kidney disease   . Difficulty sleeping    due to discomfort  . Elevated cholesterol   . GERD (gastroesophageal reflux disease)   . HLD (hyperlipidemia)   . IBS (irritable bowel syndrome)   . Nausea   . Statin myopathy 08/18/2019   Family history- Reviewed and unchanged Social history- Reviewed and unchanged   Review of Systems:  Review of Systems  Constitutional: Negative for malaise/fatigue and weight loss.  HENT: Negative for hearing loss and tinnitus.   Eyes: Negative for blurred vision and double vision.  Respiratory: Negative for cough, shortness of breath and wheezing.   Cardiovascular: Negative for chest pain, palpitations, orthopnea, claudication and leg swelling.  Gastrointestinal: Negative for abdominal pain, blood in stool, constipation, diarrhea, heartburn, melena, nausea and vomiting.  Genitourinary: Negative.   Musculoskeletal: Positive for  joint pain (bil knees at work, chronic). Negative for myalgias.  Skin: Negative for rash.  Neurological: Negative for dizziness, tingling, sensory change, weakness and headaches.  Endo/Heme/Allergies: Negative for polydipsia.  Psychiatric/Behavioral: Negative.   All other systems reviewed and are negative.    Physical Exam: BP 114/74   Pulse (!) 55   Temp 97.7 F (36.5 C)   Ht 5\' 3"  (1.6 m)   Wt 136 lb (61.7 kg)   SpO2 99%   BMI 24.09 kg/m  Wt Readings from Last 3 Encounters:  08/19/19 136 lb (61.7 kg)  02/11/19 134 lb (60.8 kg)  08/21/18 147 lb (66.7 kg)   General Appearance: Well nourished, in no apparent distress. Eyes: PERRLA, EOMs, conjunctiva no swelling or erythema Sinuses: No Frontal/maxillary tenderness ENT/Mouth: Ext aud canals clear, TMs without erythema, bulging. No erythema, swelling, or exudate on post pharynx.  Tonsils not swollen or erythematous. Hearing  normal.  Neck: Supple, thyroid normal.  Respiratory: Respiratory effort normal, BS equal bilaterally without rales, rhonchi, wheezing or stridor.  Cardio: RRR with no MRGs. Brisk peripheral pulses without edema.  Abdomen: Soft, + BS.  Non tender, no guarding, rebound, hernias, masses. Lymphatics: Non tender without lymphadenopathy.  Musculoskeletal: Full ROM, 5/5 strength, Normal gait. Lateral epicondyle non tender, non-inflamed.  Skin: Warm, dry without rashes, lesions, ecchymosis.  Neuro: Cranial nerves intact. No cerebellar symptoms.  Psych: Awake and oriented X 3, normal affect, Insight and Judgment appropriate.    Izora Ribas, NP 10:47 AM [name]

## 2019-08-19 ENCOUNTER — Ambulatory Visit: Payer: BC Managed Care – PPO | Admitting: Adult Health

## 2019-08-19 ENCOUNTER — Other Ambulatory Visit: Payer: Self-pay

## 2019-08-19 ENCOUNTER — Encounter: Payer: Self-pay | Admitting: Adult Health

## 2019-08-19 VITALS — BP 114/74 | HR 55 | Temp 97.7°F | Ht 63.0 in | Wt 136.0 lb

## 2019-08-19 DIAGNOSIS — K589 Irritable bowel syndrome without diarrhea: Secondary | ICD-10-CM | POA: Diagnosis not present

## 2019-08-19 DIAGNOSIS — E785 Hyperlipidemia, unspecified: Secondary | ICD-10-CM | POA: Diagnosis not present

## 2019-08-19 DIAGNOSIS — Z79899 Other long term (current) drug therapy: Secondary | ICD-10-CM | POA: Diagnosis not present

## 2019-08-19 DIAGNOSIS — K219 Gastro-esophageal reflux disease without esophagitis: Secondary | ICD-10-CM

## 2019-08-19 DIAGNOSIS — J45909 Unspecified asthma, uncomplicated: Secondary | ICD-10-CM | POA: Diagnosis not present

## 2019-08-19 DIAGNOSIS — M25561 Pain in right knee: Secondary | ICD-10-CM

## 2019-08-19 DIAGNOSIS — E559 Vitamin D deficiency, unspecified: Secondary | ICD-10-CM

## 2019-08-19 DIAGNOSIS — Z87898 Personal history of other specified conditions: Secondary | ICD-10-CM | POA: Diagnosis not present

## 2019-08-19 DIAGNOSIS — M25562 Pain in left knee: Secondary | ICD-10-CM

## 2019-08-19 DIAGNOSIS — G8929 Other chronic pain: Secondary | ICD-10-CM

## 2019-08-19 DIAGNOSIS — G720 Drug-induced myopathy: Secondary | ICD-10-CM

## 2019-08-19 NOTE — Patient Instructions (Signed)
Try adding tylenol - [phone] mg up to TID PRN with NSAID  Avoid taking ibuprofen with meloxicam   Could try icing knees at the end of the day    Acute Knee Pain, Adult Acute knee pain is sudden and may be caused by damage, swelling, or irritation of the muscles and tissues that support your knee. The injury may result from:  A fall.  An injury to your knee from twisting motions.  A hit to the knee.  Infection. Acute knee pain may go away on its own with time and rest. If it does not, your health care provider may order tests to find the cause of the pain. These may include:  Imaging tests, such as an X-ray, MRI, or ultrasound.  Joint aspiration. In this test, fluid is removed from the knee.  Arthroscopy. In this test, a lighted tube is inserted into the knee and an image is projected onto a TV screen.  Biopsy. In this test, a sample of tissue is removed from the body and studied under a microscope. Follow these instructions at home: Pay attention to any changes in your symptoms. Take these actions to relieve your pain. If you have a knee sleeve or brace:   Wear the sleeve or brace as told by your health care provider. Remove it only as told by your health care provider.  Loosen the sleeve or brace if your toes tingle, become numb, or turn cold and blue.  Keep the sleeve or brace clean.  If the sleeve or brace is not waterproof: ? Do not let it get wet. ? Cover it with a watertight covering when you take a bath or shower. Activity  Rest your knee.  Do not do things that cause pain or make pain worse.  Avoid high-impact activities or exercises, such as running, jumping rope, or doing jumping jacks.  Work with a physical therapist to make a safe exercise program, as recommended by your health care provider. Do exercises as told by your physical therapist. Managing pain, stiffness, and swelling   If directed, put ice on the knee: ? Put ice in a plastic  bag. ? Place a towel between your skin and the bag. ? Leave the ice on for 20 minutes, 2-3 times a day.  If directed, use an elastic bandage to put pressure (compression) on your injured knee. This may control swelling, give support, and help with discomfort. General instructions  Take over-the-counter and prescription medicines only as told by your health care provider.  Raise (elevate) your knee above the level of your heart when you are sitting or lying down.  Sleep with a pillow under your knee.  Do not use any products that contain nicotine or tobacco, such as cigarettes, e-cigarettes, and chewing tobacco. These can delay healing. If you need help quitting, ask your health care provider.  If you are overweight, work with your health care provider and a dietitian to set a weight-loss goal that is healthy and reasonable for you. Extra weight can put pressure on your knee.  Keep all follow-up visits as told by your health care provider. This is important. Contact a health care provider if:  Your knee pain continues, changes, or gets worse.  You have a fever along with knee pain.  Your knee feels warm to the touch.  Your knee buckles or locks up. Get help right away if:  Your knee swells, and the swelling becomes worse.  You cannot move your knee.  You have severe pain in your knee. Summary  Acute knee pain can be caused by a fall, an injury, an infection, or damage, swelling, or irritation of the tissues that support your knee.  Your health care provider may perform tests to find out the cause of the pain.  Pay attention to any changes in your symptoms. Relieve your pain with rest, medicines, light activity, and use of ice.  Get help if your pain continues or becomes worse, your knee swells, or you cannot move your knee. This information is not intended to replace advice given to you by your health care provider. Make sure you discuss any questions you have with your  health care provider. Document Revised: 08/09/2017 Document Reviewed: 08/09/2017 Elsevier Patient Education  [place].

## 2019-08-20 LAB — CBC WITH DIFFERENTIAL/PLATELET
Absolute Monocytes: 496 cells/uL (ref 200–950)
Basophils Absolute: 30 cells/uL (ref 0–200)
Basophils Relative: 0.4 %
Eosinophils Absolute: 81 cells/uL (ref 15–500)
Eosinophils Relative: 1.1 %
HCT: 43.5 % (ref 35.0–45.0)
Hemoglobin: 14.3 g/dL (ref 11.7–15.5)
Lymphs Abs: 1399 cells/uL (ref 850–3900)
MCH: 28.9 pg (ref 27.0–33.0)
MCHC: 32.9 g/dL (ref 32.0–36.0)
MCV: 88.1 fL (ref 80.0–100.0)
MPV: 10.5 fL (ref 7.5–12.5)
Monocytes Relative: 6.7 %
Neutro Abs: 5395 cells/uL (ref 1500–7800)
Neutrophils Relative %: 72.9 %
Platelets: 263 10*3/uL (ref 140–400)
RBC: 4.94 10*6/uL (ref 3.80–5.10)
RDW: 12.3 % (ref 11.0–15.0)
Total Lymphocyte: 18.9 %
WBC: 7.4 10*3/uL (ref 3.8–10.8)

## 2019-08-20 LAB — COMPLETE METABOLIC PANEL WITH GFR
AG Ratio: 2.3 (calc) (ref 1.0–2.5)
ALT: 11 U/L (ref 6–29)
AST: 12 U/L (ref 10–35)
Albumin: 4.3 g/dL (ref 3.6–5.1)
Alkaline phosphatase (APISO): 52 U/L (ref 31–125)
BUN: 20 mg/dL (ref 7–25)
CO2: 23 mmol/L (ref 20–32)
Calcium: 9.3 mg/dL (ref 8.6–10.2)
Chloride: 105 mmol/L (ref 98–110)
Creat: 0.63 mg/dL (ref 0.50–1.10)
GFR, Est African American: 123 mL/min/{1.73_m2} (ref >=60)
GFR, Est Non African American: 106 mL/min/{1.73_m2} (ref >=60)
Globulin: 1.9 g/dL (calc) (ref 1.9–3.7)
Glucose, Bld: 83 mg/dL (ref 65–99)
Potassium: 4.3 mmol/L (ref 3.5–5.3)
Sodium: 139 mmol/L (ref 135–146)
Total Bilirubin: 0.7 mg/dL (ref 0.2–1.2)
Total Protein: 6.2 g/dL (ref 6.1–8.1)

## 2019-08-20 LAB — LIPID PANEL
Cholesterol: 185 mg/dL (ref <200)
HDL: 54 mg/dL (ref >=50)
LDL Cholesterol (Calc): 116 mg/dL (calc) — ABNORMAL HIGH
Non-HDL Cholesterol (Calc): 131 mg/dL (calc) — ABNORMAL HIGH (ref <130)
Total CHOL/HDL Ratio: 3.4 (calc) (ref <5.0)
Triglycerides: 56 mg/dL (ref <150)

## 2019-08-20 LAB — MAGNESIUM: Magnesium: 2.2 mg/dL (ref 1.5–2.5)

## 2019-08-20 LAB — TSH: TSH: 0.88 mIU/L

## 2019-11-26 ENCOUNTER — Other Ambulatory Visit: Payer: Self-pay | Admitting: Internal Medicine

## 2019-11-30 ENCOUNTER — Other Ambulatory Visit: Payer: Self-pay | Admitting: Internal Medicine

## 2019-11-30 DIAGNOSIS — E782 Mixed hyperlipidemia: Secondary | ICD-10-CM

## 2020-02-10 NOTE — Progress Notes (Signed)
Complete Physical  Assessment and Plan:  [name] was seen today for annual exam.  Diagnoses and all orders for this visit:  Encounter for routine adult health examination with abnormal findings  Uncomplicated asthma, unspecified asthma severity, unspecified whether persistent Controlled without recent flares Avoid triggers, continue medications  Irritable bowel syndrome, constipation Well controlled with current regimen, discussed increasing soluble fiber intake  continue linzess  Gall bladder disease No symptoms, monitor  Renal stone None recent, monitor, push water intake Has seen urology [name] in 2016  Vitamin D deficiency -     VITAMIN D 25 Hydroxy (Vit-D Deficiency, Fractures)  BMI 25 Continue to recommend diet heavy in fruits and veggies and low in animal meats, cheeses, and dairy products, appropriate calorie intake Discuss exercise recommendations routinely Continue to monitor weight at each visit  Hx of prediabetes Discussed disease and risks Discussed diet/exercise, weight management  -     COMPLETE METABOLIC PANEL WITH GFR -     Hemoglobin A1c -     Urinalysis, Routine w reflex microscopic  Medication management -     CBC with Differential/Platelet -     COMPLETE METABOLIC PANEL WITH GFR -     Magnesium -     Urinalysis, Routine w reflex microscopic  Hyperlipidemia, unspecified hyperlipidemia type/ statin myopathy Continue medications Continue low cholesterol diet and exercise.  Check lipid panel.  -     Lipid panel -     TSH  Gastroesophageal reflux disease, esophagitis presence not specified -     Magnesium  Screening for cardiovascular condition -     EKG 12-Lead  Hx of right lateral epicondylitis Improved after injection; sx well managed by band and PRN NSAID Repeat steroid injection if needed, est with ortho   Discussed med's effects and SE's. Screening labs and tests as requested with regular follow-up as recommended. Over 40  minutes of exam, counseling, chart review, and complex, high level critical decision making was performed this visit.   Future Appointments  Date Time Provider [place]  08/11/2020  9:30 AM Liane Comber, NP [facility] None  02/11/2021  9:30 AM Liane Comber, NP [facility] None     HPI  49 y.o. female  presents for a complete physical and follow up for has Asthma; IBS (irritable bowel syndrome); Hyperlipidemia; Renal stone; Gall bladder disease; History of prediabetes; Medication management; Vitamin D deficiency; GERD (gastroesophageal reflux disease); History of right lateral epicondylitis; Statin myopathy; Chronic pain of both knees; and B12 deficiency on their problem list.   She is a Software engineer at [employer], married, 2 kids, eldest daughter is in college. Younger is son 33.   Mom had aortic valve replacement last year but is doing much better this year.   She has hx of R lateral epicondylitis; improved after steroid injection in 2019; she has been wearing support band at work for intermittent flares, applies ice and takes clebrex PRN. Has bil knee pain, now seeing [name] and had steroid injections which has helped.   She reports that she does occasionally get cystic acne lesions. She is also prescribed sulfa/sulfer face wash and spironolactone 50 mg and reports significant improvement. Followed by derm and gets annual total body skin check.    She has IBS-C with symptoms stabilized on linzess 72 mcg. Improved with recent diet.   She has asthma well controlled without recent flares on breo.   She does have a first degree relative with breast cancer.  Follows with [facility] (GYN)  [name] for PAPs and mammograms.   BMI is Body mass index is 25.69 kg/m., she has been working on diet and exercise though limited due to [employer] health, MIL health, dementia. She is on feet all day and active at work.  She is doing a high protein, 5-6 small meals daily  She  drinks minimum 3-4 bottles of water daily  Wt Readings from Last 3 Encounters:  02/11/20 145 lb (65.8 kg)  08/19/19 136 lb (61.7 kg)  02/11/19 134 lb (60.8 kg)   Her blood pressure has been controlled at home, today their BP is BP: 104/70 She does not workout. She denies chest pain, shortness of breath, dizziness.   She is on cholesterol medication (gemfibrozil, zetia, omega 3) and denies myalgias. Her cholesterol is not at goal. The cholesterol last visit was:   Lab Results  Component Value Date   CHOL 185 08/19/2019   HDL 54 08/19/2019   LDLCALC 116 (H) 08/19/2019   TRIG 56 08/19/2019   CHOLHDL 3.4 08/19/2019   She has been working on diet and exercise for glucose management, and denies increased appetite, nausea, paresthesia of the feet, polydipsia, polyuria, visual disturbances and vomiting. Last A1C in the office was:  Lab Results  Component Value Date   HGBA1C 5.4 02/11/2019   Last GFR: Lab Results  Component Value Date   GFRNONAA 106 08/19/2019   Patient is on Vitamin D supplement, taking ? 5000 IU daily, takes 2000 IU during the summer Lab Results  Component Value Date   VD25OH 72 02/11/2019   She takes B12 cap supplement 3 days a week;  Lab Results  Component Value Date   EGBTDVVO16 0,737 (H) 02/04/2018     Current Medications:  Current Outpatient Medications on File Prior to Visit  Medication Sig Dispense Refill  . albuterol (VENTOLIN HFA) 108 (90 Base) MCG/ACT inhaler Use 2 inhalations 15 minutes apart every 4 hrs as needed to rescue Asthma 48 g 3  . aspirin 81 MG tablet Take 81 mg by mouth See admin instructions. Takes 1 tablet on mondays, wednesdays, and fridays    . azelastine (ASTELIN) 0.1 % nasal spray Place 2 sprays into both nostrils 2 (two) times daily. Use in each nostril as directed (Patient taking differently: Place 2 sprays into both nostrils as needed. Use in each nostril as directed) 90 mL 3  . bisoprolol (ZEBETA) 5 MG tablet Take 1 tablet Daily  for BP 90 tablet 3  . celecoxib (CELEBREX) 200 MG capsule Take 200 mg by mouth daily.    . cholecalciferol (VITAMIN D) 1000 UNITS tablet Take 5,000 Units by mouth daily.     Marland Kitchen ezetimibe (ZETIA) 10 MG tablet Take     1 tablet      Daily       for Cholesterol 90 tablet 0  . famotidine (PEPCID) 20 MG tablet Take 20 mg by mouth 2 (two) times daily.     Marland Kitchen FLUARIX QUADRIVALENT 0.5 ML injection     . fluticasone furoate-vilanterol (BREO ELLIPTA) 200-25 MCG/INH AEPB Inhale 1 puff into the lungs daily. 180 each 3  . gemfibrozil (LOPID) 600 MG tablet Take    1 tablet     2 x /day     with Meals for Cholesterol 180 tablet 0  . guaiFENesin (MUCINEX) 600 MG 12 hr tablet Take 600 mg by mouth as needed.     . linaclotide (LINZESS) 72 MCG capsule Take    1 capsule  Daily    for IBS-C 90 capsule 0  . Magnesium 400 MG CAPS Take 400 mg by mouth daily.    . Olopatadine HCl 0.2 % SOLN Use 1-2 drops per eye daily (Patient taking differently: as needed. Use 1-2 drops per eye daily) 2.5 mL 3  . Omega-3 Fatty Acids (FISH OIL) 1000 MG CAPS Take 1 capsule by mouth daily.    . SOOLANTRA 1 % CREA     . spironolactone (ALDACTONE) 50 MG tablet Take 50 mg by mouth daily.    . Sulfacetamide Sodium-Sulfur 8-4 % SUSP Shannon FACE ONCE OR TWICE DAILY  3  . vitamin E 400 UNIT capsule Take 400 Units by mouth daily.      No current facility-administered medications on file prior to visit.   Allergies:  Allergies  Allergen Reactions  . Food     strawberries  . Pravastatin     Severe myalgias    Medical History:  She has Asthma; IBS (irritable bowel syndrome); Hyperlipidemia; Renal stone; Gall bladder disease; History of prediabetes; Medication management; Vitamin D deficiency; GERD (gastroesophageal reflux disease); History of right lateral epicondylitis; Statin myopathy; Chronic pain of both knees; and B12 deficiency on their problem list. Health Maintenance:   Immunization History  Administered Date(s) Administered  .  Hepatitis B 09/08/2015, 10/08/2015, 03/08/2016  . Influenza Split 12/05/2016, 11/29/2017, 12/07/2018  . Influenza,inj,quad, With Preservative 01/03/2020  . Influenza-Unspecified 12/26/2013, 11/26/2014, 12/21/2015  . PFIZER SARS-COV-2 Vaccination 11/08/2019, 11/29/2019  . PPD Test 11/25/2013, 12/28/2016  . Tdap 09/17/2012    Tetanus: 2014 Flu vaccine: 11/2019 Covid 19: pfizer, 2/2, 2021   LMP: No LMP recorded. Patient has had a hysterectomy. Pap: 02/13/2019, [facility], goes annually for pelvic  MGM: 2021 at GYN, has scheduled 03/2020  Colonoscopy: new guidelines age 16, wants to wait until age 42, no family history EGD:-   Last Dental Exam: [facility], last 2021, goes q77m Last Eye Exam: Dr. Agapito Games, 2020, goes annually, needs to reschedule for this year  Last Derm: Dr. Magnus Sinning, 2021, does annual total body   Patient Care Team: Unk Pinto, MD as PCP - General (Internal Medicine) Pyrtle, Lajuan Lines, MD as Referring Physician (Gastroenterology) Jimmey Ralph, NP (Inactive) as Nurse Practitioner (Urology) Everlene Farrier, MD as Consulting Physician (Obstetrics and Gynecology) Druscilla Brownie, MD as Consulting Physician (Dermatology) Inocencio Homes, DPM as Consulting Physician (Podiatry)  Surgical History:  She has a past surgical history that includes Ectopic pregnancy surgery (2005); Partial hysterectomy (2009); Esophagogastroduodenoscopy (06/13/2011); and Cholecystectomy (07/27/2011). Family History:  Herfamily history includes Breast cancer (age of onset: 21) in her mother; Diabetes type II in her father; Heart Problems in her mother; Hyperlipidemia in her mother and sister; Lymphoma in her mother; Migraines in her mother and sister; Ovarian cancer in her paternal grandmother; Stroke in her maternal grandmother; Valvular heart disease in her mother. Social History:  She reports that she has never smoked. She has never used  smokeless tobacco. She reports that she does not drink alcohol and does not use drugs.  Review of Systems: Review of Systems  Constitutional: Negative for malaise/fatigue and weight loss.  HENT: Negative for hearing loss and tinnitus.   Eyes: Negative for blurred vision and double vision.  Respiratory: Negative for cough, shortness of breath and wheezing.   Cardiovascular: Negative for chest pain, palpitations, orthopnea, claudication and leg swelling.  Gastrointestinal: Positive for constipation (IBS D, improved with meds, intermittent). Negative for abdominal pain, blood  in stool, diarrhea, heartburn, melena, nausea and vomiting.  Genitourinary: Negative.   Musculoskeletal: Positive for joint pain (bil knees - seeing ortho). Negative for myalgias.  Skin: Negative for rash.  Neurological: Negative for dizziness, tingling, sensory change, weakness and headaches.  Endo/Heme/Allergies: Negative for polydipsia.  Psychiatric/Behavioral: Negative.   All other systems reviewed and are negative.   Physical Exam: Estimated body mass index is 25.69 kg/m as calculated from the following:   Height as of this encounter: 5\' 3"  (1.6 m).   Weight as of this encounter: 145 lb (65.8 kg). BP 104/70   Pulse (!) 57   Temp (!) 97.5 F (36.4 C)   Ht 5\' 3"  (1.6 m)   Wt 145 lb (65.8 kg)   SpO2 98%   BMI 25.69 kg/m  General Appearance: Well nourished, in no apparent distress.  Eyes: PERRLA, EOMs, conjunctiva no swelling or erythema Sinuses: No Frontal/maxillary tenderness  ENT/Mouth: Ext aud canals clear, normal light reflex with TMs without erythema, bulging. Good dentition. No erythema, swelling, or exudate on post pharynx. Tonsils not swollen or erythematous. Hearing normal.  Neck: Supple, thyroid normal. No bruits  Respiratory: Respiratory effort normal, BS equal bilaterally without rales, rhonchi, wheezing or stridor.  Cardio: RRR without murmurs, rubs or gallops. Brisk peripheral pulses without  edema.  Chest: symmetric, with normal excursions and percussion.  Breasts: Defer to GYN Abdomen: Soft, nontender, no guarding, rebound, hernias, masses, or organomegaly.  Lymphatics: Non tender without lymphadenopathy.  Genitourinary: Defer to GYN Musculoskeletal: Full ROM all peripheral extremities,5/5 strength, and normal gait.  Skin: Warm, dry without rashes, lesions, ecchymosis. Neuro: Cranial nerves intact, reflexes equal bilaterally. Normal muscle tone, no cerebellar symptoms. Sensation intact.  Psych: Awake and oriented X 3, normal affect, Insight and Judgment appropriate.   EKG: Sinus brady 49 BPM, RSR' V1, V2, IRBBB  [place] 10:32 AM [facility]

## 2020-02-11 ENCOUNTER — Ambulatory Visit: Payer: BC Managed Care – PPO | Admitting: Adult Health

## 2020-02-11 ENCOUNTER — Encounter: Payer: Self-pay | Admitting: Adult Health

## 2020-02-11 ENCOUNTER — Other Ambulatory Visit: Payer: Self-pay

## 2020-02-11 VITALS — BP 104/70 | HR 57 | Temp 97.5°F | Ht 63.0 in | Wt 145.0 lb

## 2020-02-11 DIAGNOSIS — Z79899 Other long term (current) drug therapy: Secondary | ICD-10-CM

## 2020-02-11 DIAGNOSIS — E538 Deficiency of other specified B group vitamins: Secondary | ICD-10-CM | POA: Insufficient documentation

## 2020-02-11 DIAGNOSIS — Z1389 Encounter for screening for other disorder: Secondary | ICD-10-CM

## 2020-02-11 DIAGNOSIS — Z0000 Encounter for general adult medical examination without abnormal findings: Secondary | ICD-10-CM | POA: Diagnosis not present

## 2020-02-11 DIAGNOSIS — Z8249 Family history of ischemic heart disease and other diseases of the circulatory system: Secondary | ICD-10-CM

## 2020-02-11 DIAGNOSIS — E785 Hyperlipidemia, unspecified: Secondary | ICD-10-CM

## 2020-02-11 DIAGNOSIS — N200 Calculus of kidney: Secondary | ICD-10-CM

## 2020-02-11 DIAGNOSIS — Z130 Encounter for screening for diseases of the blood and blood-forming organs and certain disorders involving the immune mechanism: Secondary | ICD-10-CM | POA: Diagnosis not present

## 2020-02-11 DIAGNOSIS — R030 Elevated blood-pressure reading, without diagnosis of hypertension: Secondary | ICD-10-CM

## 2020-02-11 DIAGNOSIS — Z13220 Encounter for screening for lipoid disorders: Secondary | ICD-10-CM | POA: Diagnosis not present

## 2020-02-11 DIAGNOSIS — K589 Irritable bowel syndrome without diarrhea: Secondary | ICD-10-CM

## 2020-02-11 DIAGNOSIS — Z6824 Body mass index (BMI) 24.0-24.9, adult: Secondary | ICD-10-CM

## 2020-02-11 DIAGNOSIS — Z87898 Personal history of other specified conditions: Secondary | ICD-10-CM

## 2020-02-11 DIAGNOSIS — E559 Vitamin D deficiency, unspecified: Secondary | ICD-10-CM | POA: Diagnosis not present

## 2020-02-11 DIAGNOSIS — G720 Drug-induced myopathy: Secondary | ICD-10-CM

## 2020-02-11 DIAGNOSIS — J45909 Unspecified asthma, uncomplicated: Secondary | ICD-10-CM

## 2020-02-11 DIAGNOSIS — G8929 Other chronic pain: Secondary | ICD-10-CM

## 2020-02-11 DIAGNOSIS — R001 Bradycardia, unspecified: Secondary | ICD-10-CM

## 2020-02-11 DIAGNOSIS — K829 Disease of gallbladder, unspecified: Secondary | ICD-10-CM

## 2020-02-11 DIAGNOSIS — Z131 Encounter for screening for diabetes mellitus: Secondary | ICD-10-CM

## 2020-02-11 DIAGNOSIS — K219 Gastro-esophageal reflux disease without esophagitis: Secondary | ICD-10-CM

## 2020-02-11 DIAGNOSIS — Z6825 Body mass index (BMI) 25.0-25.9, adult: Secondary | ICD-10-CM

## 2020-02-11 DIAGNOSIS — Z1329 Encounter for screening for other suspected endocrine disorder: Secondary | ICD-10-CM | POA: Diagnosis not present

## 2020-02-11 DIAGNOSIS — M25561 Pain in right knee: Secondary | ICD-10-CM

## 2020-02-11 DIAGNOSIS — Z136 Encounter for screening for cardiovascular disorders: Secondary | ICD-10-CM

## 2020-02-11 NOTE — Patient Instructions (Addendum)
[name] , Thank you for taking time to come for your Annual Wellness Visit. I appreciate your ongoing commitment to your health goals. Please review the following plan we discussed and let me know if I can assist you in the future.   These are the goals we discussed: Goals    . DIET - INCREASE WATER INTAKE     65+ fluid ounces daily       This is a list of the screening recommended for you and due dates:  Health Maintenance  Topic Date Due  .  Hepatitis C: One time screening is recommended by Center for Disease Control  (CDC) for  adults born from [birth year] through 1965.   Never done  . HIV Screening  Never done  . Pap Smear  03/13/2018  . Tetanus Vaccine  09/18/2022  . Flu Shot  Completed  . COVID-19 Vaccine  Completed       Fiber fueled - Will Bulsiwicz   Please check with GYN when your last PAP was and have them forward reports for this  Azelaic acid 10 % 1-2 times daily (wear sunscreen during the day)  The ordinary - 10% suspension Naturium - 10% emulsion   I also find la roche posay adapalene gel at night to be very helpful   Be sure to avoid comedogenic products, moisturize well - particularly during the winter - cerave has lots of great moisturizers with niacinamide    Know what a healthy weight is for you (roughly BMI <25) and aim to maintain this  Aim for 7+ servings of fruits and vegetables daily  65-80+ fluid ounces of water or unsweet tea for healthy kidneys  Limit to max 1 drink of alcohol per day; avoid smoking/tobacco  Limit animal fats in diet for cholesterol and heart health - choose grass fed whenever available  Avoid highly processed foods, and foods high in saturated/trans fats  Aim for low stress - take time to unwind and care for your mental health  Aim for 150 min of moderate intensity exercise weekly for heart health, and weights twice weekly for bone health  Aim for 7-9 hours of sleep daily       High-Fiber Diet Fiber, also called  dietary fiber, is a type of carbohydrate that is found in fruits, vegetables, whole grains, and beans. A high-fiber diet can have many health benefits. Your health care provider may recommend a high-fiber diet to help:  Prevent constipation. Fiber can make your bowel movements more regular.  Lower your cholesterol.  Relieve the following conditions: ? Swelling of veins in the anus (hemorrhoids). ? Swelling and irritation (inflammation) of specific areas of the digestive tract (uncomplicated diverticulosis). ? A problem of the large intestine (colon) that sometimes causes pain and diarrhea (irritable bowel syndrome, IBS).  Prevent overeating as part of a weight-loss plan.  Prevent heart disease, type 2 diabetes, and certain cancers. What is my plan? The recommended daily fiber intake in grams (g) includes:  38 g for men age 12 or younger.  30 g for men over age 81.  59 g for women age 9 or younger.  21 g for women over age 17. You can get the recommended daily intake of dietary fiber by:  Eating a variety of fruits, vegetables, grains, and beans.  Taking a fiber supplement, if it is not possible to get enough fiber through your diet. What do I need to know about a high-fiber diet?  It is better to get  fiber through food sources rather than from fiber supplements. There is not a lot of research about how effective supplements are.  Always check the fiber content on the nutrition facts label of any prepackaged food. Look for foods that contain 5 g of fiber or more per serving.  Talk with a diet and nutrition specialist (dietitian) if you have questions about specific foods that are recommended or not recommended for your medical condition, especially if those foods are not listed below.  Gradually increase how much fiber you consume. If you increase your intake of dietary fiber too quickly, you may have bloating, cramping, or gas.  Drink plenty of water. Water helps you to digest  fiber. What are tips for following this plan?  Eat a wide variety of high-fiber foods.  Make sure that half of the grains that you eat each day are whole grains.  Eat breads and cereals that are made with whole-grain flour instead of refined flour or white flour.  Eat brown rice, bulgur wheat, or millet instead of white rice.  Start the day with a breakfast that is high in fiber, such as a cereal that contains 5 g of fiber or more per serving.  Use beans in place of meat in soups, salads, and pasta dishes.  Eat high-fiber snacks, such as berries, raw vegetables, nuts, and popcorn.  Choose whole fruits and vegetables instead of processed forms like juice or sauce. What foods can I eat?  Fruits Berries. Pears. Apples. Oranges. Avocado. Prunes and raisins. Dried figs. Vegetables Sweet potatoes. Spinach. Kale. Artichokes. Cabbage. Broccoli. Cauliflower. Green peas. Carrots. Squash. Grains Whole-grain breads. Multigrain cereal. Oats and oatmeal. Brown rice. Barley. Bulgur wheat. [place]. Quinoa. Bran muffins. Popcorn. Rye wafer crackers. Meats and other proteins Navy, kidney, and pinto beans. Soybeans. Split peas. Lentils. Nuts and seeds. Dairy Fiber-fortified yogurt. Beverages Fiber-fortified soy milk. Fiber-fortified orange juice. Other foods Fiber bars. The items listed above may not be a complete list of recommended foods and beverages. Contact a dietitian for more options. What foods are not recommended? Fruits Fruit juice. Cooked, strained fruit. Vegetables Fried potatoes. Canned vegetables. Well-cooked vegetables. Grains White bread. Pasta made with refined flour. White rice. Meats and other proteins Fatty cuts of meat. Fried chicken or fried fish. Dairy Milk. Yogurt. Cream cheese. Sour cream. Fats and oils Butters. Beverages Soft drinks. Other foods Cakes and pastries. The items listed above may not be a complete list of foods and beverages to avoid. Contact a  dietitian for more information. Summary  Fiber is a type of carbohydrate. It is found in fruits, vegetables, whole grains, and beans.  There are many health benefits of eating a high-fiber diet, such as preventing constipation, lowering blood cholesterol, helping with weight loss, and reducing your risk of heart disease, diabetes, and certain cancers.  Gradually increase your intake of fiber. Increasing too fast can result in cramping, bloating, and gas. Drink plenty of water while you increase your fiber.  The best sources of fiber include whole fruits and vegetables, whole grains, nuts, seeds, and beans. This information is not intended to replace advice given to you by your health care provider. Make sure you discuss any questions you have with your health care provider. Document Revised: 01/01/2017 Document Reviewed: 01/01/2017 Elsevier Patient Education  2020 [employer].

## 2020-02-12 LAB — CBC WITH DIFFERENTIAL/PLATELET
Absolute Monocytes: 502 cells/uL (ref 200–950)
Basophils Absolute: 38 cells/uL (ref 0–200)
Basophils Relative: 0.5 %
Eosinophils Absolute: 68 cells/uL (ref 15–500)
Eosinophils Relative: 0.9 %
HCT: 44.1 % (ref 35.0–45.0)
Hemoglobin: 14.7 g/dL (ref 11.7–15.5)
Lymphs Abs: 1452 cells/uL (ref 850–3900)
MCH: 29.4 pg (ref 27.0–33.0)
MCHC: 33.3 g/dL (ref 32.0–36.0)
MCV: 88.2 fL (ref 80.0–100.0)
MPV: 10.2 fL (ref 7.5–12.5)
Monocytes Relative: 6.6 %
Neutro Abs: 5540 cells/uL (ref 1500–7800)
Neutrophils Relative %: 72.9 %
Platelets: 285 10*3/uL (ref 140–400)
RBC: 5 10*6/uL (ref 3.80–5.10)
RDW: 12.1 % (ref 11.0–15.0)
Total Lymphocyte: 19.1 %
WBC: 7.6 10*3/uL (ref 3.8–10.8)

## 2020-02-12 LAB — COMPLETE METABOLIC PANEL WITH GFR
AG Ratio: 2.2 (calc) (ref 1.0–2.5)
ALT: 22 U/L (ref 6–29)
AST: 21 U/L (ref 10–35)
Albumin: 4.7 g/dL (ref 3.6–5.1)
Alkaline phosphatase (APISO): 62 U/L (ref 31–125)
BUN: 18 mg/dL (ref 7–25)
CO2: 23 mmol/L (ref 20–32)
Calcium: 9.3 mg/dL (ref 8.6–10.2)
Chloride: 107 mmol/L (ref 98–110)
Creat: 0.66 mg/dL (ref 0.50–1.10)
GFR, Est African American: 120 mL/min/{1.73_m2} (ref >=60)
GFR, Est Non African American: 104 mL/min/{1.73_m2} (ref >=60)
Globulin: 2.1 g/dL (calc) (ref 1.9–3.7)
Glucose, Bld: 84 mg/dL (ref 65–99)
Potassium: 4.8 mmol/L (ref 3.5–5.3)
Sodium: 139 mmol/L (ref 135–146)
Total Bilirubin: 0.4 mg/dL (ref 0.2–1.2)
Total Protein: 6.8 g/dL (ref 6.1–8.1)

## 2020-02-12 LAB — HEMOGLOBIN A1C
Hgb A1c MFr Bld: 5.3 % of total Hgb (ref <5.7)
Mean Plasma Glucose: 105 (calc)
eAG (mmol/L): 5.8 (calc)

## 2020-02-12 LAB — URINALYSIS, ROUTINE W REFLEX MICROSCOPIC
Bilirubin Urine: NEGATIVE
Glucose, UA: NEGATIVE
Hgb urine dipstick: NEGATIVE
Ketones, ur: NEGATIVE
Leukocytes,Ua: NEGATIVE
Nitrite: NEGATIVE
Protein, ur: NEGATIVE
Specific Gravity, Urine: 1.011 (ref 1.001–1.03)
pH: 5.5 (ref 5.0–8.0)

## 2020-02-12 LAB — LIPID PANEL
Cholesterol: 188 mg/dL (ref <200)
HDL: 61 mg/dL (ref >=50)
LDL Cholesterol (Calc): 110 mg/dL (calc) — ABNORMAL HIGH
Non-HDL Cholesterol (Calc): 127 mg/dL (calc) (ref <130)
Total CHOL/HDL Ratio: 3.1 (calc) (ref <5.0)
Triglycerides: 79 mg/dL (ref <150)

## 2020-02-12 LAB — VITAMIN D 25 HYDROXY (VIT D DEFICIENCY, FRACTURES): Vit D, 25-Hydroxy: 60 ng/mL (ref 30–100)

## 2020-02-12 LAB — VITAMIN B12: Vitamin B-12: 679 pg/mL (ref 200–1100)

## 2020-02-12 LAB — MAGNESIUM: Magnesium: 2.3 mg/dL (ref 1.5–2.5)

## 2020-02-12 LAB — TSH: TSH: 1.42 mIU/L

## 2020-02-20 ENCOUNTER — Other Ambulatory Visit: Payer: Self-pay | Admitting: Internal Medicine

## 2020-02-20 DIAGNOSIS — B3731 Acute candidiasis of vulva and vagina: Secondary | ICD-10-CM

## 2020-02-20 DIAGNOSIS — B373 Candidiasis of vulva and vagina: Secondary | ICD-10-CM

## 2020-02-20 MED ORDER — FLUCONAZOLE 150 MG PO TABS: 150.0000 mg | ORAL_TABLET | Freq: Once | ORAL | 3 refills | Status: DC

## 2020-03-03 ENCOUNTER — Other Ambulatory Visit: Payer: Self-pay | Admitting: Internal Medicine

## 2020-03-03 DIAGNOSIS — E782 Mixed hyperlipidemia: Secondary | ICD-10-CM

## 2020-03-31 ENCOUNTER — Other Ambulatory Visit: Payer: Self-pay | Admitting: Internal Medicine

## 2020-04-06 ENCOUNTER — Other Ambulatory Visit: Payer: Self-pay | Admitting: Internal Medicine

## 2020-04-22 ENCOUNTER — Other Ambulatory Visit: Payer: Self-pay | Admitting: Internal Medicine

## 2020-04-22 DIAGNOSIS — E782 Mixed hyperlipidemia: Secondary | ICD-10-CM

## 2020-05-08 ENCOUNTER — Other Ambulatory Visit: Payer: Self-pay | Admitting: Adult Health

## 2020-05-08 MED ORDER — NYSTATIN-TRIAMCINOLONE 100000-0.1 UNIT/GM-% EX OINT: 1.0000 "application " | TOPICAL_OINTMENT | Freq: Two times a day (BID) | CUTANEOUS | 0 refills | Status: DC

## 2020-05-11 ENCOUNTER — Other Ambulatory Visit: Payer: Self-pay

## 2020-05-11 MED ORDER — HYDROCORTISONE (PERIANAL) 2.5 % EX CREA: 1.0000 "application " | TOPICAL_CREAM | CUTANEOUS | 0 refills | Status: DC | PRN

## 2020-05-20 ENCOUNTER — Other Ambulatory Visit: Payer: Self-pay | Admitting: Internal Medicine

## 2020-05-20 DIAGNOSIS — E782 Mixed hyperlipidemia: Secondary | ICD-10-CM

## 2020-06-24 ENCOUNTER — Other Ambulatory Visit: Payer: Self-pay | Admitting: Adult Health

## 2020-06-24 MED ORDER — AMOXICILLIN-POT CLAVULANATE 875-125 MG PO TABS: 1.0000 | ORAL_TABLET | Freq: Two times a day (BID) | ORAL | 0 refills | Status: AC

## 2020-06-25 ENCOUNTER — Other Ambulatory Visit: Payer: Self-pay | Admitting: Internal Medicine

## 2020-07-05 ENCOUNTER — Other Ambulatory Visit: Payer: Self-pay | Admitting: Adult Health

## 2020-07-06 ENCOUNTER — Other Ambulatory Visit: Payer: Self-pay | Admitting: Internal Medicine

## 2020-07-06 ENCOUNTER — Other Ambulatory Visit: Payer: Self-pay | Admitting: Adult Health

## 2020-07-06 DIAGNOSIS — K581 Irritable bowel syndrome with constipation: Secondary | ICD-10-CM

## 2020-07-06 MED ORDER — LINACLOTIDE 145 MCG PO CAPS: ORAL_CAPSULE | ORAL | 3 refills | Status: DC

## 2020-08-11 ENCOUNTER — Ambulatory Visit: Payer: BC Managed Care – PPO | Admitting: Adult Health

## 2020-08-25 ENCOUNTER — Other Ambulatory Visit: Payer: Self-pay | Admitting: Adult Health

## 2020-08-25 MED ORDER — KETOCONAZOLE 2 % EX CREA
1.0000 | TOPICAL_CREAM | Freq: Two times a day (BID) | CUTANEOUS | 0 refills | Status: DC
Start: 2020-08-25 — End: 2021-02-19

## 2020-09-18 ENCOUNTER — Other Ambulatory Visit: Payer: Self-pay | Admitting: Internal Medicine

## 2020-09-18 DIAGNOSIS — E782 Mixed hyperlipidemia: Secondary | ICD-10-CM

## 2020-09-28 ENCOUNTER — Other Ambulatory Visit: Payer: Self-pay

## 2020-09-28 MED ORDER — MELOXICAM 15 MG PO TABS: ORAL_TABLET | ORAL | 0 refills | Status: DC

## 2021-02-01 ENCOUNTER — Other Ambulatory Visit: Payer: Self-pay

## 2021-02-01 MED ORDER — MELOXICAM 15 MG PO TABS: ORAL_TABLET | ORAL | 0 refills | Status: DC

## 2021-02-10 ENCOUNTER — Encounter: Payer: BC Managed Care – PPO | Admitting: Adult Health

## 2021-02-10 NOTE — Progress Notes (Signed)
Complete Physical  Assessment and Plan:  [name] was seen today for annual exam.  Diagnoses and all orders for this visit:  Encounter for Annual Physical Exam with abnormal findings Due annually  Health Maintenance reviewed Healthy lifestyle reviewed and goals set Get shingrix at pharmacy  GI referral for colonoscopy  Requested GYN reports once complete Discussed pneumonia vaccine but declines this year  Uncomplicated asthma, unspecified asthma severity, unspecified whether persistent Controlled without recent flares Avoid triggers, continue medications  Irritable bowel syndrome, constipation Well controlled with current regimen, discussed increasing soluble fiber intake  continue linzess  Gall bladder disease No symptoms, monitor   Renal stone None recent, monitor, push water intake Has seen urology [name] in 2016  Vitamin D deficiency -     VITAMIN D 25 Hydroxy (Vit-D Deficiency, Fractures)  BMI 27 Continue to recommend diet heavy in fruits and veggies and low in animal meats, cheeses, and dairy products, appropriate calorie intake Discuss exercise recommendations routinely Continue to monitor weight at each visit  Hx of prediabetes Discussed disease and risks Discussed diet/exercise, weight management  -     COMPLETE METABOLIC PANEL WITH GFR -     Hemoglobin A1c -     Urinalysis, Routine w reflex microscopic  Medication management -     CBC with Differential/Platelet -     COMPLETE METABOLIC PANEL WITH GFR -     Magnesium -     Urinalysis, Routine w reflex microscopic  Hyperlipidemia, unspecified hyperlipidemia type/ statin myopathy Continue medications Continue low cholesterol diet and exercise.  Check lipid panel.  -     Lipid panel -     TSH  Gastroesophageal reflux disease, esophagitis presence not specified Try OTC nexium at night x 2 weeks, monitor throat sx Contact office if this improves Continue famotidine in AM Discussed diet, avoiding  triggers and other lifestyle changes -     Magnesium  Screening for cardiovascular condition -     EKG 12-Lead  Bil knee pain NSAID PRN, established with ortho  No orders of the defined types were placed in this encounter.    Discussed med's effects and SE's. Screening labs and tests as requested with regular follow-up as recommended. Over 40 minutes of exam, counseling, chart review, and complex, high level critical decision making was performed this visit.   Future Appointments  Date Time Provider [place]  02/14/2022  9:00 AM Liane Comber, NP [facility] None     HPI  50 y.o. female  presents for a complete physical and follow up for has Asthma; IBS (irritable bowel syndrome); Hyperlipidemia; Renal stone; Gall bladder disease; History of prediabetes; Medication management; Vitamin D deficiency; GERD (gastroesophageal reflux disease); History of right lateral epicondylitis; Statin myopathy; Chronic pain of both knees; and B12 deficiency on their problem list.   She is a Software engineer at [employer], married, 2 kids, eldest daughter is in college. Younger is son 5. No new concerns.   Has bil knee pain, now seeing [name] and had steroid injections which have helped but don't last. Also takes meloxicam.    She reports that she does occasionally get cystic acne lesions. She is also prescribed sulfa/sulfer face wash and spironolactone 50 mg and reports significant improvement. Followed by derm and gets annual total body skin check.    She has IBS-C with symptoms stabilized on linzess 72 mcg. Improved with recent diet. Takes pepcid BID for GERD, however does report intermittent AM and post meal voice changes.   She  has asthma well controlled without recent flares on breo.   She does have a first degree relative with breast cancer.  Follows with [facility] (GYN) [name] for PAPs and mammograms.   BMI is Body mass index is 27.32 kg/m., she has been  working on diet and exercise though limited due to knee pain (minimial running).  She is on feet all day and active at work.  She is doing a high protein, 5-6 small meals daily. Trying to include more fruits and veggies.  She drinks minimum 3-4 bottles of water daily  Wt Readings from Last 3 Encounters:  02/11/21 154 lb 3.2 oz (69.9 kg)  02/11/20 145 lb (65.8 kg)  08/19/19 136 lb (61.7 kg)   Her blood pressure has been controlled at home, today their BP is BP: 110/72 She does not workout. She denies chest pain, shortness of breath, dizziness.   She is on cholesterol medication (gemfibrozil, zetia, omega 3) and denies myalgias. Her cholesterol is not at goal. The cholesterol last visit was:   Lab Results  Component Value Date   CHOL 188 02/11/2020   HDL 61 02/11/2020   LDLCALC 110 (H) 02/11/2020   TRIG 79 02/11/2020   CHOLHDL 3.1 02/11/2020   She has been working on diet and exercise for glucose management, and denies increased appetite, nausea, paresthesia of the feet, polydipsia, polyuria, visual disturbances and vomiting. Last A1C in the office was:  Lab Results  Component Value Date   HGBA1C 5.3 02/11/2020   Last GFR: Lab Results  Component Value Date   [facility] 104 02/11/2020   Patient is on Vitamin D supplement, taking ? 5000 IU daily, takes 2000 IU during the summer Lab Results  Component Value Date   VD25OH 60 02/11/2020   She takes B12 cap supplement 3 days a week;  Lab Results  Component Value Date   [id number] 785 02/11/2020     Current Medications:  Current Outpatient Medications on File Prior to Visit  Medication Sig Dispense Refill   albuterol (VENTOLIN HFA) 108 (90 Base) MCG/ACT inhaler Use 2 inhalations 15 minutes apart every 4 hrs as needed to rescue Asthma 48 g 3   aspirin 81 MG tablet Take 81 mg by mouth See admin instructions. Takes 1 tablet on mondays, wednesdays, and fridays     bisoprolol (ZEBETA) 5 MG tablet Take  1 tablet  Daily for BP 90 tablet  3   cholecalciferol (VITAMIN D) 1000 UNITS tablet Take 5,000 Units by mouth daily.      ezetimibe (ZETIA) 10 MG tablet TAKE 1 TABLET BY MOUTH ONCE DAILY FOR CHOLESTEROL 90 tablet 3   famotidine (PEPCID) 20 MG tablet Take 20 mg by mouth 2 (two) times daily.      fluticasone furoate-vilanterol (BREO ELLIPTA) 200-25 MCG/INH AEPB Use  1 inhalation  Daily 180 each 1   gemfibrozil (LOPID) 600 MG tablet Take  1 tablet  2 x /day  with Meals for Triglycerides (Blood Fats) / Patient knows to take by mouth 180 tablet 3   guaiFENesin (MUCINEX) 600 MG 12 hr tablet Take 600 mg by mouth as needed.      hydrocortisone (PROCTOSOL HC) 2.5 % rectal cream Place 1 application rectally as needed for hemorrhoids or anal itching. 30 g 0   linaclotide (LINZESS) 145 MCG CAPS capsule Take  1 capsule  Daily  for IBS - C 90 capsule 3   Magnesium 400 MG CAPS Take 400 mg by mouth daily.  meloxicam (MOBIC) 15 MG tablet Take 1/2 to 1 tablet Daily with Food for Pain & Inflammation & try limit to 4-5 tabs /week to avoid Kidney Damage 90 tablet 0   Olopatadine HCl 0.2 % SOLN Use 1-2 drops per eye daily (Patient taking differently: as needed. Use 1-2 drops per eye daily) 2.5 mL 3   Omega-3 Fatty Acids (FISH OIL) 1000 MG CAPS Take 1 capsule by mouth daily.     SOOLANTRA 1 % CREA      Sulfacetamide Sodium-Sulfur 8-4 % SUSP WASH FACE ONCE OR TWICE DAILY  3   vitamin E 400 UNIT capsule Take 400 Units by mouth daily.      azelastine (ASTELIN) 0.1 % nasal spray Place 2 sprays into both nostrils 2 (two) times daily. Use in each nostril as directed (Patient not taking: Reported on 02/11/2021) 90 mL 3   FLUARIX QUADRIVALENT 0.5 ML injection      ketoconazole (NIZORAL) 2 % cream Apply 1 application topically 2 (two) times daily. (Patient not taking: Reported on 02/11/2021) 15 g 0   spironolactone (ALDACTONE) 50 MG tablet Take 50 mg by mouth daily. (Patient not taking: Reported on 02/11/2021)     No current facility-administered medications  on file prior to visit.   Allergies:  Allergies  Allergen Reactions   Food     strawberries   Pravastatin     Severe myalgias    Medical History:  She has Asthma; IBS (irritable bowel syndrome); Hyperlipidemia; Renal stone; Gall bladder disease; History of prediabetes; Medication management; Vitamin D deficiency; GERD (gastroesophageal reflux disease); History of right lateral epicondylitis; Statin myopathy; Chronic pain of both knees; and B12 deficiency on their problem list. Health Maintenance:   Immunization History  Administered Date(s) Administered   Hepatitis B 09/08/2015, 10/08/2015, 03/08/2016   Influenza Inj Mdck Quad Pf 12/16/2020   Influenza Split 12/05/2016, 11/29/2017, 12/07/2018   Influenza,inj,quad, With Preservative 01/03/2020   Influenza-Unspecified 12/26/2013, 11/26/2014, 12/21/2015   PFIZER(Purple Top)SARS-COV-2 Vaccination 11/08/2019, 11/29/2019   PPD Test 11/25/2013, 12/28/2016   Tdap 09/17/2012    Tetanus: 2014 Flu vaccine: 01/2021 Shingrix: will get next year at work  Covid 19: [place], 2/2, 2021   Pap: 02/13/2019, [facility], goes annually for pelvic  Has upcoming scheduled 04/2021 - req report MGM: 03/2020 at GYN, has scheduled 04/2021 -- req report  Colonoscopy: new guidelines age 52, wants to wait until age 89, no family history EGD:-   Last Dental Exam: [facility], last 2022, goes q21m Last Eye Exam: Dr. Agapito Games, 2022, goes annually, needs to reschedule for this year  Last Derm: Dr. Magnus Sinning, 2021, does annual total body, has upcoming 04/2021  Patient Care Team: Unk Pinto, MD as PCP - General (Internal Medicine) Pyrtle, Lajuan Lines, MD as Referring Physician (Gastroenterology) Jimmey Ralph, NP (Inactive) as Nurse Practitioner (Urology) Everlene Farrier, MD as Consulting Physician (Obstetrics and Gynecology) Druscilla Brownie, MD as Consulting Physician (Dermatology) Inocencio Homes, DPM as  Consulting Physician (Podiatry)  Surgical History:  She has a past surgical history that includes Ectopic pregnancy surgery (2005); Partial hysterectomy (2009); Esophagogastroduodenoscopy (06/13/2011); and Cholecystectomy (07/27/2011). Family History:  Herfamily history includes Breast cancer (age of onset: 1) in her mother; Diabetes type II in her father; Heart Problems in her mother; Hyperlipidemia in her mother and sister; Lymphoma in her mother; Migraines in her mother and sister; Ovarian cancer in her paternal grandmother; Stroke in her maternal grandmother; Valvular heart disease in her mother. Social  History:  She reports that she has never smoked. She has never used smokeless tobacco. She reports that she does not currently use alcohol. She reports that she does not use drugs.  Review of Systems: Review of Systems  Constitutional:  Negative for malaise/fatigue and weight loss.  HENT:  Negative for hearing loss and tinnitus.        AM throat clearing and frogginess  Eyes:  Negative for blurred vision and double vision.  Respiratory:  Negative for cough, shortness of breath and wheezing.   Cardiovascular:  Negative for chest pain, palpitations, orthopnea, claudication and leg swelling.  Gastrointestinal:  Positive for constipation (IBS D, improved with meds, intermittent). Negative for abdominal pain, blood in stool, diarrhea, heartburn, melena, nausea and vomiting.  Genitourinary: Negative.   Musculoskeletal:  Positive for joint pain (bil knees - seeing ortho). Negative for myalgias.  Skin:  Negative for rash.  Neurological:  Negative for dizziness, tingling, sensory change, weakness and headaches.  Endo/Heme/Allergies:  Negative for polydipsia.  Psychiatric/Behavioral: Negative.    All other systems reviewed and are negative.  Physical Exam: Estimated body mass index is 27.32 kg/m as calculated from the following:   Height as of this encounter: 5\' 3"  (1.6 m).   Weight as of this  encounter: 154 lb 3.2 oz (69.9 kg). BP 110/72   Pulse 64   Temp (!) 97.4 F (36.3 C)   Ht 5\' 3"  (1.6 m)   Wt 154 lb 3.2 oz (69.9 kg)   SpO2 98%   BMI 27.32 kg/m  General Appearance: Well nourished, in no apparent distress.  Eyes: PERRLA, EOMs, conjunctiva no swelling or erythema Sinuses: No Frontal/maxillary tenderness  ENT/Mouth: Ext aud canals clear, normal light reflex with TMs without erythema, bulging. Good dentition. No erythema, swelling, or exudate on post pharynx. Tonsils not swollen or erythematous. Hearing normal.  Neck: Supple, thyroid normal. No bruits  Respiratory: Respiratory effort normal, BS equal bilaterally without rales, rhonchi, wheezing or stridor.  Cardio: RRR without murmurs, rubs or gallops. Brisk peripheral pulses without edema.  Chest: symmetric, with normal excursions and percussion.  Breasts: Defer to GYN Abdomen: Soft, nontender, no guarding, rebound, hernias, masses, or organomegaly.  Lymphatics: Non tender without lymphadenopathy.  Genitourinary: Defer to GYN Musculoskeletal: Full ROM all peripheral extremities,5/5 strength, and normal gait.  Skin: Warm, dry without rashes, lesions, ecchymosis. Neuro: Cranial nerves intact, reflexes equal bilaterally. Normal muscle tone, no cerebellar symptoms. Sensation intact.  Psych: Awake and oriented X 3, normal affect, Insight and Judgment appropriate.   EKG: Sinus brady 49 BPM, RSR' V1, V2, IRBBB  Gorden Harms [name] 9:09 AM [facility]

## 2021-02-11 ENCOUNTER — Other Ambulatory Visit: Payer: Self-pay

## 2021-02-11 ENCOUNTER — Encounter: Payer: Self-pay | Admitting: Adult Health

## 2021-02-11 ENCOUNTER — Ambulatory Visit (INDEPENDENT_AMBULATORY_CARE_PROVIDER_SITE_OTHER): Payer: BC Managed Care – PPO | Admitting: Adult Health

## 2021-02-11 VITALS — BP 110/72 | HR 64 | Temp 97.4°F | Ht 63.0 in | Wt 154.2 lb

## 2021-02-11 DIAGNOSIS — Z87898 Personal history of other specified conditions: Secondary | ICD-10-CM

## 2021-02-11 DIAGNOSIS — Z130 Encounter for screening for diseases of the blood and blood-forming organs and certain disorders involving the immune mechanism: Secondary | ICD-10-CM | POA: Diagnosis not present

## 2021-02-11 DIAGNOSIS — K589 Irritable bowel syndrome without diarrhea: Secondary | ICD-10-CM

## 2021-02-11 DIAGNOSIS — Z0000 Encounter for general adult medical examination without abnormal findings: Secondary | ICD-10-CM

## 2021-02-11 DIAGNOSIS — Z79899 Other long term (current) drug therapy: Secondary | ICD-10-CM | POA: Diagnosis not present

## 2021-02-11 DIAGNOSIS — Z8249 Family history of ischemic heart disease and other diseases of the circulatory system: Secondary | ICD-10-CM | POA: Diagnosis not present

## 2021-02-11 DIAGNOSIS — G720 Drug-induced myopathy: Secondary | ICD-10-CM

## 2021-02-11 DIAGNOSIS — K219 Gastro-esophageal reflux disease without esophagitis: Secondary | ICD-10-CM

## 2021-02-11 DIAGNOSIS — J45909 Unspecified asthma, uncomplicated: Secondary | ICD-10-CM

## 2021-02-11 DIAGNOSIS — Z136 Encounter for screening for cardiovascular disorders: Secondary | ICD-10-CM | POA: Diagnosis not present

## 2021-02-11 DIAGNOSIS — M25562 Pain in left knee: Secondary | ICD-10-CM

## 2021-02-11 DIAGNOSIS — Z0001 Encounter for general adult medical examination with abnormal findings: Secondary | ICD-10-CM

## 2021-02-11 DIAGNOSIS — E559 Vitamin D deficiency, unspecified: Secondary | ICD-10-CM | POA: Diagnosis not present

## 2021-02-11 DIAGNOSIS — Z1389 Encounter for screening for other disorder: Secondary | ICD-10-CM | POA: Diagnosis not present

## 2021-02-11 DIAGNOSIS — Z1211 Encounter for screening for malignant neoplasm of colon: Secondary | ICD-10-CM

## 2021-02-11 DIAGNOSIS — N200 Calculus of kidney: Secondary | ICD-10-CM

## 2021-02-11 DIAGNOSIS — Z131 Encounter for screening for diabetes mellitus: Secondary | ICD-10-CM

## 2021-02-11 DIAGNOSIS — Z6825 Body mass index (BMI) 25.0-25.9, adult: Secondary | ICD-10-CM

## 2021-02-11 DIAGNOSIS — E663 Overweight: Secondary | ICD-10-CM

## 2021-02-11 DIAGNOSIS — Z1329 Encounter for screening for other suspected endocrine disorder: Secondary | ICD-10-CM

## 2021-02-11 DIAGNOSIS — Z13220 Encounter for screening for lipoid disorders: Secondary | ICD-10-CM

## 2021-02-11 DIAGNOSIS — E538 Deficiency of other specified B group vitamins: Secondary | ICD-10-CM

## 2021-02-11 DIAGNOSIS — E785 Hyperlipidemia, unspecified: Secondary | ICD-10-CM

## 2021-02-11 DIAGNOSIS — K829 Disease of gallbladder, unspecified: Secondary | ICD-10-CM

## 2021-02-11 DIAGNOSIS — T466X5A Adverse effect of antihyperlipidemic and antiarteriosclerotic drugs, initial encounter: Secondary | ICD-10-CM

## 2021-02-11 DIAGNOSIS — G8929 Other chronic pain: Secondary | ICD-10-CM

## 2021-02-11 NOTE — Patient Instructions (Addendum)
  [name] , Thank you for taking time to come for your Annual Wellness Visit. I appreciate your ongoing commitment to your health goals. Please review the following plan we discussed and let me know if I can assist you in the future.   These are the goals we discussed:  Goals      DIET - INCREASE WATER INTAKE     65+ fluid ounces daily     Weight (lb) < 145 lb (65.8 kg)     Consider water aerobics or yoga for low impact strengthening        This is a list of the screening recommended for you and due dates:  Health Maintenance  Topic Date Due   Zoster (Shingles) Vaccine (1 of 2) Never done   Colon Cancer Screening  Never done   Pap Smear  03/13/2018   Mammogram  Never done   COVID-19 Vaccine (3 - Pfizer risk series) 02/27/2021*   Pneumococcal Vaccination (1 - PCV) 02/11/2022*   Tetanus Vaccine  09/18/2022   Flu Shot  Completed   HPV Vaccine  Aged Out   Hepatitis C Screening: USPSTF Recommendation to screen - Ages 18-79 yo.  Discontinued   HIV Screening  Discontinued  *Topic was postponed. The date shown is not the original due date.     Know what a healthy weight is for you (roughly BMI <25) and aim to maintain this  Aim for 7+ servings of fruits and vegetables daily  65-80+ fluid ounces of water or unsweet tea for healthy kidneys  Limit to max 1 drink of alcohol per day; avoid smoking/tobacco  Limit animal fats in diet for cholesterol and heart health - choose grass fed whenever available  Avoid highly processed foods, and foods high in saturated/trans fats  Aim for low stress - take time to unwind and care for your mental health  Aim for 150 min of moderate intensity exercise weekly for heart health, and weights twice weekly for bone health  Aim for 7-9 hours of sleep daily   A great goal to work towards is aiming to get in a serving daily of some of the most nutritionally dense foods - G- BOMBS daily

## 2021-02-12 LAB — URINALYSIS, ROUTINE W REFLEX MICROSCOPIC
Bilirubin Urine: NEGATIVE
Glucose, UA: NEGATIVE
Hgb urine dipstick: NEGATIVE
Ketones, ur: NEGATIVE
Leukocytes,Ua: NEGATIVE
Nitrite: NEGATIVE
Protein, ur: NEGATIVE
Specific Gravity, Urine: 1.007 (ref 1.001–1.035)
pH: 5.5 (ref 5.0–8.0)

## 2021-02-12 LAB — COMPLETE METABOLIC PANEL WITH GFR
AG Ratio: 2.1 (calc) (ref 1.0–2.5)
ALT: 14 U/L (ref 6–29)
AST: 14 U/L (ref 10–35)
Albumin: 4.5 g/dL (ref 3.6–5.1)
Alkaline phosphatase (APISO): 64 U/L (ref 37–153)
BUN: 20 mg/dL (ref 7–25)
CO2: 22 mmol/L (ref 20–32)
Calcium: 9.1 mg/dL (ref 8.6–10.4)
Chloride: 105 mmol/L (ref 98–110)
Creat: 0.73 mg/dL (ref 0.50–1.03)
Globulin: 2.1 g/dL (calc) (ref 1.9–3.7)
Glucose, Bld: 86 mg/dL (ref 65–99)
Potassium: 4.5 mmol/L (ref 3.5–5.3)
Sodium: 138 mmol/L (ref 135–146)
Total Bilirubin: 0.6 mg/dL (ref 0.2–1.2)
Total Protein: 6.6 g/dL (ref 6.1–8.1)
eGFR: 100 mL/min/{1.73_m2} (ref >=60)

## 2021-02-12 LAB — VITAMIN B12: Vitamin B-12: 565 pg/mL (ref 200–1100)

## 2021-02-12 LAB — CBC WITH DIFFERENTIAL/PLATELET
Absolute Monocytes: 451 cells/uL (ref 200–950)
Basophils Absolute: 41 cells/uL (ref 0–200)
Basophils Relative: 0.5 %
Eosinophils Absolute: 107 cells/uL (ref 15–500)
Eosinophils Relative: 1.3 %
HCT: 44.8 % (ref 35.0–45.0)
Hemoglobin: 14.6 g/dL (ref 11.7–15.5)
Lymphs Abs: 1542 cells/uL (ref 850–3900)
MCH: 28.6 pg (ref 27.0–33.0)
MCHC: 32.6 g/dL (ref 32.0–36.0)
MCV: 87.8 fL (ref 80.0–100.0)
MPV: 10.5 fL (ref 7.5–12.5)
Monocytes Relative: 5.5 %
Neutro Abs: 6060 cells/uL (ref 1500–7800)
Neutrophils Relative %: 73.9 %
Platelets: 317 10*3/uL (ref 140–400)
RBC: 5.1 10*6/uL (ref 3.80–5.10)
RDW: 12.4 % (ref 11.0–15.0)
Total Lymphocyte: 18.8 %
WBC: 8.2 10*3/uL (ref 3.8–10.8)

## 2021-02-12 LAB — LIPID PANEL
Cholesterol: 200 mg/dL — ABNORMAL HIGH (ref <200)
HDL: 53 mg/dL (ref >=50)
LDL Cholesterol (Calc): 129 mg/dL (calc) — ABNORMAL HIGH
Non-HDL Cholesterol (Calc): 147 mg/dL (calc) — ABNORMAL HIGH (ref <130)
Total CHOL/HDL Ratio: 3.8 (calc) (ref <5.0)
Triglycerides: 82 mg/dL (ref <150)

## 2021-02-12 LAB — MICROALBUMIN / CREATININE URINE RATIO
Creatinine, Urine: 30 mg/dL (ref 20–275)
Microalb, Ur: <0.2 mg/dL

## 2021-02-12 LAB — MAGNESIUM: Magnesium: 2.4 mg/dL (ref 1.5–2.5)

## 2021-02-12 LAB — VITAMIN D 25 HYDROXY (VIT D DEFICIENCY, FRACTURES): Vit D, 25-Hydroxy: 49 ng/mL (ref 30–100)

## 2021-02-12 LAB — TSH: TSH: 1.15 mIU/L

## 2021-02-12 LAB — HEMOGLOBIN A1C
Hgb A1c MFr Bld: 5.5 % of total Hgb (ref <5.7)
Mean Plasma Glucose: 111 mg/dL
eAG (mmol/L): 6.2 mmol/L

## 2021-02-18 ENCOUNTER — Encounter: Payer: Self-pay | Admitting: Adult Health

## 2021-02-18 ENCOUNTER — Other Ambulatory Visit: Payer: Self-pay | Admitting: Internal Medicine

## 2021-02-18 DIAGNOSIS — K219 Gastro-esophageal reflux disease without esophagitis: Secondary | ICD-10-CM

## 2021-02-19 ENCOUNTER — Other Ambulatory Visit: Payer: Self-pay | Admitting: Internal Medicine

## 2021-02-19 ENCOUNTER — Encounter: Payer: Self-pay | Admitting: Internal Medicine

## 2021-02-19 ENCOUNTER — Other Ambulatory Visit: Payer: Self-pay | Admitting: Adult Health

## 2021-02-19 DIAGNOSIS — K219 Gastro-esophageal reflux disease without esophagitis: Secondary | ICD-10-CM

## 2021-02-19 MED ORDER — PANTOPRAZOLE SODIUM 40 MG PO TBEC: DELAYED_RELEASE_TABLET | ORAL | 3 refills | Status: DC

## 2021-02-25 MED ORDER — PANTOPRAZOLE SODIUM 40 MG PO TBEC: DELAYED_RELEASE_TABLET | ORAL | 3 refills | Status: DC

## 2021-03-11 DIAGNOSIS — J028 Acute pharyngitis due to other specified organisms: Secondary | ICD-10-CM | POA: Diagnosis not present

## 2021-03-11 DIAGNOSIS — J029 Acute pharyngitis, unspecified: Secondary | ICD-10-CM | POA: Diagnosis not present

## 2021-03-11 DIAGNOSIS — J358 Other chronic diseases of tonsils and adenoids: Secondary | ICD-10-CM | POA: Diagnosis not present

## 2021-03-21 ENCOUNTER — Other Ambulatory Visit: Payer: Self-pay | Admitting: Internal Medicine

## 2021-03-21 ENCOUNTER — Encounter: Payer: Self-pay | Admitting: Internal Medicine

## 2021-03-21 MED ORDER — FLUTICASONE-SALMETEROL 500-50 MCG/ACT IN AEPB: INHALATION_SPRAY | RESPIRATORY_TRACT | 3 refills | Status: DC

## 2021-04-13 DIAGNOSIS — E8941 Symptomatic postprocedural ovarian failure: Secondary | ICD-10-CM | POA: Diagnosis not present

## 2021-04-13 DIAGNOSIS — Z01419 Encounter for gynecological examination (general) (routine) without abnormal findings: Secondary | ICD-10-CM | POA: Diagnosis not present

## 2021-04-13 DIAGNOSIS — Z6827 Body mass index (BMI) 27.0-27.9, adult: Secondary | ICD-10-CM | POA: Diagnosis not present

## 2021-04-13 DIAGNOSIS — Z1231 Encounter for screening mammogram for malignant neoplasm of breast: Secondary | ICD-10-CM | POA: Diagnosis not present

## 2021-04-26 DIAGNOSIS — R635 Abnormal weight gain: Secondary | ICD-10-CM | POA: Diagnosis not present

## 2021-04-28 ENCOUNTER — Other Ambulatory Visit: Payer: Self-pay

## 2021-04-28 MED ORDER — MELOXICAM 15 MG PO TABS: ORAL_TABLET | ORAL | 0 refills | Status: DC

## 2021-05-17 ENCOUNTER — Encounter: Payer: Self-pay | Admitting: Adult Health

## 2021-05-17 ENCOUNTER — Other Ambulatory Visit: Payer: Self-pay | Admitting: Adult Health

## 2021-05-17 MED ORDER — MUPIROCIN CALCIUM 2 % NA OINT: 1.0000 "application " | TOPICAL_OINTMENT | Freq: Two times a day (BID) | NASAL | 0 refills | Status: DC

## 2021-05-25 DIAGNOSIS — L814 Other melanin hyperpigmentation: Secondary | ICD-10-CM | POA: Diagnosis not present

## 2021-05-25 DIAGNOSIS — D225 Melanocytic nevi of trunk: Secondary | ICD-10-CM | POA: Diagnosis not present

## 2021-05-25 DIAGNOSIS — L710 Perioral dermatitis: Secondary | ICD-10-CM | POA: Diagnosis not present

## 2021-05-25 DIAGNOSIS — L821 Other seborrheic keratosis: Secondary | ICD-10-CM | POA: Diagnosis not present

## 2021-05-29 ENCOUNTER — Other Ambulatory Visit: Payer: Self-pay | Admitting: Adult Health

## 2021-05-29 DIAGNOSIS — E782 Mixed hyperlipidemia: Secondary | ICD-10-CM

## 2021-05-30 ENCOUNTER — Encounter: Payer: Self-pay | Admitting: Adult Health

## 2021-06-13 ENCOUNTER — Other Ambulatory Visit: Payer: Self-pay | Admitting: Internal Medicine

## 2021-06-13 DIAGNOSIS — J4541 Moderate persistent asthma with (acute) exacerbation: Secondary | ICD-10-CM

## 2021-06-15 ENCOUNTER — Encounter: Payer: Self-pay | Admitting: Internal Medicine

## 2021-06-16 DIAGNOSIS — J209 Acute bronchitis, unspecified: Secondary | ICD-10-CM | POA: Diagnosis not present

## 2021-06-17 ENCOUNTER — Other Ambulatory Visit: Payer: Self-pay | Admitting: Internal Medicine

## 2021-06-17 DIAGNOSIS — K581 Irritable bowel syndrome with constipation: Secondary | ICD-10-CM

## 2021-06-17 MED ORDER — LINACLOTIDE 145 MCG PO CAPS: ORAL_CAPSULE | ORAL | 3 refills | Status: DC

## 2021-06-23 ENCOUNTER — Other Ambulatory Visit: Payer: Self-pay | Admitting: Internal Medicine

## 2021-06-23 ENCOUNTER — Encounter: Payer: Self-pay | Admitting: Adult Health

## 2021-06-23 MED ORDER — DEXAMETHASONE 4 MG PO TABS: ORAL_TABLET | ORAL | 1 refills | Status: DC

## 2021-06-28 ENCOUNTER — Other Ambulatory Visit: Payer: Self-pay | Admitting: Internal Medicine

## 2021-07-05 ENCOUNTER — Emergency Department (HOSPITAL_COMMUNITY)
Admission: EM | Admit: 2021-07-05 | Discharge: 2021-07-05 | Disposition: A | Discharge status: Home / Self Care | Payer: BC Managed Care – PPO | Attending: Emergency Medicine | Admitting: Emergency Medicine

## 2021-07-05 ENCOUNTER — Encounter (HOSPITAL_COMMUNITY): Payer: Self-pay

## 2021-07-05 ENCOUNTER — Emergency Department (HOSPITAL_COMMUNITY): Payer: BC Managed Care – PPO

## 2021-07-05 ENCOUNTER — Other Ambulatory Visit: Payer: Self-pay

## 2021-07-05 DIAGNOSIS — K5900 Constipation, unspecified: Secondary | ICD-10-CM | POA: Insufficient documentation

## 2021-07-05 DIAGNOSIS — R140 Abdominal distension (gaseous): Secondary | ICD-10-CM | POA: Diagnosis not present

## 2021-07-05 DIAGNOSIS — Z7982 Long term (current) use of aspirin: Secondary | ICD-10-CM | POA: Insufficient documentation

## 2021-07-05 NOTE — Discharge Instructions (Signed)
You may use milk of magnesia if this helps you.   ? ?Instructions for the bowel cleanout would be using a 238 g bottle of MiraLAX and mixing it with 2 L of Gatorade.  You can drink 1 cup of this every 30 minutes until the mixture is gone.  Follow-up with your primary care provider if you continue to have problems with constipation that recur. ?

## 2021-07-05 NOTE — ED Provider Triage Note (Signed)
Emergency Medicine Provider Triage Evaluation Note ? ?Rolan Bucco , a 51 y.o. female  was evaluated in triage.  Pt complains of abd distension x3 days. Hx of bowel obstruction, and IBS. LBM today, still passing gas. ? ?Review of Systems  ?Positive: Abd distension ?Negative: NV, diarrhea ? ?Physical Exam  ?BP 121/83 (BP Location: Left Arm)   Pulse 64   Temp 98.1 ?F (36.7 ?C) (Oral)   Resp 16   Ht '5\' 3"'$  (1.6 m)   Wt 70.3 kg   SpO2 100%   BMI 27.46 kg/m?  ?Gen:   Awake, no distress   ?Resp:  Normal effort  ?MSK:   Moves extremities without difficulty  ?Other:   ? ?Medical Decision Making  ?Medically screening exam initiated at 8:19 PM.  Appropriate orders placed.  Kimorah L Utecht was informed that the remainder of the evaluation will be completed by another provider, this initial triage assessment does not replace that evaluation, and the importance of remaining in the ED until their evaluation is complete. ? ? ?  ?Rhae Hammock, PA-C ?07/05/21 2020 ? ?

## 2021-07-05 NOTE — ED Triage Notes (Signed)
Pt reports with abdominal cramping and bloating and wants to be sure that she does not have an obstruction.  ?

## 2021-07-05 NOTE — ED Provider Notes (Signed)
?[facility] DEPT ?Provider Note ? ? ?CSN: [id number] ?Arrival date & time: 07/05/21  1924 ? ?  ? ?History ? ?Chief Complaint  ?Patient presents with  ? Bloated  ? ? ?[name] is a 51 y.o. female with a past medical history of bowel obstruction in 2013 presenting today with abdominal distention.  She reports for the past 3 days she has had an increase in her bloating.  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  Still having bowel movements, last bowel movement a few hours ago.  Still passing gas.  No nausea or vomiting.  No fevers or chills.  Says she tried to use enemas and milk of magnesia however this did not relieve her distention.  Says she has a history of IBS however this is mostly under control. ? ? ? ?Home Medications ?Prior to Admission medications   ?Medication Sig Start Date End Date Taking? Authorizing Provider  ?fluticasone-salmeterol (ADVAIR) 500-50 MCG/ACT AEPB Use 1 Inhalation 2 x /day ( every 12 hours ) Asthma maintenance 03/21/21   Unk Pinto, MD  ?albuterol (VENTOLIN HFA) 108 (90 Base) MCG/ACT inhaler INHALE 2 PUFFS 15 MINUTES APART BY MOUTH EVERY 4 HOURS AS NEEDED FOR ASTHMA 06/13/21   Magda Bernheim, NP  ?aspirin 81 MG tablet Take 81 mg by mouth See admin instructions. Takes 1 tablet on mondays, wednesdays, and fridays    [provider]  ?bisoprolol (ZEBETA) 5 MG tablet Take 1 tablet by mouth once daily for blood pressure 06/28/21   Liane Comber, NP  ?cholecalciferol (VITAMIN D) 1000 UNITS tablet Take 5,000 Units by mouth daily.     [provider]  ?dexamethasone (DECADRON) 4 MG tablet Take 1 tab 3 x day - 3 days, then 2 x day - 3 days, then 1 tab daily 06/23/21   Unk Pinto, MD  ?ezetimibe (ZETIA) 10 MG tablet Take  1 tablet  Daily  for Cholesterol                                         /                                      TAKE                   BY                 MOUTH 05/29/21   Unk Pinto,  MD  ?famotidine (PEPCID) 20 MG tablet Take 20 mg by mouth 2 (two) times daily.     [provider]  ?FLUARIX QUADRIVALENT 0.5 ML injection  12/05/16   [provider]  ?gemfibrozil (LOPID) 600 MG tablet Take  1 tablet  2 x /day  with Meals for Triglycerides (Blood Fats) / Patient knows to take by mouth 09/18/20   Unk Pinto, MD  ?guaiFENesin (MUCINEX) 600 MG 12 hr tablet Take 600 mg by mouth as needed.     [provider]  ?hydrocortisone (PROCTOSOL HC) 2.5 % rectal cream Place 1 application rectally as needed for hemorrhoids or anal itching. 05/11/20   Liane Comber, NP  ?linaclotide Rolan Lipa) 145 MCG CAPS capsule Take  1 capsule  Daily  for IBS - C 06/17/21   McKe[place],  Gwyndolyn Saxon, MD  ?Magnesium 400 MG CAPS Take 400 mg by mouth daily.    [provider]  ?meloxicam (MOBIC) 15 MG tablet Take 1/2 to 1 tablet Daily with Food for Pain & Inflammation & try limit to 4-5 tabs /week to avoid Kidney Damage 04/28/21   Magda Bernheim, NP  ?mupirocin nasal ointment (BACTROBAN) 2 % Place 1 application. into the nose 2 (two) times daily. 05/17/21   Liane Comber, NP  ?Olopatadine HCl 0.2 % SOLN Use 1-2 drops per eye daily ?Patient taking differently: as needed. Use 1-2 drops per eye daily 01/04/17   Unk Pinto, MD  ?Omega-3 Fatty Acids (FISH OIL) 1000 MG CAPS Take 1 capsule by mouth daily.    [provider]  ?pantoprazole (PROTONIX) 40 MG tablet Take 1 tablet Daily to Prevent Heartburn & Reflux 02/25/21   Unk Pinto, MD  ?SOOLANTRA 1 % CREA  11/13/16   [provider]  ?Sulfacetamide Sodium-Sulfur 8-4 % SUSP [facility] FACE ONCE OR TWICE DAILY 11/28/16   [provider]  ?vitamin E 400 UNIT capsule Take 400 Units by mouth daily.     [provider]  ?   ? ?Allergies    ?Food and Pravastatin   ? ?Review of Systems   ?Review of Systems ? ?Physical Exam ?Updated Vital Signs ?BP 121/83 (BP Location: Left Arm)   Pulse 64   Temp 98.1 ?F (36.7 ?C) (Oral)   Resp  16   Ht '5\' 3"'$  (1.6 m)   Wt 70.3 kg   SpO2 100%   BMI 27.46 kg/m?  ?Physical Exam ?Vitals and nursing note reviewed.  ?Constitutional:   ?   Appearance: Normal appearance.  ?HENT:  ?   Head: Normocephalic and atraumatic.  ?Eyes:  ?   General: No scleral icterus. ?   Conjunctiva/sclera: Conjunctivae normal.  ?Pulmonary:  ?   Effort: Pulmonary effort is normal. No respiratory distress.  ?Abdominal:  ?   General: Abdomen is flat. There is no distension.  ?   Palpations: Abdomen is soft. There is no mass.  ?   Tenderness: There is no abdominal tenderness. There is no guarding.  ?Skin: ?   Findings: No rash.  ?Neurological:  ?   Mental Status: She is alert.  ?Psychiatric:     ?   Mood and Affect: Mood normal.  ? ? ?ED Results / Procedures / Treatments   ?Labs ?(all labs ordered are listed, but only abnormal results are displayed) ?Labs Reviewed - No data to display ? ?EKG ?None ? ?Radiology ?DG Abdomen 1 View ? ?Result Date: 07/05/2021 ?CLINICAL DATA:  Constipation, IBS. EXAM: ABDOMEN - 1 VIEW COMPARISON:  02/25/2015 FINDINGS: The bowel gas pattern is normal. A moderate amount of retained stool is present in the colon. Cholecystectomy clips are noted in the right upper quadrant. No radio-opaque calculi or other significant radiographic abnormality are seen. IMPRESSION: Nonobstructive bowel-gas pattern. Moderate amount of retained stool in the colon which may be related to constipation. Electronically Signed   By: Brett Fairy M.D.   On: 07/05/2021 20:38   ? ?Procedures ?Procedures  ? ?Medications Ordered in ED ?Medications - No data to display ? ?ED Course/ Medical Decision Making/ A&P ?  ?                        ?Medical Decision Making ?Amount and/or Complexity of Data Reviewed ?Radiology: ordered. ? ? ?This patient presents to the ED for concern of abdominal  bloating.  Differential includes but is not limited to bowel obstruction, fecal impaction, colorectal malignancy, IBS. ?  ?This is not an exhaustive  differential.  ?  ?Past Medical History / Co-morbidities / Social History: ?IBS, bowel obstruction ?  ? ?Physical Exam: ?Physical exam performed. The pertinent findings include: None ? ?Lab Tests: ?Patient's physical exam unremarkable, patient reported still having bowel movement, lab work not indicated ?  ?Imaging Studies: ?I ordered imaging studies including abdominal plain film. I independently visualized and interpreted imaging which showed moderate stool.  However no impaction/obstruction. I agree with the radiologist interpretation. ?  ?Disposition: ?After consideration of the diagnostic results and the patients response to treatment, I feel that the patient is stable for discharge home.  We discussed continued use of over-the-counter remedies and that she may use MiraLAX and Gatorade for a bowel cleanse at home.  She is agreeable to discharge at this time ? ?Final Clinical Impression(s) / ED Diagnoses ?Final diagnoses:  ?Constipation, unspecified constipation type  ? ? ?Rx / DC Orders ?Results and diagnoses were explained to the patient. Return precautions discussed in full. Patient had no additional questions and expressed complete understanding. ? ? ?This chart was dictated using voice recognition software.  Despite best efforts to proofread,  errors can occur which can change the documentation meaning.  ?  ?Rhae Hammock, PA-C ?07/05/21 2124 ? ?  ?Davonna Belling, MD ?07/06/21 0002 ? ?

## 2021-07-07 ENCOUNTER — Other Ambulatory Visit: Payer: Self-pay | Admitting: Adult Health

## 2021-07-07 ENCOUNTER — Encounter: Payer: Self-pay | Admitting: Adult Health

## 2021-07-11 ENCOUNTER — Other Ambulatory Visit: Payer: Self-pay | Admitting: Adult Health Nurse Practitioner

## 2021-07-11 DIAGNOSIS — B3731 Acute candidiasis of vulva and vagina: Secondary | ICD-10-CM

## 2021-07-15 ENCOUNTER — Ambulatory Visit (AMBULATORY_SURGERY_CENTER): Payer: BC Managed Care – PPO | Admitting: *Deleted

## 2021-07-15 ENCOUNTER — Other Ambulatory Visit: Payer: Self-pay | Admitting: Adult Health

## 2021-07-15 VITALS — Ht 63.0 in | Wt 155.0 lb

## 2021-07-15 DIAGNOSIS — Z1211 Encounter for screening for malignant neoplasm of colon: Secondary | ICD-10-CM

## 2021-07-15 DIAGNOSIS — K581 Irritable bowel syndrome with constipation: Secondary | ICD-10-CM

## 2021-07-15 MED ORDER — LINACLOTIDE 290 MCG PO CAPS: ORAL_CAPSULE | ORAL | 3 refills | Status: DC

## 2021-07-15 MED ORDER — NA SULFATE-K SULFATE-MG SULF 17.5-3.13-1.6 GM/177ML PO SOLN: 2.0000 | Freq: Once | ORAL | 0 refills | Status: AC

## 2021-07-15 NOTE — Progress Notes (Signed)
No egg or soy allergy known to patient  ?No issues known to pt with past sedation with any surgeries or procedures ?Patient denies ever being told they had issues or difficulty with intubation  ?No FH of Malignant Hyperthermia ?Pt is not on diet pills ?Pt is not on  home 02  ?Pt is not on blood thinners  ?Pt has constipation issues, encouraged to continue Linzess prior to procedure and given instructions for a 2 day prep with Suprep and Miralax ?No A fib or A flutter ?  ?Pt instructed to use Singlecare.com or GoodRx for a price reduction on prep  ? ?PV completed over the phone. Pt verified name, DOB, address and insurance during PV today.  ?Pt mailed instruction packet with copy of consent form to read and not return, and instructions.  ?Pt encouraged to call with questions or issues.  ?If pt has My chart, procedure instructions sent via My Chart  ?Insurance confirmed with pt at [facility] today   ?

## 2021-07-26 DIAGNOSIS — Z131 Encounter for screening for diabetes mellitus: Secondary | ICD-10-CM | POA: Diagnosis not present

## 2021-07-26 DIAGNOSIS — Z13220 Encounter for screening for lipoid disorders: Secondary | ICD-10-CM | POA: Diagnosis not present

## 2021-07-26 DIAGNOSIS — Z1389 Encounter for screening for other disorder: Secondary | ICD-10-CM | POA: Diagnosis not present

## 2021-07-26 DIAGNOSIS — E559 Vitamin D deficiency, unspecified: Secondary | ICD-10-CM | POA: Diagnosis not present

## 2021-07-26 DIAGNOSIS — Z0000 Encounter for general adult medical examination without abnormal findings: Secondary | ICD-10-CM | POA: Diagnosis not present

## 2021-07-26 DIAGNOSIS — Z113 Encounter for screening for infections with a predominantly sexual mode of transmission: Secondary | ICD-10-CM | POA: Diagnosis not present

## 2021-08-03 ENCOUNTER — Encounter: Payer: Self-pay | Admitting: Internal Medicine

## 2021-08-05 ENCOUNTER — Encounter: Payer: Self-pay | Admitting: Internal Medicine

## 2021-08-05 ENCOUNTER — Other Ambulatory Visit (HOSPITAL_COMMUNITY): Payer: Self-pay

## 2021-08-05 ENCOUNTER — Telehealth: Payer: Self-pay | Admitting: Pharmacy Technician

## 2021-08-05 ENCOUNTER — Ambulatory Visit (AMBULATORY_SURGERY_CENTER): Payer: BC Managed Care – PPO | Admitting: Internal Medicine

## 2021-08-05 VITALS — BP 109/67 | HR 53 | Temp 99.1°F | Resp 11 | Ht 63.0 in | Wt 155.0 lb

## 2021-08-05 DIAGNOSIS — K51418 Inflammatory polyps of colon with other complication: Secondary | ICD-10-CM | POA: Diagnosis not present

## 2021-08-05 DIAGNOSIS — K5140 Inflammatory polyps of colon without complications: Secondary | ICD-10-CM | POA: Diagnosis not present

## 2021-08-05 DIAGNOSIS — D124 Benign neoplasm of descending colon: Secondary | ICD-10-CM

## 2021-08-05 DIAGNOSIS — Z1211 Encounter for screening for malignant neoplasm of colon: Secondary | ICD-10-CM | POA: Diagnosis not present

## 2021-08-05 DIAGNOSIS — K635 Polyp of colon: Secondary | ICD-10-CM

## 2021-08-05 DIAGNOSIS — D120 Benign neoplasm of cecum: Secondary | ICD-10-CM

## 2021-08-05 MED ORDER — TRULANCE 3 MG PO TABS: 3.0000 mg | ORAL_TABLET | Freq: Every day | ORAL | 1 refills | Status: DC

## 2021-08-05 MED ORDER — SODIUM CHLORIDE 0.9 % IV SOLN: 500.0000 mL | Freq: Once | INTRAVENOUS | Status: DC

## 2021-08-05 NOTE — Progress Notes (Unsigned)
I have reviewed the patient's medical history in detail and updated the computerized patient record.    VS BY HC.

## 2021-08-05 NOTE — Telephone Encounter (Signed)
Patient Advocate Encounter  Received notification from [place] that prior authorization for TRULANCE '3MG'$  is required.   PA submitted on 5.26.23 Key [id number] Status is pending   [facility] will continue to follow  Luciano Cutter, CPhT Patient Advocate Phone: [phone]

## 2021-08-05 NOTE — Progress Notes (Unsigned)
GASTROENTEROLOGY PROCEDURE H&P NOTE   Primary Care Physician: Unk Pinto, MD    Reason for Procedure:   Colon cancer screening  Plan:    colonoscopy  Patient is appropriate for endoscopic procedure(s) in the ambulatory ([place]) setting.  The nature of the procedure, as well as the risks, benefits, and alternatives were carefully and thoroughly reviewed with the patient. Ample time for discussion and questions allowed. The patient understood, was satisfied, and agreed to proceed.     HPI: [name] is a 51 y.o. female who presents for screening colonoscopy.  Medical history as below.  Tolerated the prep.  No recent chest pain or shortness of breath.  No abdominal pain today.  Past Medical History:  Diagnosis Date   Acne    Asthma    Chronic kidney disease    Difficulty sleeping    due to discomfort   Elevated cholesterol    GERD (gastroesophageal reflux disease)    HLD (hyperlipidemia)    IBS (irritable bowel syndrome)    Nausea    Statin myopathy 08/18/2019    Past Surgical History:  Procedure Laterality Date   CHOLECYSTECTOMY  07/27/2011   Procedure: LAPAROSCOPIC CHOLECYSTECTOMY WITH INTRAOPERATIVE CHOLANGIOGRAM;  Surgeon: Shann Medal, MD;  Location: [facility];  Service: General;  Laterality: N/A;   ECTOPIC PREGNANCY SURGERY  2005   ESOPHAGOGASTRODUODENOSCOPY  06/13/2011   Procedure: ESOPHAGOGASTRODUODENOSCOPY (EGD);  Surgeon: Jerene Bears, MD;  Location: [facility];  Service: Gastroenterology;  Laterality: N/A;   PARTIAL HYSTERECTOMY  2009    Prior to Admission medications   Medication Sig Start Date End Date Taking? Authorizing Provider  aspirin 81 MG tablet Take 81 mg by mouth See admin instructions. Takes 1 tablet on mondays, wednesdays, and fridays   Yes [provider]  bisoprolol (ZEBETA) 5 MG tablet Take 1 tablet by mouth once daily for blood pressure 06/28/21  Yes Corbett, Caryl Pina, NP  BREO ELLIPTA 200-25 MCG/ACT AEPB 1 puff daily. 07/08/21   Yes [provider]  cholecalciferol (VITAMIN D) 1000 UNITS tablet Take 5,000 Units by mouth daily.    Yes [provider]  ezetimibe (ZETIA) 10 MG tablet Take  1 tablet  Daily  for Cholesterol                                         /                                      TAKE                   BY                 MOUTH 05/29/21  Yes Unk Pinto, MD  famotidine (PEPCID) 20 MG tablet Take 20 mg by mouth 2 (two) times daily.    Yes [provider]  fluticasone-salmeterol (ADVAIR) 500-50 MCG/ACT AEPB Use 1 Inhalation 2 x /day ( every 12 hours ) Asthma maintenance Patient not taking: Reported on 07/15/2021 03/21/21   Unk Pinto, MD  gemfibrozil (LOPID) 600 MG tablet Take  1 tablet  2 x /day  with Meals for Triglycerides (Blood Fats) / Patient knows to take by mouth 09/18/20  Yes Unk Pinto, MD  guaiFENesin (MUCINEX) 600 MG 12 hr tablet Take 600 mg  by mouth as needed.    Yes [provider]  Ivermectin 1 % CREA Soolantra 1 % topical cream  APPLY TO AFFECTED AREA ON THE SKIN ONCE OR TWICE DAILY   Yes [provider]  linaclotide (LINZESS) 290 MCG CAPS capsule Take  1 capsule  Daily  for IBS - C 07/15/21  Yes Liane Comber, NP  Magnesium 400 MG CAPS Take 400 mg by mouth daily.   Yes [provider]  meloxicam (MOBIC) 15 MG tablet Take 1/2 to 1 tablet Daily with Food for Pain & Inflammation & try limit to 4-5 tabs /week to avoid Kidney Damage 04/28/21  Yes Magda Bernheim, NP  pantoprazole (PROTONIX) 40 MG tablet Take 1 tablet Daily to Prevent Heartburn & Reflux 02/25/21  Yes Unk Pinto, MD  SOOLANTRA 1 % CREA  11/13/16  Yes [provider]  triamcinolone (NASACORT) 55 MCG/ACT AERO nasal inhaler Nasacort   Yes [provider]  vitamin E 400 UNIT capsule Take 400 Units by mouth daily.    Yes [provider]  albuterol (VENTOLIN HFA) 108 (90 Base) MCG/ACT inhaler INHALE 2 PUFFS 15 MINUTES APART BY MOUTH EVERY 4 HOURS AS  NEEDED FOR ASTHMA 06/13/21   Magda Bernheim, NP  dexamethasone (DECADRON) 4 MG tablet Take 1 tab 3 x day - 3 days, then 2 x day - 3 days, then 1 tab daily Patient not taking: Reported on 07/15/2021 06/23/21   Unk Pinto, MD  FLUARIX QUADRIVALENT 0.5 ML injection  12/05/16   [provider]  fluconazole (DIFLUCAN) 150 MG tablet Take  1 to 2 tablets  /week as needed for  Yeast Infections Patient not taking: Reported on 07/15/2021 07/11/21   Unk Pinto, MD  hydrocortisone (PROCTOSOL HC) 2.5 % rectal cream Place 1 application rectally as needed for hemorrhoids or anal itching. 05/11/20   Liane Comber, NP  mupirocin nasal ointment (BACTROBAN) 2 % Place 1 application. into the nose 2 (two) times daily. Patient not taking: Reported on 07/15/2021 05/17/21   Liane Comber, NP  Olopatadine HCl 0.2 % SOLN Use 1-2 drops per eye daily Patient not taking: Reported on 07/15/2021 01/04/17   Unk Pinto, MD  Omega-3 Fatty Acids (FISH OIL) 1000 MG CAPS Take 1 capsule by mouth daily.    [provider]  Sulfacetamide Sodium-Sulfur 8-4 % SUSP [facility] FACE ONCE OR TWICE DAILY Patient not taking: Reported on 07/15/2021 11/28/16   [provider]    Current Outpatient Medications  Medication Sig Dispense Refill   aspirin 81 MG tablet Take 81 mg by mouth See admin instructions. Takes 1 tablet on mondays, wednesdays, and fridays     bisoprolol (ZEBETA) 5 MG tablet Take 1 tablet by mouth once daily for blood pressure 90 tablet 1   BREO ELLIPTA 200-25 MCG/ACT AEPB 1 puff daily.     cholecalciferol (VITAMIN D) 1000 UNITS tablet Take 5,000 Units by mouth daily.      ezetimibe (ZETIA) 10 MG tablet Take  1 tablet  Daily  for Cholesterol                                         /                                      TAKE  BY                 MOUTH 90 tablet 3   famotidine (PEPCID) 20 MG tablet Take 20 mg by mouth 2 (two) times daily.      fluticasone-salmeterol (ADVAIR) 500-50 MCG/ACT  AEPB Use 1 Inhalation 2 x /day ( every 12 hours ) Asthma maintenance (Patient not taking: Reported on 07/15/2021) 180 each 3   gemfibrozil (LOPID) 600 MG tablet Take  1 tablet  2 x /day  with Meals for Triglycerides (Blood Fats) / Patient knows to take by mouth 180 tablet 3   guaiFENesin (MUCINEX) 600 MG 12 hr tablet Take 600 mg by mouth as needed.      Ivermectin 1 % CREA Soolantra 1 % topical cream  APPLY TO AFFECTED AREA ON THE SKIN ONCE OR TWICE DAILY     linaclotide (LINZESS) 290 MCG CAPS capsule Take  1 capsule  Daily  for IBS - C 90 capsule 3   Magnesium 400 MG CAPS Take 400 mg by mouth daily.     meloxicam (MOBIC) 15 MG tablet Take 1/2 to 1 tablet Daily with Food for Pain & Inflammation & try limit to 4-5 tabs /week to avoid Kidney Damage 90 tablet 0   pantoprazole (PROTONIX) 40 MG tablet Take 1 tablet Daily to Prevent Heartburn & Reflux 90 tablet 3   SOOLANTRA 1 % CREA      triamcinolone (NASACORT) 55 MCG/ACT AERO nasal inhaler Nasacort     vitamin E 400 UNIT capsule Take 400 Units by mouth daily.      albuterol (VENTOLIN HFA) 108 (90 Base) MCG/ACT inhaler INHALE 2 PUFFS 15 MINUTES APART BY MOUTH EVERY 4 HOURS AS NEEDED FOR ASTHMA 9 g 0   dexamethasone (DECADRON) 4 MG tablet Take 1 tab 3 x day - 3 days, then 2 x day - 3 days, then 1 tab daily (Patient not taking: Reported on 07/15/2021) 20 tablet 1   FLUARIX QUADRIVALENT 0.5 ML injection  (Patient not taking: Reported on 07/15/2021)     fluconazole (DIFLUCAN) 150 MG tablet Take  1 to 2 tablets  /week as needed for  Yeast Infections (Patient not taking: Reported on 07/15/2021) 8 tablet 3   hydrocortisone (PROCTOSOL HC) 2.5 % rectal cream Place 1 application rectally as needed for hemorrhoids or anal itching. 30 g 0   mupirocin nasal ointment (BACTROBAN) 2 % Place 1 application. into the nose 2 (two) times daily. (Patient not taking: Reported on 07/15/2021) 10 g 0   Olopatadine HCl 0.2 % SOLN Use 1-2 drops per eye daily (Patient not taking: Reported  on 07/15/2021) 2.5 mL 3   Omega-3 Fatty Acids (FISH OIL) 1000 MG CAPS Take 1 capsule by mouth daily.     Sulfacetamide Sodium-Sulfur 8-4 % SUSP [place] FACE ONCE OR TWICE DAILY (Patient not taking: Reported on 07/15/2021)  3   Current Facility-Administered Medications  Medication Dose Route Frequency Provider Last Rate Last Admin   0.9 %  sodium chloride infusion  500 mL Intravenous Once Lachrisha Ziebarth, Lajuan Lines, MD        Allergies as of 08/05/2021 - Review Complete 08/05/2021  Allergen Reaction Noted   Food  11/18/2012   Pravastatin  02/11/2019   Tramadol Itching 07/15/2021    Family History  Problem Relation Age of Onset   Breast cancer Mother 67   Lymphoma Mother    Migraines Mother    Hyperlipidemia Mother    Heart Problems Mother  Aortic value failure - stenosis    Valvular heart disease Mother    Diabetes type II Father    Migraines Sister    Hyperlipidemia Sister    Stroke Maternal Grandmother    Ovarian cancer Paternal Grandmother     Social History   Socioeconomic History   Marital status: Married    Spouse name: Not on file   Number of children: 2   Years of education: Not on file   Highest education level: Not on file  Occupational History   Occupation: Marine scientist: [employer]  Tobacco Use   Smoking status: Never   Smokeless tobacco: Never  Vaping Use   Vaping Use: Never used  Substance and Sexual Activity   Alcohol use: Not Currently    Comment: Rare   Drug use: No   Sexual activity: Yes    Partners: Male    Birth control/protection: Surgical  Other Topics Concern   Not on file  Social History Narrative   Not on file   Social Determinants of Health   Financial Resource Strain: Not on file  Food Insecurity: Not on file  Transportation Needs: Not on file  Physical Activity: Not on file  Stress: Not on file  Social Connections: Not on file  Intimate Partner Violence: Not on file    Physical Exam: Vital signs in last 24 hours: '@BP'$  109/69    Pulse 64   Temp 99.1 F (37.3 C) (Temporal)   Ht '5\' 3"'$  (1.6 m)   Wt 155 lb (70.3 kg)   SpO2 100%   BMI 27.46 kg/m  GEN: NAD EYE: Sclerae anicteric ENT: MMM CV: Non-tachycardic Pulm: CTA b/l GI: Soft, NT/ND NEURO:  Alert & Oriented x 3   Zenovia Jarred, MD [facility]  08/05/2021 11:02 AM

## 2021-08-05 NOTE — Progress Notes (Signed)
To pacu, VSS. Report to [email or web address] 

## 2021-08-05 NOTE — Patient Instructions (Addendum)
Impression/Recommendations:  Polyp handout given to patient.  Resume previous diet.  Initiate Trulance 3 mg. Daily.   Discontinue Linzess.  Await pathology results.  Repeat colonscopy recommended.  Date to be determined after pathology results reviewed.  YOU HAD AN ENDOSCOPIC PROCEDURE TODAY AT [facility]:   Refer to the procedure report that was given to you for any specific questions about what was found during the examination.  If the procedure report does not answer your questions, please call your gastroenterologist to clarify.  If you requested that your care partner not be given the details of your procedure findings, then the procedure report has been included in a sealed envelope for you to review at your convenience later.  YOU SHOULD EXPECT: Some feelings of bloating in the abdomen. Passage of more gas than usual.  Walking can help get rid of the air that was put into your GI tract during the procedure and reduce the bloating. If you had a lower endoscopy (such as a colonoscopy or flexible sigmoidoscopy) you may notice spotting of blood in your stool or on the toilet paper. If you underwent a bowel prep for your procedure, you may not have a normal bowel movement for a few days.  Please Note:  You might notice some irritation and congestion in your nose or some drainage.  This is from the oxygen used during your procedure.  There is no need for concern and it should clear up in a day or so.  SYMPTOMS TO REPORT IMMEDIATELY:  Following lower endoscopy (colonoscopy or flexible sigmoidoscopy):  Excessive amounts of blood in the stool  Significant tenderness or worsening of abdominal pains  Swelling of the abdomen that is new, acute  Fever of 100F or higher For urgent or emergent issues, a gastroenterologist can be reached at any hour by calling [phone].  Do not use MyChart messaging for urgent concerns.    DIET:  We do recommend a small meal at first,  but then you may proceed to your regular diet.  Drink plenty of fluids but you should avoid alcoholic beverages for 24 hours.  ACTIVITY:  You should plan to take it easy for the rest of today and you should NOT DRIVE or use heavy machinery until tomorrow (because of the sedation medicines used during the test).    FOLLOW UP: Our staff will call the number listed on your records 48-72 hours following your procedure to check on you and address any questions or concerns that you may have regarding the information given to you following your procedure. If we do not reach you, we will leave a message.  We will attempt to reach you two times.  During this call, we will ask if you have developed any symptoms of COVID 19. If you develop any symptoms (ie: fever, flu-like symptoms, shortness of breath, cough etc.) before then, please call [phone].  If you test positive for Covid 19 in the 2 weeks post procedure, please call and report this information to [place].    If any biopsies were taken you will be contacted by phone or by letter within the next 1-3 weeks.  Please call us at [phone] if you have not heard about the biopsies in 3 weeks.    SIGNATURES/CONFIDENTIALITY: You and/or your care partner have signed paperwork which will be entered into your electronic medical record.  These signatures attest to the fact that that the information above on your After Visit Summary has been reviewed  and is understood.  Full responsibility of the confidentiality of this discharge information lies with you and/or your care-partner.

## 2021-08-05 NOTE — Op Note (Signed)
[place] Patient Name: [name] Procedure Date: 08/05/2021 11:04 AM MRN: [id number] Endoscopist: Jerene Bears , MD Age: 51 Referring MD:  Date of Birth: 27-Aug-1970 Gender: Female Account #: [id number] Procedure:                Colonoscopy Indications:              Screening for colorectal malignant neoplasm, This                            is the patient's first colonoscopy Medicines:                Monitored Anesthesia Care Procedure:                Pre-Anesthesia Assessment:                           - Prior to the procedure, a History and Physical                            was performed, and patient medications and                            allergies were reviewed. The patient's tolerance of                            previous anesthesia was also reviewed. The risks                            and benefits of the procedure and the sedation                            options and risks were discussed with the patient.                            All questions were answered, and informed consent                            was obtained. Prior Anticoagulants: The patient has                            taken no previous anticoagulant or antiplatelet                            agents. ASA Grade Assessment: II - A patient with                            mild systemic disease. After reviewing the risks                            and benefits, the patient was deemed in                            satisfactory condition to undergo the procedure.  After obtaining informed consent, the colonoscope                            was passed under direct vision. Throughout the                            procedure, the patient's blood pressure, pulse, and                            oxygen saturations were monitored continuously. The                            PCF-HQ190L Colonoscope was introduced through the                            anus and advanced to the  cecum, identified by                            appendiceal orifice and ileocecal valve. The                            colonoscopy was performed without difficulty. The                            patient tolerated the procedure well. The quality                            of the bowel preparation was good (2 day prep                            used). The ileocecal valve, appendiceal orifice,                            and rectum were photographed. Scope In: 11:15:46 AM Scope Out: 11:32:03 AM Scope Withdrawal Time: 0 hours 12 minutes 55 seconds  Total Procedure Duration: 0 hours 16 minutes 17 seconds  Findings:                 The digital rectal exam was normal.                           A 7 mm polyp was found in the cecum. The polyp was                            sessile. The polyp was removed with a cold snare.                            Resection and retrieval were complete.                           A 4 mm polyp was found in the descending colon. The                            polyp was sessile.  The polyp was removed with a                            cold snare. Resection and retrieval were complete.                           The exam was otherwise without abnormality on                            direct and retroflexion views. Complications:            No immediate complications. Estimated Blood Loss:     Estimated blood loss was minimal. Impression:               - One 7 mm polyp in the cecum, removed with a cold                            snare. Resected and retrieved.                           - One 4 mm polyp in the descending colon, removed                            with a cold snare. Resected and retrieved.                           - The examination was otherwise normal on direct                            and retroflexion views. Recommendation:           - Patient has a contact number available for                            emergencies. The signs and symptoms of potential                             delayed complications were discussed with the                            patient. Return to normal activities tomorrow.                            Written discharge instructions were provided to the                            patient.                           - Resume previous diet.                           - Continue present medications.                           - Await pathology results.                           -  Repeat colonoscopy is recommended. The                            colonoscopy date will be determined after pathology                            results from today's exam become available for                            review. Jerene Bears, MD 08/05/2021 11:35:35 AM This report has been signed electronically.

## 2021-08-05 NOTE — Progress Notes (Signed)
Called to room to assist during endoscopic procedure.  Patient ID and intended procedure confirmed with present staff. Received instructions for my participation in the procedure from the performing physician.  

## 2021-08-09 ENCOUNTER — Telehealth: Payer: Self-pay | Admitting: Internal Medicine

## 2021-08-09 ENCOUNTER — Telehealth: Payer: Self-pay | Admitting: *Deleted

## 2021-08-09 NOTE — Telephone Encounter (Signed)
  Follow up Call-     08/05/2021   10:16 AM  Call back number  Post procedure Call Back phone  # [phone]  Permission to leave phone message Yes     Patient questions:  Do you have a fever, pain , or abdominal swelling? No. Pain Score  0 *  Have you tolerated food without any problems? Yes.    Have you been able to return to your normal activities? Yes.    Do you have any questions about your discharge instructions: Diet   No. Medications  No. Follow up visit  No.  Do you have questions or concerns about your Care? Yes.    Patient has new script that was put in that needs prior authorization - wanted to know if that's something that the doctor can help fix or not.   Actions: * If pain score is 4 or above: No action needed, pain <4.

## 2021-08-09 NOTE — Telephone Encounter (Signed)
Inbound call from patient stating she needs a prior authorization for  Trulance. Please advise.

## 2021-08-10 NOTE — Telephone Encounter (Signed)
Received notification from [facility] regarding a prior authorization for TRULANCE '3MG'$ . Authorization has been APPROVED from 5.26.23 to 5.26.24.   Per test claim, copay for 30 days supply is $25   Authorization # [id number]

## 2021-08-12 ENCOUNTER — Other Ambulatory Visit: Payer: Self-pay

## 2021-08-12 ENCOUNTER — Encounter: Payer: Self-pay | Admitting: Internal Medicine

## 2021-08-12 MED ORDER — MELOXICAM 15 MG PO TABS: ORAL_TABLET | ORAL | 0 refills | Status: DC

## 2021-08-17 DIAGNOSIS — L710 Perioral dermatitis: Secondary | ICD-10-CM | POA: Diagnosis not present

## 2021-08-17 DIAGNOSIS — L718 Other rosacea: Secondary | ICD-10-CM | POA: Diagnosis not present

## 2021-08-17 DIAGNOSIS — D2339 Other benign neoplasm of skin of other parts of face: Secondary | ICD-10-CM | POA: Diagnosis not present

## 2021-11-02 ENCOUNTER — Other Ambulatory Visit: Payer: Self-pay | Admitting: Internal Medicine

## 2021-11-02 MED ORDER — HYDROCORTISONE (PERIANAL) 2.5 % EX CREA: 1.0000 | TOPICAL_CREAM | CUTANEOUS | 3 refills | Status: AC | PRN

## 2021-11-07 ENCOUNTER — Other Ambulatory Visit: Payer: Self-pay | Admitting: Nurse Practitioner

## 2021-11-07 DIAGNOSIS — G8929 Other chronic pain: Secondary | ICD-10-CM

## 2021-11-07 MED ORDER — MELOXICAM 15 MG PO TABS: ORAL_TABLET | ORAL | 0 refills | Status: DC

## 2021-11-08 DIAGNOSIS — Z79899 Other long term (current) drug therapy: Secondary | ICD-10-CM | POA: Diagnosis not present

## 2021-11-08 DIAGNOSIS — Z1152 Encounter for screening for COVID-19: Secondary | ICD-10-CM | POA: Diagnosis not present

## 2021-11-08 DIAGNOSIS — E782 Mixed hyperlipidemia: Secondary | ICD-10-CM | POA: Diagnosis not present

## 2021-11-08 DIAGNOSIS — E559 Vitamin D deficiency, unspecified: Secondary | ICD-10-CM | POA: Diagnosis not present

## 2021-11-08 DIAGNOSIS — N946 Dysmenorrhea, unspecified: Secondary | ICD-10-CM | POA: Diagnosis not present

## 2021-11-08 DIAGNOSIS — J4530 Mild persistent asthma, uncomplicated: Secondary | ICD-10-CM | POA: Diagnosis not present

## 2021-11-15 ENCOUNTER — Other Ambulatory Visit: Payer: Self-pay | Admitting: Nurse Practitioner

## 2021-11-15 DIAGNOSIS — K581 Irritable bowel syndrome with constipation: Secondary | ICD-10-CM

## 2021-11-28 ENCOUNTER — Other Ambulatory Visit: Payer: Self-pay | Admitting: Internal Medicine

## 2021-11-28 DIAGNOSIS — E782 Mixed hyperlipidemia: Secondary | ICD-10-CM

## 2022-01-19 DIAGNOSIS — G43009 Migraine without aura, not intractable, without status migrainosus: Secondary | ICD-10-CM | POA: Diagnosis not present

## 2022-01-19 DIAGNOSIS — F902 Attention-deficit hyperactivity disorder, combined type: Secondary | ICD-10-CM | POA: Diagnosis not present

## 2022-01-23 DIAGNOSIS — H40003 Preglaucoma, unspecified, bilateral: Secondary | ICD-10-CM | POA: Diagnosis not present

## 2022-02-05 ENCOUNTER — Other Ambulatory Visit: Payer: Self-pay | Admitting: Nurse Practitioner

## 2022-02-13 NOTE — Progress Notes (Unsigned)
Complete Physical  Assessment and Plan:  [name] was seen today for annual exam.  Diagnoses and all orders for this visit:  Encounter for Annual Physical Exam with abnormal findings Due annually  Health Maintenance reviewed Healthy lifestyle reviewed and goals set Get shingrix at pharmacy  GI referral for colonoscopy  Requested GYN reports once complete Discussed pneumonia vaccine but declines this year  Uncomplicated asthma, unspecified asthma severity, unspecified whether persistent Controlled without recent flares Avoid triggers, continue medications  Irritable bowel syndrome, constipation Well controlled with current regimen, discussed increasing soluble fiber intake  continue linzess  Gall bladder disease No symptoms, monitor   Renal stone None recent, monitor, push water intake Has seen urology [name] in 2016  Vitamin D deficiency -     VITAMIN D 25 Hydroxy (Vit-D Deficiency, Fractures)  BMI 27 Continue to recommend diet heavy in fruits and veggies and low in animal meats, cheeses, and dairy products, appropriate calorie intake Discuss exercise recommendations routinely Continue to monitor weight at each visit  Hx of prediabetes Discussed disease and risks Discussed diet/exercise, weight management  -     COMPLETE METABOLIC PANEL WITH GFR -     Hemoglobin A1c -     Urinalysis, Routine w reflex microscopic  Medication management -     CBC with Differential/Platelet -     COMPLETE METABOLIC PANEL WITH GFR -     Magnesium -     Urinalysis, Routine w reflex microscopic  Hyperlipidemia, unspecified hyperlipidemia type/ statin myopathy Continue medications Continue low cholesterol diet and exercise.  Check lipid panel.  -     Lipid panel -     TSH  Gastroesophageal reflux disease, esophagitis presence not specified Try OTC nexium at night x 2 weeks, monitor throat sx Contact office if this improves Continue famotidine in AM Discussed diet, avoiding  triggers and other lifestyle changes -     Magnesium  Screening for cardiovascular condition -     EKG 12-Lead  Bil knee pain NSAID PRN, established with ortho  No orders of the defined types were placed in this encounter.    Discussed med's effects and SE's. Screening labs and tests as requested with regular follow-up as recommended. Over 40 minutes of exam, counseling, chart review, and complex, high level critical decision making was performed this visit.   Future Appointments  Date Time Provider [place]  02/14/2022  9:00 AM [name], NP [facility] None  02/15/2023  9:00 AM [name], NP [facility] None     HPI  51 y.o. female  presents for a complete physical and follow up for has Asthma; IBS (irritable bowel syndrome); Hyperlipidemia; Renal stone; Gall bladder disease; History of prediabetes; Medication management; Vitamin D deficiency; Overweight (BMI 25.0-29.9); GERD (gastroesophageal reflux disease); History of right lateral epicondylitis; Statin myopathy; Chronic pain of both knees; and B12 deficiency on their problem list.   She is a Software engineer at [employer], married, 2 kids, eldest daughter is in college. Younger is son 20. No new concerns.   Has bil knee pain, now seeing [name] and had steroid injections which have helped but don't last. Also takes meloxicam.    She reports that she does occasionally get cystic acne lesions. She is also prescribed sulfa/sulfer face wash and spironolactone 50 mg and reports significant improvement. Followed by derm and gets annual total body skin check.    She has IBS-C with symptoms stabilized on linzess 72 mcg. Improved with recent diet. Takes pepcid BID for GERD,  however does report intermittent AM and post meal voice changes.   She has asthma well controlled without recent flares on breo.   She does have a first degree relative with breast cancer.  Follows with [facility] (GYN) [name]  for PAPs and mammograms.   BMI is There is no height or weight on file to calculate BMI., she has been working on diet and exercise though limited due to knee pain (minimial running).  She is on feet all day and active at work.  She is doing a high protein, 5-6 small meals daily. Trying to include more fruits and veggies.  She drinks minimum 3-4 bottles of water daily  Wt Readings from Last 3 Encounters:  08/05/21 155 lb (70.3 kg)  07/15/21 155 lb (70.3 kg)  07/05/21 155 lb (70.3 kg)   Her blood pressure has been controlled at home, today their BP is   She does not workout. She denies chest pain, shortness of breath, dizziness.   She is on cholesterol medication (gemfibrozil, zetia, omega 3) and denies myalgias. Her cholesterol is not at goal. The cholesterol last visit was:   Lab Results  Component Value Date   CHOL 200 (H) 02/11/2021   HDL 53 02/11/2021   LDLCALC 129 (H) 02/11/2021   TRIG 82 02/11/2021   CHOLHDL 3.8 02/11/2021   She has been working on diet and exercise for glucose management, and denies increased appetite, nausea, paresthesia of the feet, polydipsia, polyuria, visual disturbances and vomiting. Last A1C in the office was:  Lab Results  Component Value Date   HGBA1C 5.5 02/11/2021   Last GFR: Lab Results  Component Value Date   [facility] 104 02/11/2020   Patient is on Vitamin D supplement, taking ? 5000 IU daily, takes 2000 IU during the summer Lab Results  Component Value Date   VD25OH 49 02/11/2021   She takes B12 cap supplement 3 days a week;  Lab Results  Component Value Date   [id number] 616 02/11/2021     Current Medications:  Current Outpatient Medications on File Prior to Visit  Medication Sig Dispense Refill   albuterol (VENTOLIN HFA) 108 (90 Base) MCG/ACT inhaler INHALE 2 PUFFS 15 MINUTES APART BY MOUTH EVERY 4 HOURS AS NEEDED FOR ASTHMA 9 g 0   aspirin 81 MG tablet Take 81 mg by mouth See admin instructions. Takes 1 tablet on mondays,  wednesdays, and fridays     bisoprolol (ZEBETA) 5 MG tablet Take 1 tablet by mouth once daily for blood pressure 90 tablet 1   BREO ELLIPTA 200-25 MCG/ACT AEPB Inhale 1 puff by mouth once daily 180 each 0   cholecalciferol (VITAMIN D) 1000 UNITS tablet Take 5,000 Units by mouth daily.      ezetimibe (ZETIA) 10 MG tablet Take  1 tablet  Daily  for Cholesterol                                         /                                      TAKE                   BY  MOUTH 90 tablet 3   famotidine (PEPCID) 20 MG tablet Take 20 mg by mouth 2 (two) times daily.      gemfibrozil (LOPID) 600 MG tablet TAKE 1 TABLET BY MOUTH TWICE DAILY WITH  MEALS  FOR  TRIGLYCERIDES  (BLOOD  FATS) 180 tablet 0   guaiFENesin (MUCINEX) 600 MG 12 hr tablet Take 600 mg by mouth as needed.      hydrocortisone (PROCTOSOL HC) 2.5 % rectal cream Place 1 Application rectally as needed for hemorrhoids or anal itching. Apply 2 to 4 x /day as needed for Rectal Pruritis 30 g 3   Ivermectin 1 % CREA Soolantra 1 % topical cream  APPLY TO AFFECTED AREA ON THE SKIN ONCE OR TWICE DAILY     linaclotide (LINZESS) 290 MCG CAPS capsule Take  1 capsule  Daily  for IBS - C 90 capsule 3   Magnesium 400 MG CAPS Take 400 mg by mouth daily.     meloxicam (MOBIC) 15 MG tablet Take 1/2 to 1 tablet Daily with Food for Pain & Inflammation & try limit to 4-5 tabs /week to avoid Kidney Damage 90 tablet 0   mupirocin nasal ointment (BACTROBAN) 2 % Place 1 application. into the nose 2 (two) times daily. (Patient not taking: Reported on 07/15/2021) 10 g 0   Olopatadine HCl 0.2 % SOLN Use 1-2 drops per eye daily (Patient not taking: Reported on 07/15/2021) 2.5 mL 3   Omega-3 Fatty Acids (FISH OIL) 1000 MG CAPS Take 1 capsule by mouth daily.     pantoprazole (PROTONIX) 40 MG tablet Take 1 tablet Daily to Prevent Heartburn & Reflux 90 tablet 3   Plecanatide (TRULANCE) 3 MG TABS Take 3 mg by mouth daily. 30 tablet 1   SOOLANTRA 1 % CREA       Sulfacetamide Sodium-Sulfur 8-4 % SUSP WASH FACE ONCE OR TWICE DAILY (Patient not taking: Reported on 07/15/2021)  3   triamcinolone (NASACORT) 55 MCG/ACT AERO nasal inhaler Nasacort     vitamin E 400 UNIT capsule Take 400 Units by mouth daily.      Current Facility-Administered Medications on File Prior to Visit  Medication Dose Route Frequency Provider Last Rate Last Admin   0.9 %  sodium chloride infusion  500 mL Intravenous Once Pyrtle, Lajuan Lines, MD       Allergies:  Allergies  Allergen Reactions   Food     strawberries   Pravastatin     Severe myalgias    Tramadol Itching   Medical History:  She has Asthma; IBS (irritable bowel syndrome); Hyperlipidemia; Renal stone; Gall bladder disease; History of prediabetes; Medication management; Vitamin D deficiency; Overweight (BMI 25.0-29.9); GERD (gastroesophageal reflux disease); History of right lateral epicondylitis; Statin myopathy; Chronic pain of both knees; and B12 deficiency on their problem list. Health Maintenance:   Immunization History  Administered Date(s) Administered   Hepatitis B 09/08/2015, 10/08/2015, 03/08/2016   Influenza Inj Mdck Quad Pf 12/16/2020   Influenza Split 12/05/2016, 11/29/2017, 12/07/2018   Influenza, Quadrivalent, Recombinant, Inj, Pf 12/13/2021   Influenza,inj,quad, With Preservative 01/03/2020   Influenza-Unspecified 12/26/2013, 11/26/2014, 12/21/2015   PFIZER(Purple Top)SARS-COV-2 Vaccination 11/08/2019, 11/29/2019   PPD Test 11/25/2013, 12/28/2016   Tdap 09/17/2012    Tetanus: 2014 Flu vaccine: 01/2021 Shingrix: will get next year at work  Covid 19: [place], 2/2, 2021   Pap: 02/13/2019, [facility]y [facility], goes annually for pelvic  Has upcoming scheduled 04/2021 - req report MGM: 03/2020 at GYN, has  scheduled 04/2021 -- req report  Colonoscopy: new guidelines age 75, wants to wait until age 32, no family history EGD:-   Last Dental Exam: [facility], last  2022, goes q64mLast Eye Exam: Dr. WAgapito Games 2022, goes annually, needs to reschedule for this year  Last Derm: Dr. EMagnus Sinning 2021, does annual total body, has upcoming 04/2021  Patient Care Team: MUnk Pinto MD as PCP - General (Internal Medicine) Pyrtle, JLajuan Lines MD as Referring Physician (Gastroenterology) WJimmey Ralph NP (Inactive) as Nurse Practitioner (Urology) TEverlene Farrier MD as Consulting Physician (Obstetrics and Gynecology) LDruscilla Brownie MD as Consulting Physician (Dermatology) PInocencio Homes DPM as Consulting Physician (Podiatry)  Surgical History:  She has a past surgical history that includes Ectopic pregnancy surgery (2005); Partial hysterectomy (2009); Esophagogastroduodenoscopy (06/13/2011); and Cholecystectomy (07/27/2011). Family History:  Herfamily history includes Breast cancer (age of onset: 624 in her mother; Diabetes type II in her father; Heart Problems in her mother; Hyperlipidemia in her mother and sister; Lymphoma in her mother; Migraines in her mother and sister; Ovarian cancer in her paternal grandmother; Stroke in her maternal grandmother; Valvular heart disease in her mother. Social History:  She reports that she has never smoked. She has never used smokeless tobacco. She reports that she does not currently use alcohol. She reports that she does not use drugs.  Review of Systems: Review of Systems  Constitutional:  Negative for malaise/fatigue and weight loss.  HENT:  Negative for hearing loss and tinnitus.        AM throat clearing and frogginess  Eyes:  Negative for blurred vision and double vision.  Respiratory:  Negative for cough, shortness of breath and wheezing.   Cardiovascular:  Negative for chest pain, palpitations, orthopnea, claudication and leg swelling.  Gastrointestinal:  Positive for constipation (IBS D, improved with meds, intermittent). Negative for abdominal pain, blood in stool, diarrhea, heartburn, melena, nausea  and vomiting.  Genitourinary: Negative.   Musculoskeletal:  Positive for joint pain (bil knees - seeing ortho). Negative for myalgias.  Skin:  Negative for rash.  Neurological:  Negative for dizziness, tingling, sensory change, weakness and headaches.  Endo/Heme/Allergies:  Negative for polydipsia.  Psychiatric/Behavioral: Negative.    All other systems reviewed and are negative.   Physical Exam: Estimated body mass index is 27.46 kg/m as calculated from the following:   Height as of 08/05/21: '5\' 3"'$  (1.6 m).   Weight as of 08/05/21: 155 lb (70.3 kg). There were no vitals taken for this visit. General Appearance: Well nourished, in no apparent distress.  Eyes: PERRLA, EOMs, conjunctiva no swelling or erythema Sinuses: No Frontal/maxillary tenderness  ENT/Mouth: Ext aud canals clear, normal light reflex with TMs without erythema, bulging. Good dentition. No erythema, swelling, or exudate on post pharynx. Tonsils not swollen or erythematous. Hearing normal.  Neck: Supple, thyroid normal. No bruits  Respiratory: Respiratory effort normal, BS equal bilaterally without rales, rhonchi, wheezing or stridor.  Cardio: RRR without murmurs, rubs or gallops. Brisk peripheral pulses without edema.  Chest: symmetric, with normal excursions and percussion.  Breasts: Defer to GYN Abdomen: Soft, nontender, no guarding, rebound, hernias, masses, or organomegaly.  Lymphatics: Non tender without lymphadenopathy.  Genitourinary: Defer to GYN Musculoskeletal: Full ROM all peripheral extremities,5/5 strength, and normal gait.  Skin: Warm, dry without rashes, lesions, ecchymosis. Neuro: Cranial nerves intact, reflexes equal bilaterally. Normal muscle tone, no cerebellar symptoms. Sensation intact.  Psych: Awake and oriented X 3, normal affect, Insight and Judgment appropriate.   EKG: Sinus brady  49 BPM, RSR' V1, V2, IRBBB  Danell Verno 1:34 PM [facility]

## 2022-02-14 ENCOUNTER — Ambulatory Visit (INDEPENDENT_AMBULATORY_CARE_PROVIDER_SITE_OTHER): Payer: BC Managed Care – PPO | Admitting: Nurse Practitioner

## 2022-02-14 VITALS — BP 112/80 | HR 68 | Temp 97.2°F | Ht 63.0 in | Wt 157.8 lb

## 2022-02-14 DIAGNOSIS — Z87898 Personal history of other specified conditions: Secondary | ICD-10-CM

## 2022-02-14 DIAGNOSIS — Z136 Encounter for screening for cardiovascular disorders: Secondary | ICD-10-CM

## 2022-02-14 DIAGNOSIS — Z13220 Encounter for screening for lipoid disorders: Secondary | ICD-10-CM

## 2022-02-14 DIAGNOSIS — I10 Essential (primary) hypertension: Secondary | ICD-10-CM | POA: Diagnosis not present

## 2022-02-14 DIAGNOSIS — K219 Gastro-esophageal reflux disease without esophagitis: Secondary | ICD-10-CM

## 2022-02-14 DIAGNOSIS — E559 Vitamin D deficiency, unspecified: Secondary | ICD-10-CM

## 2022-02-14 DIAGNOSIS — Z0000 Encounter for general adult medical examination without abnormal findings: Secondary | ICD-10-CM

## 2022-02-14 DIAGNOSIS — Z0001 Encounter for general adult medical examination with abnormal findings: Secondary | ICD-10-CM

## 2022-02-14 DIAGNOSIS — N200 Calculus of kidney: Secondary | ICD-10-CM

## 2022-02-14 DIAGNOSIS — Z1389 Encounter for screening for other disorder: Secondary | ICD-10-CM | POA: Diagnosis not present

## 2022-02-14 DIAGNOSIS — N951 Menopausal and female climacteric states: Secondary | ICD-10-CM

## 2022-02-14 DIAGNOSIS — M25542 Pain in joints of left hand: Secondary | ICD-10-CM

## 2022-02-14 DIAGNOSIS — R232 Flushing: Secondary | ICD-10-CM

## 2022-02-14 DIAGNOSIS — E785 Hyperlipidemia, unspecified: Secondary | ICD-10-CM

## 2022-02-14 DIAGNOSIS — Z1329 Encounter for screening for other suspected endocrine disorder: Secondary | ICD-10-CM

## 2022-02-14 DIAGNOSIS — E663 Overweight: Secondary | ICD-10-CM

## 2022-02-14 DIAGNOSIS — Z79899 Other long term (current) drug therapy: Secondary | ICD-10-CM

## 2022-02-14 DIAGNOSIS — Z131 Encounter for screening for diabetes mellitus: Secondary | ICD-10-CM

## 2022-02-14 DIAGNOSIS — K581 Irritable bowel syndrome with constipation: Secondary | ICD-10-CM

## 2022-02-14 DIAGNOSIS — K829 Disease of gallbladder, unspecified: Secondary | ICD-10-CM

## 2022-02-14 NOTE — Patient Instructions (Signed)
Healthy Eating Following a healthy eating pattern may help you to achieve and maintain a healthy body weight, reduce the risk of chronic disease, and live a long and productive life. It is important to follow a healthy eating pattern at an appropriate calorie level for your body. Your nutritional needs should be met primarily through food by choosing a variety of nutrient-rich foods. What are tips for following this plan? Reading food labels Read labels and choose the following: Reduced or low sodium. Juices with 100% fruit juice. Foods with low saturated fats and high polyunsaturated and monounsaturated fats. Foods with whole grains, such as whole wheat, cracked wheat, brown rice, and wild rice. Whole grains that are fortified with folic acid. This is recommended for women who are pregnant or who want to become pregnant. Read labels and avoid the following: Foods with a lot of added sugars. These include foods that contain brown sugar, corn sweetener, corn syrup, dextrose, fructose, glucose, high-fructose corn syrup, honey, invert sugar, lactose, malt syrup, maltose, molasses, raw sugar, sucrose, trehalose, or turbinado sugar. Do not eat more than the following amounts of added sugar per day: 6 teaspoons (25 g) for women. 9 teaspoons (38 g) for men. Foods that contain processed or refined starches and grains. Refined grain products, such as white flour, degermed cornmeal, white bread, and white rice. Shopping Choose nutrient-rich snacks, such as vegetables, whole fruits, and nuts. Avoid high-calorie and high-sugar snacks, such as potato chips, fruit snacks, and candy. Use oil-based dressings and spreads on foods instead of solid fats such as butter, stick margarine, or cream cheese. Limit pre-made sauces, mixes, and "instant" products such as flavored rice, instant noodles, and ready-made pasta. Try more plant-protein sources, such as tofu, tempeh, black beans, edamame, lentils, nuts, and  seeds. Explore eating plans such as the Mediterranean diet or vegetarian diet. Cooking Use oil to saut or stir-fry foods instead of solid fats such as butter, stick margarine, or lard. Try baking, boiling, grilling, or broiling instead of frying. Remove the fatty part of meats before cooking. Steam vegetables in water or broth. Meal planning  At meals, imagine dividing your plate into fourths: One-half of your plate is fruits and vegetables. One-fourth of your plate is whole grains. One-fourth of your plate is protein, especially lean meats, poultry, eggs, tofu, beans, or nuts. Include low-fat dairy as part of your daily diet. Lifestyle Choose healthy options in all settings, including home, work, school, restaurants, or stores. Prepare your food safely: Wash your hands after handling raw meats. Keep food preparation surfaces clean by regularly washing with hot, soapy water. Keep raw meats separate from ready-to-eat foods, such as fruits and vegetables. Cook seafood, meat, poultry, and eggs to the recommended internal temperature. Store foods at safe temperatures. In general: Keep cold foods at 40F (4.4C) or below. Keep hot foods at 140F (60C) or above. Keep your freezer at 0F (-17.8C) or below. Foods are no longer safe to eat when they have been between the temperatures of 40-140F (4.4-60C) for more than 2 hours. What foods should I eat? Fruits Aim to eat 2 cup-equivalents of fresh, canned (in natural juice), or frozen fruits each day. Examples of 1 cup-equivalent of fruit include 1 small apple, 8 large strawberries, 1 cup canned fruit,  cup dried fruit, or 1 cup 100% juice. Vegetables Aim to eat 2-3 cup-equivalents of fresh and frozen vegetables each day, including different varieties and colors. Examples of 1 cup-equivalent of vegetables include 2 medium carrots, 2 cups raw,   leafy greens, 1 cup chopped vegetable (raw or cooked), or 1 medium baked potato. Grains Aim to  eat 6 ounce-equivalents of whole grains each day. Examples of 1 ounce-equivalent of grains include 1 slice of bread, 1 cup ready-to-eat cereal, 3 cups popcorn, or  cup cooked rice, pasta, or cereal. Meats and other proteins Aim to eat 5-6 ounce-equivalents of protein each day. Examples of 1 ounce-equivalent of protein include 1 egg, 1/2 cup nuts or seeds, or 1 tablespoon (16 g) peanut butter. A cut of meat or fish that is the size of a deck of cards is about 3-4 ounce-equivalents. Of the protein you eat each week, try to have at least 8 ounces come from seafood. This includes salmon, trout, herring, and anchovies. Dairy Aim to eat 3 cup-equivalents of fat-free or low-fat dairy each day. Examples of 1 cup-equivalent of dairy include 1 cup (240 mL) milk, 8 ounces (250 g) yogurt, 1 ounces (44 g) natural cheese, or 1 cup (240 mL) fortified soy milk. Fats and oils Aim for about 5 teaspoons (21 g) per day. Choose monounsaturated fats, such as canola and olive oils, avocados, peanut butter, and most nuts, or polyunsaturated fats, such as sunflower, corn, and soybean oils, walnuts, pine nuts, sesame seeds, sunflower seeds, and flaxseed. Beverages Aim for six 8-oz glasses of water per day. Limit coffee to three to five 8-oz cups per day. Limit caffeinated beverages that have added calories, such as soda and energy drinks. Limit alcohol intake to no more than 1 drink a day for nonpregnant women and 2 drinks a day for men. One drink equals 12 oz of beer (355 mL), 5 oz of wine (148 mL), or 1 oz of hard liquor (44 mL). Seasoning and other foods Avoid adding excess amounts of salt to your foods. Try flavoring foods with herbs and spices instead of salt. Avoid adding sugar to foods. Try using oil-based dressings, sauces, and spreads instead of solid fats. This information is based on general [place] nutrition guidelines. For more information, visit [employer]. Exact amounts may vary based on your nutrition  needs. Summary A healthy eating plan may help you to maintain a healthy weight, reduce the risk of chronic diseases, and stay active throughout your life. Plan your meals. Make sure you eat the right portions of a variety of nutrient-rich foods. Try baking, boiling, grilling, or broiling instead of frying. Choose healthy options in all settings, including home, work, school, restaurants, or stores. This information is not intended to replace advice given to you by your health care provider. Make sure you discuss any questions you have with your health care provider. Document Revised: 08/10/2021 Document Reviewed: 10/26/2020 Elsevier Patient Education  [place].   Menopause Menopause is the normal time of a woman's life when menstrual periods stop completely. It marks the natural end to a woman's ability to become pregnant. It can be defined as the absence of a menstrual period for 12 months without another medical cause. The transition to menopause (perimenopause) most often happens between the ages of 58 and 28, and can last for many years. During perimenopause, hormone levels change in your body, which can cause symptoms and affect your health. Menopause may increase your risk for: Weakened bones (osteoporosis), which causes fractures. Depression. Hardening and narrowing of the arteries (atherosclerosis), which can cause heart attacks and strokes. What are the causes? This condition is usually caused by a natural change in hormone levels that happens as you get older. The condition may  also be caused by changes that are not natural, including: Surgery to remove both ovaries (surgical menopause). Side effects from some medicines, such as chemotherapy used to treat cancer (chemical menopause). What increases the risk? This condition is more likely to start at an earlier age if you have certain medical conditions or have undergone treatments, including: A tumor of the pituitary gland  in the brain. A disease that affects the ovaries and hormones. Certain cancer treatments, such as chemotherapy or hormone therapy, or radiation therapy on the pelvis. Heavy smoking and excessive alcohol use. Family history of early menopause. This condition is also more likely to develop earlier in women who are very thin. What are the signs or symptoms? Symptoms of this condition include: Hot flashes. Irregular menstrual periods. Night sweats. Changes in feelings about sex. This could be a decrease in sex drive or an increased discomfort around your sexuality. Vaginal dryness and thinning of the vaginal walls. This may cause painful sex. Dryness of the skin and development of wrinkles. Headaches. Problems sleeping (insomnia). Mood swings or irritability. Memory problems. Weight gain. Hair growth on the face and chest. Bladder infections or problems with urinating. How is this diagnosed? This condition is diagnosed based on your medical history, a physical exam, your age, your menstrual history, and your symptoms. Hormone tests may also be done. How is this treated? In some cases, no treatment is needed. You and your health care provider should make a decision together about whether treatment is necessary. Treatment will be based on your individual condition and preferences. Treatment for this condition focuses on managing symptoms. Treatment may include: Menopausal hormone therapy (MHT). Medicines to treat specific symptoms or complications. Acupuncture. Vitamin or herbal supplements. Before starting treatment, make sure to let your health care provider know if you have a personal or family history of these conditions: Heart disease. Breast cancer. Blood clots. Diabetes. Osteoporosis. Follow these instructions at home: Lifestyle Do not use any products that contain nicotine or tobacco, such as cigarettes, e-cigarettes, and chewing tobacco. If you need help quitting, ask your  health care provider. Get at least 30 minutes of physical activity on 5 or more days each week. Avoid alcoholic and caffeinated beverages, as well as spicy foods. This may help prevent hot flashes. Get 7-8 hours of sleep each night. If you have hot flashes, try: Dressing in layers. Avoiding things that may trigger hot flashes, such as spicy food, warm places, or stress. Taking slow, deep breaths when a hot flash starts. Keeping a fan in your home and office. Find ways to manage stress, such as deep breathing, meditation, or journaling. Consider going to group therapy with other women who are having menopause symptoms. Ask your health care provider about recommended group therapy meetings. Eating and drinking  Eat a healthy, balanced diet that contains whole grains, lean protein, low-fat dairy, and plenty of fruits and vegetables. Your health care provider may recommend adding more soy to your diet. Foods that contain soy include tofu, tempeh, and soy milk. Eat plenty of foods that contain calcium and vitamin D for bone health. Items that are rich in calcium include low-fat milk, yogurt, beans, almonds, sardines, broccoli, and kale. Medicines Take over-the-counter and prescription medicines only as told by your health care provider. Talk with your health care provider before starting any herbal supplements. If prescribed, take vitamins and supplements as told by your health care provider. General instructions  Keep track of your menstrual periods, including: When they occur. How heavy   they are and how long they last. How much time passes between periods. Keep track of your symptoms, noting when they start, how often you have them, and how long they last. Use vaginal lubricants or moisturizers to help with vaginal dryness and improve comfort during sex. Keep all follow-up visits. This is important. This includes any group therapy or counseling. Contact a health care provider if: You are  still having menstrual periods after age 33. You have pain during sex. You have not had a period for 12 months and you develop vaginal bleeding. Get help right away if you have: Severe depression. Excessive vaginal bleeding. Pain when you urinate. A fast or irregular heartbeat (palpitations). Severe headaches. Abdominal pain or severe indigestion. Summary Menopause is a normal time of life when menstrual periods stop completely. It is usually defined as the absence of a menstrual period for 12 months without another medical cause. The transition to menopause (perimenopause) most often happens between the ages of 44 and 52 and can last for several years. Symptoms can be managed through medicines, lifestyle changes, and complementary therapies such as acupuncture. Eat a balanced diet that is rich in nutrients to promote bone health and heart health and to manage symptoms during menopause. This information is not intended to replace advice given to you by your health care provider. Make sure you discuss any questions you have with your health care provider. Document Revised: 11/28/2019 Document Reviewed: 08/14/2019 Elsevier Patient Education  [place].

## 2022-02-15 LAB — MICROALBUMIN / CREATININE URINE RATIO
Creatinine, Urine: 32 mg/dL (ref 20–275)
Microalb, Ur: <0.2 mg/dL

## 2022-02-15 LAB — URINALYSIS, ROUTINE W REFLEX MICROSCOPIC
Bilirubin Urine: NEGATIVE
Glucose, UA: NEGATIVE
Hgb urine dipstick: NEGATIVE
Ketones, ur: NEGATIVE
Leukocytes,Ua: NEGATIVE
Nitrite: NEGATIVE
Protein, ur: NEGATIVE
Specific Gravity, Urine: 1.008 (ref 1.001–1.035)
pH: 6 (ref 5.0–8.0)

## 2022-02-15 LAB — CBC WITH DIFFERENTIAL/PLATELET
Absolute Monocytes: 495 cells/uL (ref 200–950)
Basophils Absolute: 40 cells/uL (ref 0–200)
Basophils Relative: 0.6 %
Eosinophils Absolute: 152 cells/uL (ref 15–500)
Eosinophils Relative: 2.3 %
HCT: 42.2 % (ref 35.0–45.0)
Hemoglobin: 14.2 g/dL (ref 11.7–15.5)
Lymphs Abs: 1327 cells/uL (ref 850–3900)
MCH: 28.8 pg (ref 27.0–33.0)
MCHC: 33.6 g/dL (ref 32.0–36.0)
MCV: 85.6 fL (ref 80.0–100.0)
MPV: 10.2 fL (ref 7.5–12.5)
Monocytes Relative: 7.5 %
Neutro Abs: 4587 cells/uL (ref 1500–7800)
Neutrophils Relative %: 69.5 %
Platelets: 273 10*3/uL (ref 140–400)
RBC: 4.93 10*6/uL (ref 3.80–5.10)
RDW: 12.5 % (ref 11.0–15.0)
Total Lymphocyte: 20.1 %
WBC: 6.6 10*3/uL (ref 3.8–10.8)

## 2022-02-15 LAB — COMPLETE METABOLIC PANEL WITH GFR
AG Ratio: 2.3 (calc) (ref 1.0–2.5)
ALT: 24 U/L (ref 6–29)
AST: 18 U/L (ref 10–35)
Albumin: 4.5 g/dL (ref 3.6–5.1)
Alkaline phosphatase (APISO): 81 U/L (ref 37–153)
BUN: 15 mg/dL (ref 7–25)
CO2: 24 mmol/L (ref 20–32)
Calcium: 9.2 mg/dL (ref 8.6–10.4)
Chloride: 105 mmol/L (ref 98–110)
Creat: 0.64 mg/dL (ref 0.50–1.03)
Globulin: 2 g/dL (calc) (ref 1.9–3.7)
Glucose, Bld: 84 mg/dL (ref 65–99)
Potassium: 4.6 mmol/L (ref 3.5–5.3)
Sodium: 138 mmol/L (ref 135–146)
Total Bilirubin: 0.6 mg/dL (ref 0.2–1.2)
Total Protein: 6.5 g/dL (ref 6.1–8.1)
eGFR: 107 mL/min/{1.73_m2} (ref >=60)

## 2022-02-15 LAB — FOLLICLE STIMULATING HORMONE: FSH: 5.7 m[IU]/mL

## 2022-02-15 LAB — INSULIN, RANDOM: Insulin: 10.7 u[IU]/mL

## 2022-02-15 LAB — URIC ACID: Uric Acid, Serum: 3.3 mg/dL (ref 2.5–7.0)

## 2022-02-15 LAB — LUTEINIZING HORMONE: LH: 13.1 m[IU]/mL

## 2022-02-15 LAB — MAGNESIUM: Magnesium: 2.2 mg/dL (ref 1.5–2.5)

## 2022-02-15 LAB — LIPID PANEL
Cholesterol: 180 mg/dL (ref <200)
HDL: 55 mg/dL (ref >=50)
LDL Cholesterol (Calc): 103 mg/dL (calc) — ABNORMAL HIGH
Non-HDL Cholesterol (Calc): 125 mg/dL (calc) (ref <130)
Total CHOL/HDL Ratio: 3.3 (calc) (ref <5.0)
Triglycerides: 122 mg/dL (ref <150)

## 2022-02-15 LAB — HEMOGLOBIN A1C
Hgb A1c MFr Bld: 5.8 % of total Hgb — ABNORMAL HIGH (ref <5.7)
Mean Plasma Glucose: 120 mg/dL
eAG (mmol/L): 6.6 mmol/L

## 2022-02-15 LAB — VITAMIN D 25 HYDROXY (VIT D DEFICIENCY, FRACTURES): Vit D, 25-Hydroxy: 42 ng/mL (ref 30–100)

## 2022-02-15 LAB — TSH: TSH: 1.25 mIU/L

## 2022-02-20 ENCOUNTER — Other Ambulatory Visit: Payer: Self-pay | Admitting: Internal Medicine

## 2022-02-20 DIAGNOSIS — G8929 Other chronic pain: Secondary | ICD-10-CM

## 2022-02-20 MED ORDER — MELOXICAM 15 MG PO TABS: ORAL_TABLET | ORAL | 3 refills | Status: DC

## 2022-03-23 DIAGNOSIS — G44219 Episodic tension-type headache, not intractable: Secondary | ICD-10-CM | POA: Diagnosis not present

## 2022-03-23 DIAGNOSIS — B354 Tinea corporis: Secondary | ICD-10-CM | POA: Diagnosis not present

## 2022-04-14 ENCOUNTER — Ambulatory Visit (INDEPENDENT_AMBULATORY_CARE_PROVIDER_SITE_OTHER): Payer: BC Managed Care – PPO | Admitting: Nurse Practitioner

## 2022-04-14 ENCOUNTER — Other Ambulatory Visit: Payer: Self-pay

## 2022-04-14 ENCOUNTER — Encounter: Payer: Self-pay | Admitting: Nurse Practitioner

## 2022-04-14 VITALS — BP 118/70 | HR 74 | Temp 98.0°F | Resp 17 | Ht 63.0 in | Wt 152.6 lb

## 2022-04-14 DIAGNOSIS — G4483 Primary cough headache: Secondary | ICD-10-CM | POA: Diagnosis not present

## 2022-04-14 DIAGNOSIS — Z1152 Encounter for screening for COVID-19: Secondary | ICD-10-CM | POA: Diagnosis not present

## 2022-04-14 DIAGNOSIS — E663 Overweight: Secondary | ICD-10-CM

## 2022-04-14 DIAGNOSIS — J0100 Acute maxillary sinusitis, unspecified: Secondary | ICD-10-CM | POA: Diagnosis not present

## 2022-04-14 LAB — POC COVID19 BINAXNOW: SARS Coronavirus 2 Ag: NEGATIVE

## 2022-04-14 LAB — POC INFLUENZA A&B (BINAX/QUICKVUE)
Influenza A, POC: NEGATIVE
Influenza B, POC: NEGATIVE

## 2022-04-14 MED ORDER — AMOXICILLIN 500 MG PO TABS: 500.0000 mg | ORAL_TABLET | Freq: Three times a day (TID) | ORAL | 0 refills | Status: DC

## 2022-04-14 NOTE — Progress Notes (Signed)
Assessment and Plan: [name] was seen today for headache.  Diagnoses and all orders for this visit:  Cough headache -     POC Influenza A&B (Binax test) - negative -     POC COVID-19- negative  Overweight (BMI 25.0-29.9) Continue diet and exercise  Acute maxillary sinusitis, recurrence not specified Push fluids Continue antihistamine and Nasacort Use Mucinex DM as needed Begin Amoxicillin, if no improvement in the next 5 days notify the office -     amoxicillin (AMOXIL) 500 MG tablet; Take 1 tablet (500 mg total) by mouth 3 (three) times daily. 10 days       Further disposition pending results of labs. Discussed med's effects and SE's.   Over 30 minutes of exam, counseling, chart review, and critical decision making was performed.   Future Appointments  Date Time Provider [place]  02/15/2023  9:00 AM [place], Kenney Houseman, NP [id number] None    ------------------------------------------------------------------------------------------------------------------   HPI BP 118/70   Pulse 74   Temp 98 F (36.7 C)   Resp 17   Ht '5\' 3"'$  (1.6 m)   Wt 152 lb 9.6 oz (69.2 kg)   SpO2 98%   BMI 27.03 kg/m   51 y.o.female presents for Headache, , sore throat, low grade fever, coughing up some green mucus, lots of facial pressure.     BMI is Body mass index is 27.03 kg/m., she has been working on diet and exercise. 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 Takes 1 tablet on mondays, wednesdays, and fridays   bisoprolol (ZEBETA) 5 MG tablet Take 1 tablet by mouth once daily for blood pressure (Patient taking differently: Take 1/2 tablet by mouth once daily for blood pressure)   BREO ELLIPTA 200-25 MCG/ACT AEPB Inhale 1 puff by mouth once daily   cholecalciferol (VITAMIN D) 1000 UNITS tablet Take 5,000 Units by mouth daily.    ezetimibe (ZETIA) 10 MG tablet Take  1 tablet  Daily  for Cholesterol                                         /                                      TAKE  BY                 MOUTH   famotidine (PEPCID) 20 MG tablet Take 20 mg by mouth 2 (two) times daily.    gemfibrozil (LOPID) 600 MG tablet TAKE 1 TABLET BY MOUTH TWICE DAILY WITH  MEALS  FOR  TRIGLYCERIDES  (BLOOD  FATS)   guaiFENesin (MUCINEX) 600 MG 12 hr tablet Take 600 mg by mouth as needed.    hydrocortisone (PROCTOSOL HC) 2.5 % rectal cream Place 1 Application rectally as needed for hemorrhoids or anal itching. Apply 2 to 4 x /day as needed for Rectal Pruritis   Magnesium 400 MG CAPS Take 400 mg by mouth daily.   meloxicam (MOBIC) 15 MG tablet Take  1/2 to 1 tablet  Daily  with Food  for Pain & Inflammation & try limit to 4-5 tabs /week to avoid Kidney Damage                                                                                         /                                                       TAKE                                 BY                              MOUTH   Omega-3 Fatty Acids (FISH OIL) 1000 MG CAPS Take 1 capsule by mouth daily.   triamcinolone (NASACORT) 55 MCG/ACT AERO nasal inhaler Nasacort   vitamin E 400 UNIT capsule Take 400 Units by mouth daily.    Current  Facility-Administered Medications on File Prior to Visit  Medication   0.9 %  sodium chloride infusion    ROS: all negative except above.   Physical Exam:  BP 118/70   Pulse 74   Temp 98 F (36.7 C)   Resp 17   Ht '5\' 3"'$  (1.6 m)   Wt 152 lb 9.6 oz (69.2 kg)   SpO2 98%   BMI 27.03 kg/m   General Appearance: Well nourished, in no apparent distress. Eyes: PERRLA, EOMs, conjunctiva no swelling or erythema Sinuses: Pronounced maxillary tenderness ENT/Mouth: Ext aud canals clear, TMs without dull and bulging. Erythema but no swelling, or exudate on post pharynx.   Hearing normal.  Neck: Supple, thyroid normal.  Respiratory: Respiratory effort normal, BS equal bilaterally without rales, rhonchi, wheezing or stridor.  Cardio: RRR with no MRGs. Brisk peripheral pulses without edema.  Abdomen: Soft, + BS.  Non tender, no guarding, rebound, hernias, masses. Lymphatics: Positive tender left submandibular adenopathy Musculoskeletal: Full ROM, 5/5 strength, normal gait.  Skin: Warm, dry without rashes, lesions, ecchymosis.  Neuro: Cranial nerves intact. Normal muscle tone, no cerebellar symptoms. Sensation intact.  Psych: Awake and oriented X 3, normal affect, Insight and Judgment appropriate.     Alycia Rossetti, NP 10:04 AM [name]

## 2022-04-14 NOTE — Patient Instructions (Signed)

## 2022-04-28 ENCOUNTER — Ambulatory Visit (INDEPENDENT_AMBULATORY_CARE_PROVIDER_SITE_OTHER): Payer: BC Managed Care – PPO | Admitting: Nurse Practitioner

## 2022-04-28 ENCOUNTER — Encounter: Payer: Self-pay | Admitting: Nurse Practitioner

## 2022-04-28 VITALS — BP 120/80 | HR 64 | Temp 98.2°F | Ht 63.0 in | Wt 152.0 lb

## 2022-04-28 DIAGNOSIS — L739 Follicular disorder, unspecified: Secondary | ICD-10-CM | POA: Diagnosis not present

## 2022-04-28 DIAGNOSIS — L539 Erythematous condition, unspecified: Secondary | ICD-10-CM

## 2022-04-28 MED ORDER — SULFAMETHOXAZOLE-TRIMETHOPRIM 400-80 MG PO TABS: 1.0000 | ORAL_TABLET | Freq: Every day | ORAL | 0 refills | Status: AC

## 2022-04-28 NOTE — Progress Notes (Signed)
Assessment and Plan:  [name] was seen today for an episodic visit .  Diagnoses and all order for this visit:  Folliculitis Healing well Apply a warm compress several times throughout the day to allow for area to drain. Continue to apply topical Mupiricon PRN Start Bactrim if no improvement in 48-72 hours. Continue to monitor for increase in fever, chills, N/V. Contact office if noticed.  - sulfamethoxazole-trimethoprim (BACTRIM) 400-80 MG tablet; Take 1 tablet by mouth daily for 5 doses.  Dispense: 5 tablet; Refill: 0  Erythema Monitor for spreading of redness, fever, chills. Contact office if noticed.   Notify office for further evaluation and treatment, questions or concerns if s/s fail to improve. The risks and benefits of my recommendations, as well as other treatment options were discussed with the patient today. Questions were answered.   Notify office for further evaluation and treatment, questions or concerns if any reported s/s fail to improve.   The patient was advised to call back or seek an in-person evaluation if any symptoms worsen or if the condition fails to improve as anticipated.   Further disposition pending results of labs. Discussed med's effects and SE's.    I discussed the assessment and treatment plan with the patient. The patient was provided an opportunity to ask questions and all were answered. The patient agreed with the plan and demonstrated an understanding of the instructions.  Discussed med's effects and SE's. Screening labs and tests as requested with regular follow-up as recommended.  I provided 15 minutes of face-to-face time during this encounter including counseling, chart review, and critical decision making was preformed.   Future Appointments  Date Time Provider [place]  02/15/2023  9:00 AM Johnathyn Viscomi, Kenney Houseman, NP [facility] None     ------------------------------------------------------------------------------------------------------------------   HPI BP 120/80   Pulse 64   Temp 98.2 F (36.8 C)   Ht 5' 3"$  (1.6 m)   Wt 152 lb (68.9 kg)   SpO2 99%   BMI 26.93 kg/m     [name] is a 52 y.o. female who presents for evaluation of a possible skin infection located on lower abdomen RLQ area onset 5 days ago. Symptoms include  erythema, mild pain . Patient denies chills and fever greater than 100. Precipitating event: none known. Treatment to date has included  topical Mupirocin  with moderate relief.  She had placed a topical acne patch over the area which produced purulent drainage.    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 10 days   aspirin 81 MG tablet Take 81 mg by mouth See admin instructions. Takes 1 tablet on mondays, wednesdays, and fridays  bisoprolol (ZEBETA) 5 MG tablet Take 1 tablet by mouth once daily for blood pressure (Patient taking differently: Take 1/2 tablet by mouth once daily for blood pressure)   BREO ELLIPTA 200-25 MCG/ACT AEPB Inhale 1 puff by mouth once daily   cholecalciferol (VITAMIN D) 1000 UNITS tablet Take 5,000 Units by mouth daily.    ezetimibe (ZETIA) 10 MG tablet Take  1 tablet  Daily  for Cholesterol                                          /                                      TAKE                   BY                 MOUTH   famotidine (PEPCID) 20 MG tablet Take 20 mg by mouth 2 (two) times daily.    gemfibrozil (LOPID) 600 MG tablet TAKE 1 TABLET BY MOUTH TWICE DAILY WITH  MEALS  FOR  TRIGLYCERIDES  (BLOOD  FATS)   guaiFENesin (MUCINEX) 600 MG 12 hr tablet Take 600 mg by mouth as needed.    hydrocortisone (PROCTOSOL HC) 2.5 % rectal cream Place 1 Application rectally as needed for hemorrhoids or anal itching. Apply 2 to 4 x /day as needed for Rectal Pruritis   Magnesium 400 MG CAPS Take 400 mg by mouth daily.   meloxicam (MOBIC) 15 MG tablet Take  1/2 to 1 tablet  Daily  with Food  for Pain & Inflammation & try limit to 4-5 tabs /week to avoid Kidney Damage                                                                                         /                                                       TAKE                                 BY                              MOUTH   Omega-3 Fatty Acids (FISH OIL) 1000 MG CAPS Take 1 capsule by mouth daily.   triamcinolone (NASACORT) 55 MCG/ACT AERO nasal inhaler Nasacort   vitamin E 400 UNIT capsule Take 400 Units by mouth daily.    Current Facility-Administered Medications on File Prior to Visit  Medication   0.9 %  sodium chloride infusion    ROS: all negative except what  is noted in the HPI.   Physical Exam:  BP 120/80   Pulse 64   Temp 98.2 F (36.8 C)   Ht 5' 3"$  (1.6 m)   Wt 152 lb (68.9 kg)   SpO2 99%   BMI 26.93 kg/m   General Appearance: NAD.  Awake, conversant and cooperative. Eyes: PERRLA, EOMs intact.  Sclera white.  Conjunctiva without erythema. Sinuses: No frontal/maxillary tenderness.  No nasal discharge. Nares patent.  ENT/Mouth: Ext aud canals clear.  Bilateral TMs w/DOL and without erythema or bulging. Hearing intact.  Posterior pharynx without swelling or exudate.  Tonsils without swelling or erythema.  Neck: Supple.  No  masses, nodules or thyromegaly. Respiratory: Effort is regular with non-labored breathing. Breath sounds are equal bilaterally without rales, rhonchi, wheezing or stridor.  Cardio: RRR with no MRGs. Brisk peripheral pulses without edema.  Abdomen: Active BS in all four quadrants.  Soft and non-tender without guarding, rebound tenderness, hernias or masses. Lymphatics: Non tender without lymphadenopathy.  Musculoskeletal: Full ROM, 5/5 strength, normal ambulation.  No clubbing or cyanosis. Skin: RLQ abdomen with flat erythematous approximately 0.5 mm round macule with slight induration to skin.  No nodule or mass noted upon palpation.  Appropriate color for ethnicity. Surrounding skin WNL.   Neuro: CN II-XII grossly normal. Normal muscle tone without cerebellar symptoms and intact sensation.   Psych: AO X 3,  appropriate mood and affect, insight and judgment.     [name], NP 9:00 AM [facility]

## 2022-05-05 DIAGNOSIS — Z01419 Encounter for gynecological examination (general) (routine) without abnormal findings: Secondary | ICD-10-CM | POA: Diagnosis not present

## 2022-05-05 DIAGNOSIS — Z1231 Encounter for screening mammogram for malignant neoplasm of breast: Secondary | ICD-10-CM | POA: Diagnosis not present

## 2022-05-05 DIAGNOSIS — Z6826 Body mass index (BMI) 26.0-26.9, adult: Secondary | ICD-10-CM | POA: Diagnosis not present

## 2022-05-09 ENCOUNTER — Other Ambulatory Visit: Payer: Self-pay | Admitting: Nurse Practitioner

## 2022-05-09 ENCOUNTER — Other Ambulatory Visit: Payer: Self-pay | Admitting: Internal Medicine

## 2022-05-09 DIAGNOSIS — E782 Mixed hyperlipidemia: Secondary | ICD-10-CM

## 2022-05-09 DIAGNOSIS — K219 Gastro-esophageal reflux disease without esophagitis: Secondary | ICD-10-CM

## 2022-05-11 ENCOUNTER — Encounter: Payer: Self-pay | Admitting: Nurse Practitioner

## 2022-05-16 ENCOUNTER — Other Ambulatory Visit: Payer: Self-pay | Admitting: Internal Medicine

## 2022-05-16 DIAGNOSIS — E782 Mixed hyperlipidemia: Secondary | ICD-10-CM

## 2022-05-16 MED ORDER — BISOPROLOL FUMARATE 5 MG PO TABS: ORAL_TABLET | ORAL | 3 refills | Status: DC

## 2022-05-23 DIAGNOSIS — B081 Molluscum contagiosum: Secondary | ICD-10-CM | POA: Diagnosis not present

## 2022-05-23 DIAGNOSIS — L821 Other seborrheic keratosis: Secondary | ICD-10-CM | POA: Diagnosis not present

## 2022-05-23 DIAGNOSIS — L538 Other specified erythematous conditions: Secondary | ICD-10-CM | POA: Diagnosis not present

## 2022-05-23 DIAGNOSIS — L710 Perioral dermatitis: Secondary | ICD-10-CM | POA: Diagnosis not present

## 2022-05-23 DIAGNOSIS — D225 Melanocytic nevi of trunk: Secondary | ICD-10-CM | POA: Diagnosis not present

## 2022-05-23 DIAGNOSIS — L814 Other melanin hyperpigmentation: Secondary | ICD-10-CM | POA: Diagnosis not present

## 2022-05-26 DIAGNOSIS — M1711 Unilateral primary osteoarthritis, right knee: Secondary | ICD-10-CM | POA: Diagnosis not present

## 2022-05-31 ENCOUNTER — Other Ambulatory Visit: Payer: Self-pay

## 2022-06-06 ENCOUNTER — Other Ambulatory Visit: Payer: Self-pay | Admitting: Nurse Practitioner

## 2022-06-14 NOTE — Telephone Encounter (Signed)
Patient is due for a renewal of her Trulance

## 2022-06-22 NOTE — Telephone Encounter (Signed)
PA is approved through 08/06/2022. Insurance will not let us submit a renewal at this time.

## 2022-06-27 ENCOUNTER — Telehealth: Payer: Self-pay | Admitting: Pharmacy Technician

## 2022-06-27 NOTE — Telephone Encounter (Signed)
Patient Advocate Encounter  Received notification from [facility] that prior authorization RENEWAL for TRULANCE  is due.   PA was submitted on 4.11.24, however it was cancelled because active PA still on file until 5.26.24. [id number].  In further review TRULANCE was cancelled by provider on 12.11.23. No further action is required.

## 2022-07-10 ENCOUNTER — Other Ambulatory Visit (HOSPITAL_COMMUNITY): Payer: Self-pay

## 2022-07-10 ENCOUNTER — Telehealth: Payer: Self-pay | Admitting: Pharmacy Technician

## 2022-08-30 DIAGNOSIS — Z0000 Encounter for general adult medical examination without abnormal findings: Secondary | ICD-10-CM | POA: Diagnosis not present

## 2022-08-30 DIAGNOSIS — Z131 Encounter for screening for diabetes mellitus: Secondary | ICD-10-CM | POA: Diagnosis not present

## 2022-08-30 DIAGNOSIS — Z79899 Other long term (current) drug therapy: Secondary | ICD-10-CM | POA: Diagnosis not present

## 2022-08-30 DIAGNOSIS — E559 Vitamin D deficiency, unspecified: Secondary | ICD-10-CM | POA: Diagnosis not present

## 2022-08-30 DIAGNOSIS — Z13220 Encounter for screening for lipoid disorders: Secondary | ICD-10-CM | POA: Diagnosis not present

## 2022-08-30 DIAGNOSIS — Z1389 Encounter for screening for other disorder: Secondary | ICD-10-CM | POA: Diagnosis not present

## 2022-09-01 NOTE — Telephone Encounter (Signed)
ERROR

## 2022-09-05 ENCOUNTER — Other Ambulatory Visit: Payer: Self-pay | Admitting: Nurse Practitioner

## 2022-10-11 DIAGNOSIS — E782 Mixed hyperlipidemia: Secondary | ICD-10-CM | POA: Diagnosis not present

## 2022-10-11 DIAGNOSIS — J4530 Mild persistent asthma, uncomplicated: Secondary | ICD-10-CM | POA: Diagnosis not present

## 2022-10-11 DIAGNOSIS — Z111 Encounter for screening for respiratory tuberculosis: Secondary | ICD-10-CM | POA: Diagnosis not present

## 2022-10-11 DIAGNOSIS — E663 Overweight: Secondary | ICD-10-CM | POA: Diagnosis not present

## 2022-10-11 DIAGNOSIS — F902 Attention-deficit hyperactivity disorder, combined type: Secondary | ICD-10-CM | POA: Diagnosis not present

## 2022-10-27 ENCOUNTER — Other Ambulatory Visit: Payer: Self-pay | Admitting: Nurse Practitioner

## 2022-10-27 DIAGNOSIS — E782 Mixed hyperlipidemia: Secondary | ICD-10-CM

## 2022-11-22 ENCOUNTER — Encounter: Payer: Self-pay | Admitting: Nurse Practitioner

## 2022-11-29 ENCOUNTER — Ambulatory Visit (INDEPENDENT_AMBULATORY_CARE_PROVIDER_SITE_OTHER): Payer: BC Managed Care – PPO | Admitting: Nurse Practitioner

## 2022-11-29 ENCOUNTER — Encounter: Payer: Self-pay | Admitting: Nurse Practitioner

## 2022-11-29 VITALS — BP 110/74 | HR 62 | Temp 97.5°F | Ht 63.0 in | Wt 143.0 lb

## 2022-11-29 DIAGNOSIS — L659 Nonscarring hair loss, unspecified: Secondary | ICD-10-CM

## 2022-11-29 DIAGNOSIS — R1314 Dysphagia, pharyngoesophageal phase: Secondary | ICD-10-CM

## 2022-11-29 DIAGNOSIS — F419 Anxiety disorder, unspecified: Secondary | ICD-10-CM

## 2022-11-29 DIAGNOSIS — Z8349 Family history of other endocrine, nutritional and metabolic diseases: Secondary | ICD-10-CM | POA: Diagnosis not present

## 2022-11-29 NOTE — Patient Instructions (Addendum)
YOU CAN CALL TO MAKE AN ULTRASOUND..  I have put in an order for an ultrasound for you to have You can set them up at your convenience by calling this number [phone] You will likely have the ultrasound at [address]  If you have any issues call our office and we will set this up for you.   Alopecia Areata, Adult  Alopecia areata is a condition that causes hair loss. A person with this condition may lose hair on the scalp in patches. In some cases, a person may lose all the hair on the scalp or all the hair from the face and body. Having this condition can be emotionally difficult, but it is not dangerous. Alopecia areata is an autoimmune disease. This means that your body's defense system (immune system) mistakes normal parts of the body for germs or other things that can make you sick. When you have alopecia areata, the immune system attacks the hair follicles. What are the causes? The cause of this condition is not known. What increases the risk? You are more likely to develop this condition if you have: A family history of alopecia. A family history of another autoimmune disease, including type 1 diabetes and thyroid autoimmune disease. Eczema, asthma, and allergies. Down syndrome. What are the signs or symptoms? The main symptom of this condition is round spots of patchy hair loss on the scalp. The spots may be mildly itchy. Other symptoms include: Short dark hairs in the bald patches that are wider at the top (exclamation point hairs). Dents, white spots, or lines in the fingernails or toenails. Balding and body hair loss. This is rare. Alopecia areata usually develops in childhood, but it can develop at any age. For some people, their hair grows back on its own and hair loss does not happen again. For others, their hair may fall out and grow back in cycles. The hair loss may last many years. How is this diagnosed? This condition is diagnosed based on your  symptoms and family history. Your health care provider will also check your scalp skin, teeth, and nails. Your health care provider may refer you to a specialist in hair and skin disorders (dermatologist). You may also have tests, including: A hair pull test. Blood tests or other screening tests to check for autoimmune diseases, such as thyroid disease or diabetes. Skin biopsy to confirm the diagnosis. A procedure to examine the skin with a lighted magnifying instrument (dermoscopy). How is this treated? There is no cure for alopecia areata. The goals of treatment are to promote the regrowth of hair and prevent the immune system from overreacting. No single treatment is right for all people with alopecia areata. It depends on the type of hair loss you have and how severe it is. Work with your health care provider to find the best treatment for you. Treatment may include: Regular checkups to make sure the condition is not getting worse . This is called watchful waiting. Using steroid creams or pills for 6-8 weeks to stop the immune reaction and help hair to regrow more quickly. Using other medicines on your skin (topical medicines) to change the immune system response and support the hair growth cycle. Steroid injections. Therapy and counseling with a support group or therapist if you are having trouble coping with hair loss. Follow these instructions at home: Medicines Apply topical creams only as told by your health care provider. Take over-the-counter and prescription medicines only as  told by your health care provider. General instructions Learn as much as you can about your condition. Consider getting a wig or products to make hair look fuller or to cover bald spots, if you feel uncomfortable with your appearance. Get therapy or counseling if you are having a hard time coping with hair loss. Ask your health care provider to recommend a counselor or support group. Keep all follow-up visits  as told by your health care provider. This is important. Where to find more information National Alopecia Areata Foundation: naaf.org Contact a health care provider if: Your hair loss gets worse, even with treatment. You have new symptoms. You are struggling emotionally. Get help right away if: You have a sudden worsening of the hair loss. Summary Alopecia areata is an autoimmune condition that makes your body's defense system (immune system) attack the hair follicles. This causes you to lose hair. Having this condition can be emotionally difficult, but it is not dangerous. Treatments may include regular checkups to make sure that the condition is not getting worse, medicines, and steroid injections. This information is not intended to replace advice given to you by your health care provider. Make sure you discuss any questions you have with your health care provider. Document Revised: 05/09/2019 Document Reviewed: 05/13/2019 Elsevier Patient Education  2024 [employer].

## 2022-11-29 NOTE — Progress Notes (Signed)
Assessment and Plan:  AISHAT WOLLIN was seen today for an episodic visit.  Diagnoses and all order for this visit:  Hair loss Continue to monitor  - Iron, TIBC and Ferritin Panel - Zinc - TSH - T3 - T4, Free - US THYROID; Future  Pharyngoesophageal dysphagia Continue to monitor for increase in dysphagia symptoms Discussed GERD symptoms and use of H2 blocker PRN  - Iron, TIBC and Ferritin Panel - Zinc - TSH - T3 - T4, Free - US THYROID; Future  Family history of thyroid disease Obtain US thyroid for evaluation of posterior thyroid/assess for nodules or m ass considering associated new onset dysphagia.  - Iron, TIBC and Ferritin Panel - Zinc - TSH - T3 - T4, Free - US THYROID; Future  Anxiety Continue Bisoprolol  Reviewed relaxation techniques.  Sleep hygiene. Recommended mindfulness meditation and exercise.   Encouraged personality growth wand development through coping techniques and problem-solving skills. Limit/Decrease/Monitor drug/alcohol intake.    - Iron, TIBC and Ferritin Panel - Zinc - TSH - T3 - T4, Free - US THYROID; Future   Notify office for further evaluation and treatment, questions or concerns if s/s fail to improve. The risks and benefits of my recommendations, as well as other treatment options were discussed with the patient today. Questions were answered.  Further disposition pending results of labs. Discussed med's effects and SE's.    Over 20 minutes of exam, counseling, chart review, and critical decision making was performed.   Future Appointments  Date Time Provider Department Center  02/15/2023  9:00 AM Adela Glimpse, NP [facility] None    ------------------------------------------------------------------------------------------------------------------   HPI BP 110/74   Pulse 62   Temp (!) 97.5 F (36.4 C)   Ht 5\' 3"  (1.6 m)   Wt 143 lb (64.9 kg)   SpO2 97%   BMI 25.33 kg/m   52 y.o.female presents for evaluation  of new onset hair loss with dysphagia.  Patient states that for the last month she has noticed and increase in hair loss.  Reports clumps of hair falling out in the shower and when brushing.  Associated dysphagia with globulus sensation.  No choking.  Some increase in anxiety, denies insomnia.  She does take daily beta blocker which helps to control the anxiety.  Denies heart palpitations however could be well controlled with beta blocker.  She has recently learned that her father passes from a thyroid disorder.  She does no have a relationship with her father therefore the underlying cause was unknown.  She denies every have a covid infection.   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 Takes 1 tablet on mondays, wednesdays, and fridays   bisoprolol (ZEBETA)  5 MG tablet Take 1 tablet by  Daily for BP   BREO ELLIPTA 200-25 MCG/ACT AEPB Inhale 1 puff by mouth once daily   cholecalciferol (VITAMIN D) 1000 UNITS tablet Take 5,000 Units by mouth daily.    doxycycline (VIBRAMYCIN) 50 MG capsule Take 50 mg by mouth 2 (two) times daily as needed.   ezetimibe (ZETIA) 10 MG tablet Take  1 tablet  Daily for Cholesterol                                              /                                       TAKE                                       BY                                              MOUTH                                                     ONCE DAILY   famotidine (PEPCID) 20 MG tablet Take 20 mg by mouth 2 (two) times daily.    gemfibrozil (LOPID) 600 MG tablet Take  1 tablet   2 x /day  with Meals  for Triglycerides (Blood Fats)                                  /                                                                   TAKE                                         BY                                                 MOUTH   guaiFENesin (MUCINEX) 600 MG 12 hr tablet Take 600 mg by mouth as needed.    hydrocortisone (PROCTOSOL HC) 2.5 % rectal cream Place 1 Application rectally as needed for hemorrhoids or anal itching. Apply 2 to 4 x /day as needed for Rectal Pruritis   Magnesium 400 MG CAPS Take 400 mg by mouth daily.   meloxicam (MOBIC) 15 MG tablet Take  1/2 to  1 tablet  Daily  with Food  for Pain & Inflammation & try limit to 4-5 tabs /week to avoid Kidney Damage                                                                                         /                                                       TAKE                                 BY                              MOUTH   Omega-3 Fatty Acids (FISH OIL) 1000 MG CAPS Take 1 capsule by mouth daily.   triamcinolone (NASACORT) 55 MCG/ACT AERO nasal inhaler Nasacort   vitamin E 400 UNIT capsule Take 400 Units by mouth daily.    amoxicillin (AMOXIL) 500 MG tablet Take 1 tablet (500 mg total) by mouth 3 (three) times daily. 10 days (Patient not taking: Reported on 11/29/2022)   Current Facility-Administered Medications on File Prior to Visit  Medication   0.9 %  sodium chloride infusion    ROS: all negative except what is noted in the HPI.   Physical Exam:  BP 110/74   Pulse 62   Temp (!) 97.5 F (36.4 C)   Ht 5\' 3"  (1.6 m)   Wt 143 lb (64.9 kg)   SpO2 97%   BMI 25.33 kg/m   General Appearance: NAD.  Awake, conversant and cooperative. Eyes: PERRLA, EOMs intact.  Sclera white.  Conjunctiva  without erythema. Sinuses: No frontal/maxillary tenderness.  No nasal discharge. Nares patent.  ENT/Mouth: Ext aud canals clear.  Bilateral TMs w/DOL and without erythema or bulging. Hearing intact.  Posterior pharynx without swelling or exudate.  Tonsils without swelling or erythema.  Neck: Supple.  No masses, nodules or thyromegaly. Respiratory: Effort is regular with non-labored breathing. Breath sounds are equal bilaterally without rales, rhonchi, wheezing or stridor.  Cardio: RRR with no MRGs. Brisk peripheral pulses without edema.  Abdomen: Active BS in all four quadrants.  Soft and non-tender without guarding, rebound tenderness, hernias or masses. Lymphatics: Non tender without lymphadenopathy.  Musculoskeletal: Full ROM, 5/5 strength, normal ambulation.  No clubbing or cyanosis. Skin: Appropriate color for ethnicity. Warm without rashes, lesions, ecchymosis, ulcers.  Neuro: CN II-XII grossly normal. Normal muscle tone without cerebellar symptoms and intact sensation.   Psych: AO X 3,  appropriate mood and affect, insight and judgment.     Adela Glimpse, NP 11:58 AM [facility]

## 2022-11-30 ENCOUNTER — Other Ambulatory Visit: Payer: Self-pay | Admitting: Nurse Practitioner

## 2022-11-30 DIAGNOSIS — J4541 Moderate persistent asthma with (acute) exacerbation: Secondary | ICD-10-CM

## 2022-12-01 ENCOUNTER — Encounter: Payer: Self-pay | Admitting: Nurse Practitioner

## 2022-12-05 ENCOUNTER — Encounter: Payer: Self-pay | Admitting: Nurse Practitioner

## 2022-12-05 ENCOUNTER — Other Ambulatory Visit: Payer: BC Managed Care – PPO

## 2022-12-06 ENCOUNTER — Telehealth: Payer: Self-pay

## 2022-12-06 ENCOUNTER — Other Ambulatory Visit: Payer: Self-pay | Admitting: Nurse Practitioner

## 2022-12-06 DIAGNOSIS — B3731 Acute candidiasis of vulva and vagina: Secondary | ICD-10-CM

## 2022-12-06 LAB — IRON,TIBC AND FERRITIN PANEL
%SAT: 28 % (calc) (ref 16–45)
Ferritin: 61 ng/mL (ref 16–232)
Iron: 98 ug/dL (ref 45–160)
TIBC: 352 mcg/dL (calc) (ref 250–450)

## 2022-12-06 LAB — TSH: TSH: 0.82 mIU/L

## 2022-12-06 LAB — T3: T3, Total: 128 ng/dL (ref 76–181)

## 2022-12-06 LAB — ZINC: Zinc: 115 ug/dL (ref 60–130)

## 2022-12-06 LAB — T4, FREE: Free T4: 1 ng/dL (ref 0.8–1.8)

## 2022-12-06 MED ORDER — FLUCONAZOLE 150 MG PO TABS: ORAL_TABLET | ORAL | 0 refills | Status: DC

## 2022-12-06 NOTE — Telephone Encounter (Signed)
Refill request for Fluconazole, 150mg , Qty: 8

## 2022-12-11 ENCOUNTER — Other Ambulatory Visit: Payer: BC Managed Care – PPO

## 2022-12-26 ENCOUNTER — Other Ambulatory Visit: Payer: Self-pay | Admitting: Nurse Practitioner

## 2023-01-05 ENCOUNTER — Telehealth: Payer: Self-pay | Admitting: Pharmacy Technician

## 2023-01-05 NOTE — Telephone Encounter (Signed)
Pharmacy Patient Advocate Encounter   Received notification from [employer]MyMeds that prior authorization for TRULANCE 3MG  is required/requested.   Insurance verification completed.   The patient is insured through [facility] .   Per test claim: PA required; PA submitted to [facility] via CoverMyMeds Key/confirmation #/[id number] [facility] Status is pending

## 2023-02-05 ENCOUNTER — Encounter: Payer: Self-pay | Admitting: Nurse Practitioner

## 2023-02-05 ENCOUNTER — Ambulatory Visit (INDEPENDENT_AMBULATORY_CARE_PROVIDER_SITE_OTHER): Payer: BC Managed Care – PPO | Admitting: Nurse Practitioner

## 2023-02-05 VITALS — BP 120/78 | HR 77 | Temp 98.6°F | Ht 63.0 in | Wt 148.8 lb

## 2023-02-05 DIAGNOSIS — J0100 Acute maxillary sinusitis, unspecified: Secondary | ICD-10-CM | POA: Diagnosis not present

## 2023-02-05 DIAGNOSIS — R5383 Other fatigue: Secondary | ICD-10-CM | POA: Diagnosis not present

## 2023-02-05 MED ORDER — AZITHROMYCIN 250 MG PO TABS
ORAL_TABLET | ORAL | 1 refills | Status: DC
Start: 2023-02-05 — End: 2023-03-01

## 2023-02-05 MED ORDER — DEXAMETHASONE SODIUM PHOSPHATE 10 MG/ML IJ SOLN
10.0000 mg | Freq: Once | INTRAMUSCULAR | Status: AC
Start: 2023-02-05 — End: 2023-02-05
  Administered 2023-02-05: 10 mg via INTRAMUSCULAR

## 2023-02-05 NOTE — Progress Notes (Signed)
Assessment and Plan:  IYONA NANNI was seen today for an episodic visit.  Diagnoses and all order for this visit:  Acute maxillary sinusitis, recurrence not specified Start tmt with Z-Pak Steroid injection administered - patient tolerated well Stay well hydrated to keep mucus thin and productive  - azithromycin (ZITHROMAX) 250 MG tablet; Take 2 tablets on  Day 1,  followed by 1 tablet  daily for 4 more days    for Sinusitis  /Bronchitis  Dispense: 6 each; Refill: 1 - dexamethasone (DECADRON) injection 10 mg  Other fatigue Rest Push Fluids  Notify office for further evaluation and treatment, questions or concerns if s/s fail to improve. The risks and benefits of my recommendations, as well as other treatment options were discussed with the patient today. Questions were answered.  Further disposition pending results of labs. Discussed med's effects and SE's.    Over 20 minutes of exam, counseling, chart review, and critical decision making was performed.   Future Appointments  Date Time Provider Department Center  02/05/2023  2:15 PM Adela Glimpse, NP [facility] None  02/22/2023 11:30 AM Deannah Rossi, Archie Patten, NP [facility] None    ------------------------------------------------------------------------------------------------------------------   HPI BP 120/78   Pulse 77   Temp 98.6 F (37 C)   Ht 5\' 3"  (1.6 m)   Wt 148 lb 12.8 oz (67.5 kg)   SpO2 99%   BMI 26.36 kg/m    Patient complains of symptoms of a URI, possible sinusitis. Symptoms include achiness, headache described as pressure, low grade fever, nasal congestion, and sinus pressure. Onset of symptoms was 1 week ago, and has been unchanged since that time. Treatment to date: antihistamines, cough suppressants, decongestants, and nasal steroids. Denies fever, chills, N/V.    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 Takes 1 tablet on mondays, wednesdays, and fridays   bisoprolol (ZEBETA) 5 MG tablet Take 1 tablet by  Daily for BP   cholecalciferol (VITAMIN D) 1000 UNITS tablet Take 5,000 Units by mouth daily.    doxycycline (VIBRAMYCIN) 50 MG capsule Take 50 mg by mouth 2 (two) times daily as needed.   ezetimibe (ZETIA) 10 MG tablet Take  1 tablet  Daily for Cholesterol                                              /  TAKE                                       BY                                             MOUTH                                                     ONCE DAILY   famotidine (PEPCID) 20 MG tablet Take 20 mg by mouth 2 (two) times daily.    fluconazole (DIFLUCAN) 150 MG tablet Take 1 tablet (150 mg) at the sign of symptoms.  If not resolved after 72 hours may take additional 150 mg dosage.   fluticasone furoate-vilanterol (BREO ELLIPTA) 200-25 MCG/ACT AEPB INHALE 1 PUFF INTO LUNGS ONCE DAILY   gemfibrozil (LOPID) 600 MG tablet Take  1 tablet   2 x /day  with Meals  for Triglycerides (Blood Fats)                                  /                                                                   TAKE                                         BY                                                 MOUTH   guaiFENesin (MUCINEX) 600 MG 12 hr tablet Take 600 mg by mouth as needed.    hydrocortisone (PROCTOSOL HC) 2.5 % rectal cream Place  1 Application rectally as needed for hemorrhoids or anal itching. Apply 2 to 4 x /day as needed for Rectal Pruritis   Magnesium 400 MG CAPS Take 400 mg by mouth daily.   meloxicam (MOBIC) 15 MG tablet Take  1/2 to 1 tablet  Daily  with Food  for Pain & Inflammation & try limit to 4-5 tabs /week to avoid Kidney Damage                                                                                         /  TAKE                                 BY                              MOUTH   Omega-3 Fatty Acids (FISH OIL) 1000 MG CAPS Take 1 capsule by mouth daily.   triamcinolone (NASACORT) 55 MCG/ACT AERO nasal inhaler Nasacort   vitamin E 400 UNIT capsule Take 400 Units by mouth daily.    Current Facility-Administered Medications on File Prior to Visit  Medication   0.9 %  sodium chloride infusion    ROS: all negative except what is noted in the HPI.   Physical Exam:  BP 120/78   Pulse 77   Temp 98.6 F (37 C)   Ht 5\' 3"  (1.6 m)   Wt 148 lb 12.8 oz (67.5 kg)   SpO2 99%   BMI 26.36 kg/m   General Appearance: NAD.  Awake, conversant and cooperative. Eyes: PERRLA, EOMs intact.  Sclera white.  Conjunctiva without erythema. Sinuses: No frontal/maxillary tenderness.  No nasal discharge. Nares patent.  ENT/Mouth: Ext aud canals clear.  Bilateral TMs w/DOL and without erythema or bulging. Hearing intact.  Posterior pharynx without swelling or exudate.  Tonsils without swelling or erythema.  Neck: Supple.  No masses, nodules or thyromegaly. Respiratory: Effort is regular with non-labored breathing. Breath sounds are equal bilaterally without rales, rhonchi, wheezing or stridor.  Cardio: RRR with no MRGs. Brisk peripheral pulses without edema.  Abdomen: Active BS in all four quadrants.  Soft and non-tender without guarding, rebound tenderness, hernias or masses. Lymphatics: Non tender without lymphadenopathy.  Musculoskeletal: Full ROM, 5/5  strength, normal ambulation.  No clubbing or cyanosis. Skin: Appropriate color for ethnicity. Warm without rashes, lesions, ecchymosis, ulcers.  Neuro: CN II-XII grossly normal. Normal muscle tone without cerebellar symptoms and intact sensation.   Psych: AO X 3,  appropriate mood and affect, insight and judgment.     Adela Glimpse, NP 2:04 PM [facility]

## 2023-02-05 NOTE — Patient Instructions (Signed)

## 2023-02-15 ENCOUNTER — Encounter: Payer: BC Managed Care – PPO | Admitting: Nurse Practitioner

## 2023-02-22 ENCOUNTER — Ambulatory Visit (INDEPENDENT_AMBULATORY_CARE_PROVIDER_SITE_OTHER): Payer: BC Managed Care – PPO | Admitting: Nurse Practitioner

## 2023-02-22 VITALS — BP 110/80 | HR 74 | Temp 98.0°F | Ht 63.0 in | Wt 149.4 lb

## 2023-02-22 DIAGNOSIS — E559 Vitamin D deficiency, unspecified: Secondary | ICD-10-CM | POA: Diagnosis not present

## 2023-02-22 DIAGNOSIS — K581 Irritable bowel syndrome with constipation: Secondary | ICD-10-CM

## 2023-02-22 DIAGNOSIS — Z0001 Encounter for general adult medical examination with abnormal findings: Secondary | ICD-10-CM

## 2023-02-22 DIAGNOSIS — Z79899 Other long term (current) drug therapy: Secondary | ICD-10-CM | POA: Diagnosis not present

## 2023-02-22 DIAGNOSIS — I10 Essential (primary) hypertension: Secondary | ICD-10-CM | POA: Diagnosis not present

## 2023-02-22 DIAGNOSIS — Z87898 Personal history of other specified conditions: Secondary | ICD-10-CM

## 2023-02-22 DIAGNOSIS — J45909 Unspecified asthma, uncomplicated: Secondary | ICD-10-CM

## 2023-02-22 DIAGNOSIS — K829 Disease of gallbladder, unspecified: Secondary | ICD-10-CM

## 2023-02-22 DIAGNOSIS — Z13220 Encounter for screening for lipoid disorders: Secondary | ICD-10-CM | POA: Diagnosis not present

## 2023-02-22 DIAGNOSIS — N200 Calculus of kidney: Secondary | ICD-10-CM

## 2023-02-22 DIAGNOSIS — Z1329 Encounter for screening for other suspected endocrine disorder: Secondary | ICD-10-CM

## 2023-02-22 DIAGNOSIS — K219 Gastro-esophageal reflux disease without esophagitis: Secondary | ICD-10-CM

## 2023-02-22 DIAGNOSIS — Z1389 Encounter for screening for other disorder: Secondary | ICD-10-CM

## 2023-02-22 DIAGNOSIS — Z0000 Encounter for general adult medical examination without abnormal findings: Secondary | ICD-10-CM | POA: Diagnosis not present

## 2023-02-22 DIAGNOSIS — Z136 Encounter for screening for cardiovascular disorders: Secondary | ICD-10-CM

## 2023-02-22 DIAGNOSIS — Z131 Encounter for screening for diabetes mellitus: Secondary | ICD-10-CM

## 2023-02-22 DIAGNOSIS — E663 Overweight: Secondary | ICD-10-CM

## 2023-02-22 DIAGNOSIS — E785 Hyperlipidemia, unspecified: Secondary | ICD-10-CM

## 2023-02-22 DIAGNOSIS — Z130 Encounter for screening for diseases of the blood and blood-forming organs and certain disorders involving the immune mechanism: Secondary | ICD-10-CM

## 2023-02-22 DIAGNOSIS — G8929 Other chronic pain: Secondary | ICD-10-CM

## 2023-02-22 NOTE — Progress Notes (Signed)
Complete Physical  Assessment and Plan:  [name] was seen today for annual exam.  Diagnoses and all orders for this visit:  Encounter for Annual Physical Exam with abnormal findings Due annually  Health Maintenance reviewed Healthy lifestyle reviewed and goals set  Uncomplicated asthma, unspecified asthma severity, unspecified whether persistent Controlled without recent flares Avoid triggers, continue medications  Irritable bowel syndrome, constipation Well controlled with current regimen,  Continue lifestyle modifications.   Gall bladder disease Resolved Cholecystectomy   Renal stone None recent flares Stay well hydrated Has seen urology Dr. Vernie Ammons in 2016 Continue to monitor  Vitamin D deficiency Continue supplement Monitor levels  BMI 27 Discussed appropriate BMI Diet modification. Physical activity. Encouraged/praised to build confidence.  Hx of prediabetes Education: Reviewed 'ABCs' of diabetes management  Discussed goals to be met and/or maintained include A1C (<7) Blood pressure (<130/80) Cholesterol (LDL <70) Continue Eye Exam yearly  Continue Dental Exam Q6 mo Discussed dietary recommendations Discussed Physical Activity recommendations Check A1C  Medication management All medications discussed and reviewed in full. All questions and concerns regarding medications addressed.    Hyperlipidemia, unspecified hyperlipidemia type/ statin myopathy Discussed lifestyle modifications. Recommended diet heavy in fruits and veggies, omega 3's. Decrease consumption of animal meats, cheeses, and dairy products. Remain active and exercise as tolerated. Continue to monitor. Check lipids/TSH  Gastroesophageal reflux disease, esophagitis presence not specified Continue medication PRN No suspected reflux complications (Barret/stricture). Lifestyle modification:  wt loss, avoid meals 2-3h before bedtime. Consider eliminating food triggers:  chocolate,  caffeine, EtOH, acid/spicy food.  Screening for cardiovascular condition Check and monitor EKG  Screening for thyroid disorder Check TSH  Screening for proteinuria and hematuria Check UA/Microalbumin  Bil knee pain May need new referral back to [facility] Continue meloxicam as directed RICE when flared.  Screening for deficiency anemia Monitor B12  Orders Placed This Encounter  Procedures   CBC with Differential/Platelet   COMPLETE METABOLIC PANEL WITH GFR   Magnesium   Lipid panel   TSH   Hemoglobin A1c   Insulin, random   VITAMIN D 25 Hydroxy (Vit-D Deficiency, Fractures)   Urinalysis, Routine w reflex microscopic   Microalbumin / creatinine urine ratio   Vitamin B12   EKG 12-Lead   Notify office for further evaluation and treatment, questions or concerns if any reported s/s fail to improve.   The patient was advised to call back or seek an in-person evaluation if any symptoms worsen or if the condition fails to improve as anticipated.   Further disposition pending results of labs. Discussed med's effects and SE's.    I discussed the assessment and treatment plan with the patient. The patient was provided an opportunity to ask questions and all were answered. The patient agreed with the plan and demonstrated an understanding of the instructions.  Discussed med's effects and SE's. Screening labs and tests as requested with regular follow-up as recommended.  I provided 30 minutes of face-to-face time during this encounter including counseling, chart review, and critical decision making was preformed.   Future Appointments  Date Time Provider Department Center  02/25/2024  9:00 AM Adela Glimpse, NP [facility] None     HPI  52 y.o. female  presents for a complete physical and follow up for has Asthma; IBS (irritable bowel syndrome); Hyperlipidemia; Renal stone; Gall bladder disease; History of prediabetes; Medication management; Vitamin D deficiency; Overweight (BMI  25.0-29.9); GERD (gastroesophageal reflux disease); History of right lateral epicondylitis; Statin myopathy; Chronic pain of both knees; and B12 deficiency on  their problem list.   She is a Teacher, early years/pre at [employer], married, 2 kids, eldest daughter is 40 and youngest son is 31.  Overall she reports feeling well today.    Has bil knee pain, R>L.  Has seen [name] and had steroid injections which have helped but don't last. Also takes meloxicam.  She is containing to have increase in pain.   She reports that she does occasionally get cystic acne lesions. She is also prescribed sulfa/sulfer face wash and spironolactone 50 mg and reports significant improvement. Followed by derm and gets annual total body skin check.    She has IBS-C with symptoms were stabilized on linzess 72 mcg. However, after having colonoscopy she changed her diet an is no longer taking Lizness.    Takes pepcid BID for GERD, however does report intermittent AM and post meal voice changes.   She has asthma well controlled without recent flares on breo and albuterol PRN  She does have a first degree relative with breast cancer.  Follows with [facility] (GYN) Dr. Henderson Cloud for PAPs and mammograms. Has apt to be scheduled 03/2022.  BMI is Body mass index is 26.47 kg/m., she has been working on diet and exercise though limited due to knee pain (minimial running).  She is on feet all day and active at work.  Wt Readings from Last 3 Encounters:  02/22/23 149 lb 6.4 oz (67.8 kg)  02/05/23 148 lb 12.8 oz (67.5 kg)  11/29/22 143 lb (64.9 kg)   Her blood pressure has been controlled at home, today their BP is BP: 110/80 She does not workout. She denies chest pain, shortness of breath, dizziness.   She is on cholesterol medication (gemfibrozil, zetia, omega 3) and denies myalgias. Her cholesterol is not at goal. The cholesterol last visit was:   Lab Results  Component Value Date   CHOL 189 02/22/2023   HDL 64  02/22/2023   LDLCALC 108 (H) 02/22/2023   TRIG 78 02/22/2023   CHOLHDL 3.0 02/22/2023   She has been working on diet and exercise for glucose management, and denies increased appetite, nausea, paresthesia of the feet, polydipsia, polyuria, visual disturbances and vomiting. Last A1C in the office was:  Lab Results  Component Value Date   HGBA1C 5.6 02/22/2023   Last GFR: Lab Results  Component Value Date   [facility] 104 02/11/2020   Patient is on Vitamin D supplement, taking ? 5000 IU daily, takes 2000 IU during the summer Lab Results  Component Value Date   VD25OH 54 02/22/2023   She takes B12 cap supplement 3 days a week;  Lab Results  Component Value Date   VITAMINB12 975 02/22/2023     Current Medications:  Current Outpatient Medications on File Prior to Visit  Medication Sig Dispense Refill   albuterol (VENTOLIN HFA) 108 (90 Base) MCG/ACT inhaler INHALE 2 PUFFS BY MOUTH 15 MINUTES APART EVERY 4 HOURS AS NEEDED FOR  ASTHMA 9 g 0   aspirin 81 MG tablet Take 81 mg by mouth See admin instructions. Takes 1 tablet on mondays, wednesdays, and fridays     bisoprolol (ZEBETA) 5 MG tablet Take 1 tablet by  Daily for BP 90 tablet 3   cholecalciferol (VITAMIN D) 1000 UNITS tablet Take 5,000 Units by mouth daily.      Cyanocobalamin (B-12) 1000 MCG TABS Take by mouth daily.     doxycycline (VIBRAMYCIN) 50 MG capsule Take 50 mg by mouth 2 (two) times daily as needed.  ezetimibe (ZETIA) 10 MG tablet Take  1 tablet  Daily for Cholesterol                                              /                                       TAKE                                       BY                                             MOUTH                                                     ONCE DAILY 90 tablet 3   famotidine (PEPCID) 20 MG tablet Take 20 mg by mouth 2 (two) times daily.      fluconazole (DIFLUCAN) 150 MG tablet Take 1 tablet (150 mg) at the sign of symptoms.  If not resolved after 72 hours may  take additional 150 mg dosage. 8 tablet 0   fluticasone furoate-vilanterol (BREO ELLIPTA) 200-25 MCG/ACT AEPB INHALE 1 PUFF INTO LUNGS ONCE DAILY 180 each 1   gemfibrozil (LOPID) 600 MG tablet Take  1 tablet   2 x /day  with Meals  for Triglycerides (Blood Fats)                                  /                                                                   TAKE                                         BY                                                 MOUTH 180 tablet 0   guaiFENesin (MUCINEX) 600 MG 12 hr tablet Take 600 mg by mouth as needed.      hydrocortisone (PROCTOSOL HC) 2.5 % rectal cream Place 1 Application rectally as needed for hemorrhoids or anal itching. Apply 2 to 4 x /day as needed for Rectal Pruritis 30 g 3   Ivermectin (SOOLANTRA) 1 % CREA Apply topically daily.     Magnesium 400 MG CAPS  Take 400 mg by mouth daily.     Omega-3 Fatty Acids (FISH OIL) 1000 MG CAPS Take 1 capsule by mouth daily.     triamcinolone (NASACORT) 55 MCG/ACT AERO nasal inhaler Nasacort     vitamin E 400 UNIT capsule Take 400 Units by mouth daily.      azithromycin (ZITHROMAX) 250 MG tablet Take 2 tablets on  Day 1,  followed by 1 tablet  daily for 4 more days    for Sinusitis  /Bronchitis 6 each 1   No current facility-administered medications on file prior to visit.   Allergies:  Allergies  Allergen Reactions   Food     strawberries   Pravastatin     Severe myalgias    Tramadol Itching   Medical History:  She has Asthma; IBS (irritable bowel syndrome); Hyperlipidemia; Renal stone; Gall bladder disease; History of prediabetes; Medication management; Vitamin D deficiency; Overweight (BMI 25.0-29.9); GERD (gastroesophageal reflux disease); History of right lateral epicondylitis; Statin myopathy; Chronic pain of both knees; and B12 deficiency on their problem list. Health Maintenance:   Immunization History  Administered Date(s) Administered   Hepatitis B 09/08/2015, 10/08/2015, 03/08/2016    Influenza Inj Mdck Quad Pf 12/16/2020   Influenza Split 12/05/2016, 11/29/2017, 12/07/2018   Influenza, Quadrivalent, Recombinant, Inj, Pf 12/13/2021   Influenza,inj,quad, With Preservative 01/03/2020   Influenza,trivalent, recombinat, inj, PF 12/17/2022   Influenza-Unspecified 12/26/2013, 11/26/2014, 12/21/2015   PFIZER(Purple Top)SARS-COV-2 Vaccination 11/08/2019, 11/29/2019   PPD Test 11/25/2013, 12/28/2016   Tdap 09/17/2012, 10/15/2022   Zoster Recombinant(Shingrix) 11/16/2022, 01/28/2023    Tetanus: 2014 Flu vaccine: 12/2022 from work Shingrix: 12/2022 Covid 19: pfizer, 2/2, 2021   Pap: 02/13/2019, [facility]y [facility], goes annually for pelvic  Has upcoming scheduled 04/2021 - req report MGM: 03/2020 at GYN, has scheduled 04/2021 -- req report  Colonoscopy: 07/2021 Recall 5 years 07/2026 EGD:-   Last Dental Exam: [facility], last 10/2022, goes q80m Last Eye Exam: Dr. Thereasa Solo, 2024, goes annually, needs to reschedule for this year  Last Derm: Dr. Ginger Organ, 04/2022  Patient Care Team: Lucky Cowboy, MD as PCP - General (Internal Medicine) Pyrtle, Carie Caddy, MD as Referring Physician (Gastroenterology) Jetta Lout, NP (Inactive) as Nurse Practitioner (Urology) Harold Hedge, MD as Consulting Physician (Obstetrics and Gynecology) Cherlyn Roberts, MD as Consulting Physician (Dermatology) Merwyn Katos, DPM as Consulting Physician (Podiatry)  Surgical History:  She has a past surgical history that includes Ectopic pregnancy surgery (2005); Partial hysterectomy (2009); Esophagogastroduodenoscopy (06/13/2011); and Cholecystectomy (07/27/2011). Family History:  Herfamily history includes Breast cancer (age of onset: 80) in her mother; Diabetes type II in her father; Heart Problems in her mother; Hyperlipidemia in her mother and sister; Lymphoma in her mother; Migraines in her mother and sister; Ovarian cancer in her paternal  grandmother; Stroke in her maternal grandmother; Valvular heart disease in her mother. Social History:  She reports that she has never smoked. She has never used smokeless tobacco. She reports that she does not currently use alcohol. She reports that she does not use drugs.  Review of Systems: Review of Systems  Constitutional:  Negative for malaise/fatigue and weight loss.  HENT:  Negative for hearing loss and tinnitus.        AM throat clearing and frogginess  Eyes:  Negative for blurred vision and double vision.  Respiratory:  Negative for cough, shortness of breath and wheezing.   Cardiovascular:  Negative for chest pain, palpitations, orthopnea, claudication and leg swelling.  Gastrointestinal:  Negative for abdominal pain, blood in stool, constipation, diarrhea, heartburn, melena, nausea and vomiting.  Genitourinary: Negative.   Musculoskeletal:  Positive for joint pain (bil knees - seeing ortho). Negative for myalgias.  Skin:  Negative for rash.  Neurological:  Negative for dizziness, tingling, sensory change, weakness and headaches.  Endo/Heme/Allergies:  Negative for polydipsia.  Psychiatric/Behavioral: Negative.    All other systems reviewed and are negative.   Physical Exam: Estimated body mass index is 26.47 kg/m as calculated from the following:   Height as of this encounter: 5\' 3"  (1.6 m).   Weight as of this encounter: 149 lb 6.4 oz (67.8 kg). BP 110/80   Pulse 74   Temp 98 F (36.7 C)   Ht 5\' 3"  (1.6 m)   Wt 149 lb 6.4 oz (67.8 kg)   SpO2 99%   BMI 26.47 kg/m  General Appearance: Well nourished, in no apparent distress.  Eyes: PERRLA, EOMs, conjunctiva no swelling or erythema Sinuses: No Frontal/maxillary tenderness  ENT/Mouth: Ext aud canals clear, normal light reflex with TMs without erythema, bulging. Good dentition. No erythema, swelling, or exudate on post pharynx. Tonsils not swollen or erythematous. Hearing normal.  Neck: Supple, thyroid normal. No  bruits  Respiratory: Respiratory effort normal, BS equal bilaterally without rales, rhonchi, wheezing or stridor.  Cardio: RRR without murmurs, rubs or gallops. Brisk peripheral pulses without edema.  Chest: symmetric, with normal excursions and percussion.  Breasts: Defer to GYN Abdomen: Soft, nontender, no guarding, rebound, hernias, masses, or organomegaly.  Lymphatics: Non tender without lymphadenopathy.  Genitourinary: Defer to GYN Musculoskeletal: Full ROM all peripheral extremities,5/5 strength, and normal gait.  Skin: Warm, dry without rashes, lesions, ecchymosis. Neuro: Cranial nerves intact, reflexes equal bilaterally. Normal muscle tone, no cerebellar symptoms. Sensation intact.  Psych: Awake and oriented X 3, normal affect, Insight and Judgment appropriate.   EKG:  NSR HR 61 BPM, RSR' V1, V2, IRBBB  [name] 3:55 PM [facility]

## 2023-02-23 LAB — URINALYSIS, ROUTINE W REFLEX MICROSCOPIC
Bilirubin Urine: NEGATIVE
Glucose, UA: NEGATIVE
Hgb urine dipstick: NEGATIVE
Ketones, ur: NEGATIVE
Leukocytes,Ua: NEGATIVE
Nitrite: NEGATIVE
Protein, ur: NEGATIVE
Specific Gravity, Urine: 1.003 (ref 1.001–1.035)
pH: 6.5 (ref 5.0–8.0)

## 2023-02-23 LAB — CBC WITH DIFFERENTIAL/PLATELET
Absolute Lymphocytes: 1738 {cells}/uL (ref 850–3900)
Absolute Monocytes: 473 {cells}/uL (ref 200–950)
Basophils Absolute: 27 {cells}/uL (ref 0–200)
Basophils Relative: 0.3 %
Eosinophils Absolute: 64 {cells}/uL (ref 15–500)
Eosinophils Relative: 0.7 %
HCT: 43.1 % (ref 35.0–45.0)
Hemoglobin: 14.5 g/dL (ref 11.7–15.5)
MCH: 28.5 pg (ref 27.0–33.0)
MCHC: 33.6 g/dL (ref 32.0–36.0)
MCV: 84.8 fL (ref 80.0–100.0)
MPV: 10.6 fL (ref 7.5–12.5)
Monocytes Relative: 5.2 %
Neutro Abs: 6798 {cells}/uL (ref 1500–7800)
Neutrophils Relative %: 74.7 %
Platelets: 283 10*3/uL (ref 140–400)
RBC: 5.08 10*6/uL (ref 3.80–5.10)
RDW: 12.7 % (ref 11.0–15.0)
Total Lymphocyte: 19.1 %
WBC: 9.1 10*3/uL (ref 3.8–10.8)

## 2023-02-23 LAB — COMPLETE METABOLIC PANEL WITH GFR
AG Ratio: 2 (calc) (ref 1.0–2.5)
ALT: 13 U/L (ref 6–29)
AST: 13 U/L (ref 10–35)
Albumin: 4.5 g/dL (ref 3.6–5.1)
Alkaline phosphatase (APISO): 74 U/L (ref 37–153)
BUN: 17 mg/dL (ref 7–25)
CO2: 24 mmol/L (ref 20–32)
Calcium: 9.1 mg/dL (ref 8.6–10.4)
Chloride: 107 mmol/L (ref 98–110)
Creat: 0.67 mg/dL (ref 0.50–1.03)
Globulin: 2.2 g/dL (ref 1.9–3.7)
Glucose, Bld: 79 mg/dL (ref 65–99)
Potassium: 4.2 mmol/L (ref 3.5–5.3)
Sodium: 137 mmol/L (ref 135–146)
Total Bilirubin: 0.9 mg/dL (ref 0.2–1.2)
Total Protein: 6.7 g/dL (ref 6.1–8.1)
eGFR: 105 mL/min/{1.73_m2} (ref >=60)

## 2023-02-23 LAB — INSULIN, RANDOM: Insulin: 6.3 u[IU]/mL

## 2023-02-23 LAB — MAGNESIUM: Magnesium: 2.3 mg/dL (ref 1.5–2.5)

## 2023-02-23 LAB — HEMOGLOBIN A1C
Hgb A1c MFr Bld: 5.6 %{Hb} (ref <5.7)
Mean Plasma Glucose: 114 mg/dL
eAG (mmol/L): 6.3 mmol/L

## 2023-02-23 LAB — TSH: TSH: 0.74 m[IU]/L

## 2023-02-23 LAB — LIPID PANEL
Cholesterol: 189 mg/dL (ref <200)
HDL: 64 mg/dL (ref >=50)
LDL Cholesterol (Calc): 108 mg/dL — ABNORMAL HIGH
Non-HDL Cholesterol (Calc): 125 mg/dL (ref <130)
Total CHOL/HDL Ratio: 3 (calc) (ref <5.0)
Triglycerides: 78 mg/dL (ref <150)

## 2023-02-23 LAB — VITAMIN D 25 HYDROXY (VIT D DEFICIENCY, FRACTURES): Vit D, 25-Hydroxy: 54 ng/mL (ref 30–100)

## 2023-02-23 LAB — MICROALBUMIN / CREATININE URINE RATIO
Creatinine, Urine: 12 mg/dL — ABNORMAL LOW (ref 20–275)
Microalb, Ur: <0.2 mg/dL

## 2023-02-23 LAB — VITAMIN B12: Vitamin B-12: 975 pg/mL (ref 200–1100)

## 2023-02-23 NOTE — Telephone Encounter (Signed)
Pharmacy Patient Advocate Encounter  Received notification from [facility] that Prior Authorization for TRULANCE 3MG   has been APPROVED from 01/05/23 to 07/06/23 . However, in further review TRULANCE was cancelled by provider on 12.11.23   PA #/Case ID/Reference #: [id number]

## 2023-02-27 ENCOUNTER — Other Ambulatory Visit: Payer: Self-pay | Admitting: Internal Medicine

## 2023-02-27 DIAGNOSIS — G8929 Other chronic pain: Secondary | ICD-10-CM

## 2023-02-27 MED ORDER — MELOXICAM 15 MG PO TABS: ORAL_TABLET | ORAL | 3 refills | Status: DC

## 2023-03-01 ENCOUNTER — Encounter: Payer: Self-pay | Admitting: Nurse Practitioner

## 2023-03-01 NOTE — Patient Instructions (Signed)

## 2023-04-12 ENCOUNTER — Other Ambulatory Visit: Payer: Self-pay

## 2023-04-30 ENCOUNTER — Ambulatory Visit: Payer: BC Managed Care – PPO | Admitting: Family Medicine

## 2023-04-30 VITALS — BP 122/88 | HR 68 | Temp 98.2°F | Ht 63.0 in | Wt 154.0 lb

## 2023-04-30 DIAGNOSIS — Z79899 Other long term (current) drug therapy: Secondary | ICD-10-CM | POA: Diagnosis not present

## 2023-04-30 DIAGNOSIS — G720 Drug-induced myopathy: Secondary | ICD-10-CM | POA: Diagnosis not present

## 2023-04-30 DIAGNOSIS — E782 Mixed hyperlipidemia: Secondary | ICD-10-CM

## 2023-04-30 DIAGNOSIS — T466X5A Adverse effect of antihyperlipidemic and antiarteriosclerotic drugs, initial encounter: Secondary | ICD-10-CM | POA: Diagnosis not present

## 2023-04-30 MED ORDER — GEMFIBROZIL 600 MG PO TABS: ORAL_TABLET | ORAL | 0 refills | Status: DC

## 2023-04-30 NOTE — Progress Notes (Signed)
 New Patient Office Visit  Subjective    Patient ID: [name], female    DOB: 19-Aug-1970  Age: 53 y.o. MRN: [id number]  CC:  Chief Complaint  Patient presents with   Establish Care    HPI [name] presents to establish care today. Requesting refill of gemfibrozil. Sees Dr. Henderson Cloud at [facility]. Records requested today. Recently had labs in December 2024.  She is up to date on routine screenings.  She is up to date on routine vaccines.  Reports that her mother recently passed away. She is a Teacher, early years/pre. Denies other concerns.  Outpatient Encounter Medications as of 04/30/2023  Medication Sig   albuterol (VENTOLIN HFA) 108 (90 Base) MCG/ACT inhaler INHALE 2 PUFFS BY MOUTH 15 MINUTES APART EVERY 4 HOURS AS NEEDED FOR  ASTHMA   aspirin 81 MG tablet Take 81 mg by mouth See admin instructions. Takes 1 tablet on mondays, wednesdays, and fridays   bisoprolol (ZEBETA) 5 MG tablet Take 1 tablet by  Daily for BP   cholecalciferol (VITAMIN D) 1000 UNITS tablet Take 5,000 Units by mouth daily.    Cyanocobalamin (B-12) 1000 MCG TABS Take by mouth daily.   doxycycline (VIBRAMYCIN) 50 MG capsule Take 50 mg by mouth 2 (two) times daily as needed.   ezetimibe (ZETIA) 10 MG tablet Take  1 tablet  Daily for Cholesterol                                              /                                       TAKE                                       BY                                             MOUTH                                                     ONCE DAILY   famotidine (PEPCID) 20 MG tablet Take 20 mg by mouth 2 (two) times daily.    fluticasone furoate-vilanterol (BREO ELLIPTA) 200-25 MCG/ACT AEPB INHALE 1 PUFF INTO LUNGS ONCE DAILY   guaiFENesin (MUCINEX) 600 MG 12 hr tablet Take 600 mg by mouth as needed.    hydrocortisone (PROCTOSOL HC) 2.5 % rectal cream Place 1 Application rectally as needed for hemorrhoids or anal itching. Apply 2 to 4 x /day as needed for Rectal Pruritis    Ivermectin (SOOLANTRA) 1 % CREA Apply topically daily.   Magnesium 400 MG CAPS Take 400 mg by mouth daily.   meloxicam (MOBIC) 15 MG tablet Take  1/2 to 1 tablet  Daily  with Food  for Pain & Inflammation & try limit to 4-5 tabs /week to avoid Kidney Damage                                                                                         /  TAKE                                 BY                              MOUTH   Omega-3 Fatty Acids (FISH OIL) 1000 MG CAPS Take 1 capsule by mouth daily.   triamcinolone (NASACORT) 55 MCG/ACT AERO nasal inhaler Nasacort   vitamin E 400 UNIT capsule Take 400 Units by mouth daily.    [DISCONTINUED] gemfibrozil (LOPID) 600 MG tablet Take  1 tablet   2 x /day  with Meals  for Triglycerides (Blood Fats)                                  /                                                                   TAKE                                         BY                                                 MOUTH   gemfibrozil (LOPID) 600 MG tablet Take  1 tablet   2 x /day  with Meals  for Triglycerides (Blood Fats)                                  /                                                                   TAKE                                         BY                                                 MOUTH   [DISCONTINUED] fluconazole (DIFLUCAN) 150 MG tablet Take 1 tablet (150 mg) at the sign of symptoms.  If not resolved after 72 hours may take additional 150 mg dosage. (Patient not taking: Reported on 04/30/2023)   No facility-administered encounter medications on file as of 04/30/2023.    Past Medical History:  Diagnosis Date   Acne    Allergy  Arthritis    Asthma    Chronic kidney disease    Difficulty sleeping    due to discomfort   Elevated cholesterol    GERD (gastroesophageal reflux disease)    HLD (hyperlipidemia)    IBS (irritable bowel syndrome)    Nausea    Statin myopathy 08/18/2019     Past Surgical History:  Procedure Laterality Date   CHOLECYSTECTOMY  07/27/2011   Procedure: LAPAROSCOPIC CHOLECYSTECTOMY WITH INTRAOPERATIVE CHOLANGIOGRAM;  Surgeon: Kandis Cocking, MD;  Location: [facility];  Service: General;  Laterality: [name];   ECTOPIC PREGNANCY SURGERY  2005   ESOPHAGOGASTRODUODENOSCOPY  06/13/2011   Procedure: ESOPHAGOGASTRODUODENOSCOPY (EGD);  Surgeon: Beverley Fiedler, MD;  Location: [facility];  Service: Gastroenterology;  Laterality: [name];   PARTIAL HYSTERECTOMY  2009    Family History  Problem Relation Age of Onset   Breast cancer Mother 73   Lymphoma Mother    Migraines Mother    Hyperlipidemia Mother    Heart Problems Mother        Aortic value failure - stenosis    Valvular heart disease Mother    Diabetes type II Father    Diabetes Father    Migraines Sister    Hyperlipidemia Sister    Stroke Maternal Grandmother    Ovarian cancer Paternal Grandmother     Social History   Socioeconomic History   Marital status: Married    Spouse name: Not on file   Number of children: 2   Years of education: Not on file   Highest education level: Bachelor's degree (e.g., BA, AB, BS)  Occupational History   Occupation: Printmaker: [employer]  Tobacco Use   Smoking status: Never   Smokeless tobacco: Never  Vaping Use   Vaping status: Never Used  Substance and Sexual Activity   Alcohol use: Not Currently    Comment: Rare   Drug use: No   Sexual activity: Yes    Partners: Male    Birth control/protection: Surgical  Other Topics Concern   Not on file  Social History Narrative   Not on file   Social Drivers of Health   Financial Resource Strain: Low Risk  (04/30/2023)   Overall Financial Resource Strain (CARDIA)    Difficulty of Paying Living Expenses: Not very hard  Food Insecurity: No Food Insecurity (04/30/2023)   Hunger Vital Sign    Worried About Running Out of Food in the Last Year: Never true    Ran Out of Food in the Last Year: Never  true  Transportation Needs: No Transportation Needs (04/30/2023)   PRAPARE - Administrator, Civil Service (Medical): No    Lack of Transportation (Non-Medical): No  Physical Activity: Insufficiently Active (04/30/2023)   Exercise Vital Sign    Days of Exercise per Week: 2 days    Minutes of Exercise per Session: 30 min  Stress: No Stress Concern Present (04/30/2023)   [employer] of Occupational Health - Occupational Stress Questionnaire    Feeling of Stress : Only a little  Social Connections: Socially Integrated (04/30/2023)   Social Connection and Isolation Panel [NHANES]    Frequency of Communication with Friends and Family: More than three times a week    Frequency of Social Gatherings with Friends and Family: Twice a week    Attends Religious Services: More than 4 times per year    Active Member of [employer] or Organizations: Yes    Attends Banker Meetings: More than 4  times per year    Marital Status: Married  Catering manager Violence: Not on file    ROS Per HPI      Objective    BP 122/88   Pulse 68   Temp 98.2 F (36.8 C) (Oral)   Ht 5\' 3"  (1.6 m)   Wt 154 lb (69.9 kg)   SpO2 96%   BMI 27.28 kg/m   Physical Exam Vitals and nursing note reviewed.  Constitutional:      General: She is not in acute distress.    Appearance: Normal appearance. She is normal weight.  HENT:     Head: Normocephalic and atraumatic.     Nose: Nose normal.  Eyes:     Extraocular Movements: Extraocular movements intact.     Pupils: Pupils are equal, round, and reactive to light.  Cardiovascular:     Rate and Rhythm: Normal rate and regular rhythm.     Heart sounds: Normal heart sounds. No murmur heard. Pulmonary:     Effort: Pulmonary effort is normal. No respiratory distress.     Breath sounds: Normal breath sounds. No wheezing, rhonchi or rales.  Musculoskeletal:        General: Normal range of motion.     Cervical back: Normal range of motion.   Lymphadenopathy:     Cervical: No cervical adenopathy.  Neurological:     General: No focal deficit present.     Mental Status: She is alert and oriented to person, place, and time.  Psychiatric:        Mood and Affect: Mood normal.        Thought Content: Thought content normal.         Assessment & Plan:   Hyperlipidemia, mixed -     Gemfibrozil; Take  1 tablet   2 x /day  with Meals  for Triglycerides (Blood Fats)                                  /                                                                   TAKE                                         BY                                                 MOUTH  Dispense: 180 tablet; Refill: 0  Statin myopathy  Medication management   Continue current medication regimen   Return in about 10 months (around 02/27/2024) for physical.   Moshe Cipro, FNP

## 2023-04-30 NOTE — Patient Instructions (Addendum)
 Welcome to [facility]!  Refilled gemfibrozil today.  Follow up with me in a year for your next physical.

## 2023-05-25 ENCOUNTER — Other Ambulatory Visit: Payer: Self-pay

## 2023-05-25 DIAGNOSIS — E782 Mixed hyperlipidemia: Secondary | ICD-10-CM

## 2023-05-25 MED ORDER — EZETIMIBE 10 MG PO TABS: ORAL_TABLET | ORAL | 0 refills | Status: DC

## 2023-05-30 ENCOUNTER — Other Ambulatory Visit: Payer: Self-pay | Admitting: Family Medicine

## 2023-05-30 MED ORDER — BISOPROLOL FUMARATE 5 MG PO TABS: ORAL_TABLET | ORAL | 3 refills | Status: DC

## 2023-05-30 NOTE — Telephone Encounter (Signed)
 Copied from CRM [id number]. Topic: Clinical - Medication Refill >> May 30, 2023 11:23 AM Elizebeth Brooking wrote: Most Recent Primary Care Visit:  Provider: Moshe Cipro  Department: [facility]  Visit Type: NEW PT - OFFICE VISIT  Date: 04/30/2023  Medication: bisoprolol (ZEBETA) 5 MG tablet   Has the patient contacted their pharmacy? Yes (Agent: If no, request that the patient contact the pharmacy for the refill. If patient does not wish to contact the pharmacy document the reason why and proceed with request.) (Agent: If yes, when and what did the pharmacy advise?)  Is this the correct pharmacy for this prescription? Yes If no, delete pharmacy and type the correct one.  This is the patient's preferred pharmacy:  [facility] [address], [place] - [address] [facility] [place] [address] Phone: [phone] Fax: [phone]   Has the prescription been filled recently? No  Is the patient out of the medication? Yes  Has the patient been seen for an appointment in the last year OR does the patient have an upcoming appointment? Yes  Can we respond through MyChart? Yes  Agent: Please be advised that Rx refills may take up to 3 business days. We ask that you follow-up with your pharmacy.

## 2023-06-11 ENCOUNTER — Other Ambulatory Visit (HOSPITAL_COMMUNITY): Payer: Self-pay

## 2023-06-29 ENCOUNTER — Other Ambulatory Visit: Payer: Self-pay | Admitting: Obstetrics and Gynecology

## 2023-06-29 DIAGNOSIS — R928 Other abnormal and inconclusive findings on diagnostic imaging of breast: Secondary | ICD-10-CM

## 2023-07-14 ENCOUNTER — Encounter: Payer: Self-pay | Admitting: Family Medicine

## 2023-07-16 ENCOUNTER — Other Ambulatory Visit: Payer: Self-pay

## 2023-07-16 DIAGNOSIS — J4541 Moderate persistent asthma with (acute) exacerbation: Secondary | ICD-10-CM

## 2023-07-16 MED ORDER — ALBUTEROL SULFATE HFA 108 (90 BASE) MCG/ACT IN AERS: INHALATION_SPRAY | RESPIRATORY_TRACT | 1 refills | Status: AC

## 2023-07-17 ENCOUNTER — Ambulatory Visit
Admission: RE | Admit: 2023-07-17 | Discharge: 2023-07-17 | Disposition: A | Discharge status: Home / Self Care | Source: Ambulatory Visit | Attending: Obstetrics and Gynecology | Admitting: Obstetrics and Gynecology

## 2023-07-17 DIAGNOSIS — R928 Other abnormal and inconclusive findings on diagnostic imaging of breast: Secondary | ICD-10-CM

## 2023-08-20 ENCOUNTER — Telehealth: Payer: Self-pay | Admitting: Family

## 2023-08-20 DIAGNOSIS — E782 Mixed hyperlipidemia: Secondary | ICD-10-CM

## 2023-08-21 ENCOUNTER — Other Ambulatory Visit: Payer: Self-pay

## 2023-08-21 DIAGNOSIS — G8929 Other chronic pain: Secondary | ICD-10-CM

## 2023-08-21 DIAGNOSIS — E782 Mixed hyperlipidemia: Secondary | ICD-10-CM

## 2023-08-21 MED ORDER — MELOXICAM 15 MG PO TABS: ORAL_TABLET | ORAL | 0 refills | Status: DC

## 2023-08-21 MED ORDER — EZETIMIBE 10 MG PO TABS: ORAL_TABLET | ORAL | 1 refills | Status: DC

## 2023-08-21 MED ORDER — FLUTICASONE FUROATE-VILANTEROL 200-25 MCG/ACT IN AEPB: 1.0000 | INHALATION_SPRAY | Freq: Every day | RESPIRATORY_TRACT | 1 refills | Status: DC

## 2023-08-21 NOTE — Telephone Encounter (Signed)
 Spoke with patient, sent in all requested refills

## 2023-08-21 NOTE — Telephone Encounter (Signed)
 Copied from CRM [id number]. Topic: Clinical - Medication Question >> Aug 21, 2023 12:51 PM Leah C wrote: Reason for CRM: Patient called in to check on medication refill for Ezetimibe  (10 mg Oral Daily, for cholesterol.). I advised the medication was showing a pending status. Patient stated she would check in with pharmacy again but they stated to not have received again.

## 2023-10-25 ENCOUNTER — Other Ambulatory Visit: Payer: Self-pay | Admitting: Family Medicine

## 2023-10-25 DIAGNOSIS — E782 Mixed hyperlipidemia: Secondary | ICD-10-CM

## 2023-11-05 ENCOUNTER — Ambulatory Visit: Admitting: Family Medicine

## 2023-11-05 VITALS — BP 118/82 | HR 61 | Temp 98.5°F | Ht 63.0 in | Wt 162.0 lb

## 2023-11-05 DIAGNOSIS — R5383 Other fatigue: Secondary | ICD-10-CM

## 2023-11-05 DIAGNOSIS — N951 Menopausal and female climacteric states: Secondary | ICD-10-CM | POA: Diagnosis not present

## 2023-11-05 DIAGNOSIS — J4530 Mild persistent asthma, uncomplicated: Secondary | ICD-10-CM

## 2023-11-05 DIAGNOSIS — R635 Abnormal weight gain: Secondary | ICD-10-CM | POA: Diagnosis not present

## 2023-11-05 LAB — COMPREHENSIVE METABOLIC PANEL WITH GFR
ALT: 18 U/L (ref 0–35)
AST: 15 U/L (ref 0–37)
Albumin: 4.4 g/dL (ref 3.5–5.2)
Alkaline Phosphatase: 61 U/L (ref 39–117)
BUN: 13 mg/dL (ref 6–23)
CO2: 27 meq/L (ref 19–32)
Calcium: 9 mg/dL (ref 8.4–10.5)
Chloride: 104 meq/L (ref 96–112)
Creatinine, Ser: 0.73 mg/dL (ref 0.40–1.20)
GFR: 94.35 mL/min (ref >60.00)
Glucose, Bld: 99 mg/dL (ref 70–99)
Potassium: 4.5 meq/L (ref 3.5–5.1)
Sodium: 140 meq/L (ref 135–145)
Total Bilirubin: 0.4 mg/dL (ref 0.2–1.2)
Total Protein: 6.7 g/dL (ref 6.0–8.3)

## 2023-11-05 LAB — CBC WITH DIFFERENTIAL/PLATELET
Basophils Absolute: 0 K/uL (ref 0.0–0.1)
Basophils Relative: 0.4 % (ref 0.0–3.0)
Eosinophils Absolute: 0.1 K/uL (ref 0.0–0.7)
Eosinophils Relative: 1.3 % (ref 0.0–5.0)
HCT: 44.3 % (ref 36.0–46.0)
Hemoglobin: 14.6 g/dL (ref 12.0–15.0)
Lymphocytes Relative: 21.8 % (ref 12.0–46.0)
Lymphs Abs: 1.7 K/uL (ref 0.7–4.0)
MCHC: 32.9 g/dL (ref 30.0–36.0)
MCV: 86.1 fl (ref 78.0–100.0)
Monocytes Absolute: 0.6 K/uL (ref 0.1–1.0)
Monocytes Relative: 7.7 % (ref 3.0–12.0)
Neutro Abs: 5.2 K/uL (ref 1.4–7.7)
Neutrophils Relative %: 68.8 % (ref 43.0–77.0)
Platelets: 275 K/uL (ref 150.0–400.0)
RBC: 5.15 Mil/uL — ABNORMAL HIGH (ref 3.87–5.11)
RDW: 13.6 % (ref 11.5–15.5)
WBC: 7.6 K/uL (ref 4.0–10.5)

## 2023-11-05 LAB — TSH: TSH: 0.85 u[IU]/mL (ref 0.35–5.50)

## 2023-11-05 LAB — FOLLICLE STIMULATING HORMONE: FSH: 22.4 m[IU]/mL

## 2023-11-05 LAB — LUTEINIZING HORMONE: LH: 38.68 m[IU]/mL

## 2023-11-05 NOTE — Progress Notes (Signed)
 Acute Office Visit  Subjective:     Patient ID: [name], female    DOB: 22-Dec-1970, 53 y.o.   MRN: [id number]  Chief Complaint  Patient presents with   Weight Check   Menopause    Ongoing for about the last year    HPI  Discussed the use of AI scribe software for clinical note transcription with the patient, who gave verbal consent to proceed.  History of Present Illness [name] is a 53 year old female who presents with mood changes, weight gain, and fatigue possibly related to menopause.  Menopausal symptoms - Significant mood changes, including increased irritability and emotional lability, since her mother's passing - Increased sleep needs and persistent fatigue - Decreased libido - Hot flashes with varying intensity and frequency - Weight gain of approximately 15 pounds since February despite unchanged eating habits - No recent laboratory evaluation to confirm menopausal status; previous labs in 2023 indicated she was not in menopause - History of partial hysterectomy with ovarian conservation - Concern regarding hormone replacement therapy due to maternal history of breast cancer  Gastrointestinal symptoms - Magnesium supplementation provides symptomatic relief but causes gastrointestinal upset when overused  Respiratory symptoms - Asthma managed with daily Breo - Occasional labored breathing on hot, humid days  Musculoskeletal symptoms - Discontinued running due to knee issues and financial constraints     ROS Per HPI      Objective:    BP 118/82 (BP Location: Left Arm, Patient Position: Sitting)   Pulse 61   Temp 98.5 F (36.9 C) (Temporal)   Ht 5' 3 (1.6 m)   Wt 162 lb (73.5 kg)   SpO2 98%   BMI 28.70 kg/m    Physical Exam Vitals and nursing note reviewed.  Constitutional:      General: She is not in acute distress.    Appearance: Normal appearance. She is normal weight.  HENT:     Head: Normocephalic and atraumatic.     Right  Ear: External ear normal.     Left Ear: External ear normal.     Nose: Nose normal.     Mouth/Throat:     Mouth: Mucous membranes are moist.     Pharynx: Oropharynx is clear.  Eyes:     Extraocular Movements: Extraocular movements intact.     Pupils: Pupils are equal, round, and reactive to light.  Cardiovascular:     Rate and Rhythm: Normal rate and regular rhythm.     Pulses: Normal pulses.     Heart sounds: Normal heart sounds.  Pulmonary:     Effort: Pulmonary effort is normal. No respiratory distress.     Breath sounds: Normal breath sounds. No wheezing, rhonchi or rales.  Musculoskeletal:        General: Normal range of motion.     Cervical back: Normal range of motion.     Right lower leg: No edema.     Left lower leg: No edema.  Lymphadenopathy:     Cervical: No cervical adenopathy.  Neurological:     General: No focal deficit present.     Mental Status: She is alert and oriented to person, place, and time.  Psychiatric:        Mood and Affect: Mood normal.        Thought Content: Thought content normal.     No results found for any visits on 11/05/23.      Assessment & Plan:   Assessment and Plan Assessment &  Plan Menopausal symptoms (hot flashes, mood changes, decreased libido) Intermittent menopausal symptoms with concerns about hormone therapy due to family history of breast cancer. Prefers non-hormonal management. - Order lab tests: estrogen, progesterone , testosterone, LH, FSH, thyroid  levels. - Discuss progesterone -only therapy if menopausal status confirmed and symptoms are bothersome. - Consider non-hormonal options if symptoms are mild.  Abnormal weight gain and fatigue Weight gain and fatigue possibly due to hormonal changes, decreased activity, and stress. No interest in pharmacological treatment for mood disorders. - Order CBC and CMP to assess metabolic or systemic issues. - Evaluate thyroid  function.  Asthma Uses Breo Ellipta  daily.  Occasional shortness of breath in hot, humid conditions. - Continue Breo Ellipta  daily.      Orders Placed This Encounter  Procedures   CBC with Differential/Platelet    Release to patient:   Immediate [1]   Comprehensive metabolic panel with GFR    Release to patient:   Immediate [1]   TSH   FSH   LH   Estrogens , Total   Progesterone    Testosterone, Women/Child     No orders of the defined types were placed in this encounter.   Return if symptoms worsen or fail to improve, for meds.  Jennifer LITTIE Ku, FNP

## 2023-11-05 NOTE — Patient Instructions (Addendum)
 Continue current medication regimen.  We are checking labs today, will be in contact with any results that require further attention.

## 2023-11-06 ENCOUNTER — Ambulatory Visit: Payer: Self-pay | Admitting: Family Medicine

## 2023-11-09 LAB — PROGESTERONE: Progesterone: <0.5 ng/mL

## 2023-11-09 LAB — ESTROGENS, TOTAL: Estrogen: 243 pg/mL

## 2023-11-12 LAB — TESTOSTERONE, WOMEN/CHILD: Testosterone, Serum (Total): 16 ng/dL

## 2023-12-09 NOTE — Progress Notes (Signed)
 Established Patient Office Visit  Subjective:     Patient ID: [name], female    DOB: [birth year], 53 y.o.   MRN: [id number]  Chief Complaint  Patient presents with   Follow-up    HPI  53 year old female presents for medication management and inquires about weight loss. Has been using inositol and berberine supplement. Has been using all regular every other day. Labs at last visit did show postmenopause. Discussed hormone replacement therapy, open to trying today.    ROS Per HPI      Objective:    BP 120/80 (BP Location: Left Arm, Patient Position: Sitting)   Pulse (!) 58   Temp 98.4 F (36.9 C) (Temporal)   Ht 5' 3 (1.6 m)   Wt 160 lb (72.6 kg)   SpO2 99%   BMI 28.34 kg/m    Physical Exam Vitals and nursing note reviewed.  Constitutional:      General: She is not in acute distress.    Appearance: Normal appearance.  HENT:     Head: Normocephalic and atraumatic.     Right Ear: External ear normal.     Left Ear: External ear normal.     Nose: Nose normal.     Mouth/Throat:     Mouth: Mucous membranes are moist.     Pharynx: Oropharynx is clear.  Eyes:     Extraocular Movements: Extraocular movements intact.     Pupils: Pupils are equal, round, and reactive to light.  Cardiovascular:     Rate and Rhythm: Normal rate and regular rhythm.     Pulses: Normal pulses.     Heart sounds: Normal heart sounds.  Pulmonary:     Effort: Pulmonary effort is normal. No respiratory distress.     Breath sounds: Normal breath sounds. No wheezing, rhonchi or rales.  Musculoskeletal:        General: Normal range of motion.     Cervical back: Normal range of motion.     Right lower leg: No edema.     Left lower leg: No edema.  Lymphadenopathy:     Cervical: No cervical adenopathy.  Neurological:     General: No focal deficit present.     Mental Status: She is alert and oriented to person, place, and time.  Psychiatric:        Mood and Affect: Mood normal.         Thought Content: Thought content normal.     No results found for any visits on 12/10/23.  The 10-year ASCVD risk score (Arnett DK, et al., 2019) is: 1.4%  BP Readings from Last 3 Encounters:  12/10/23 120/80  11/05/23 118/82  04/30/23 122/88   Wt Readings from Last 3 Encounters:  12/10/23 160 lb (72.6 kg)  11/05/23 162 lb (73.5 kg)  04/30/23 154 lb (69.9 kg)      Last CBC Lab Results  Component Value Date   WBC 7.6 11/05/2023   HGB 14.6 11/05/2023   HCT 44.3 11/05/2023   MCV 86.1 11/05/2023   MCH 28.5 02/22/2023   RDW 13.6 11/05/2023   PLT 275.0 11/05/2023   Last metabolic panel Lab Results  Component Value Date   GLUCOSE 99 11/05/2023   NA 140 11/05/2023   K 4.5 11/05/2023   CL 104 11/05/2023   CO2 27 11/05/2023   BUN 13 11/05/2023   CREATININE 0.73 11/05/2023   GFR 94.35 11/05/2023   CALCIUM  9.0 11/05/2023   PROT 6.7 11/05/2023   ALBUMIN 4.4 11/05/2023  BILITOT 0.4 11/05/2023   ALKPHOS 61 11/05/2023   AST 15 11/05/2023   ALT 18 11/05/2023   Last lipids Lab Results  Component Value Date   CHOL 189 02/22/2023   HDL 64 02/22/2023   LDLCALC 108 (H) 02/22/2023   TRIG 78 02/22/2023   CHOLHDL 3.0 02/22/2023   Last hemoglobin A1c Lab Results  Component Value Date   HGBA1C 5.6 02/22/2023   Last thyroid  functions Lab Results  Component Value Date   TSH 0.85 11/05/2023   T3TOTAL 128 11/29/2022   Last vitamin D  Lab Results  Component Value Date   VD25OH 54 02/22/2023   Last vitamin B12 and Folate Lab Results  Component Value Date   VITAMINB12 975 02/22/2023         Assessment & Plan:   Menopausal vasomotor syndrome -     Progesterone ; Take 1 capsule of 25mg  progesterone  once daily at bedtime.  Dispense: 60 capsule; Refill: 1  BMI 28.0-28.9,adult  Continue efforts in healthy diet and activity level  Progesterone  will also likely help to stabilize weight    No orders of the defined types were placed in this encounter.     Meds ordered this encounter  Medications   progesterone  (PROMETRIUM ) 100 MG capsule    Sig: Take 1 capsule of 25mg  progesterone  once daily at bedtime.    Dispense:  60 capsule    Refill:  1    Please compound in doses of 25mg  progesterone  per capsule    Return in about 6 months (around 06/08/2024).  Corean LITTIE Ku, FNP

## 2023-12-10 ENCOUNTER — Ambulatory Visit: Admitting: Family Medicine

## 2023-12-10 ENCOUNTER — Encounter: Payer: Self-pay | Admitting: Family Medicine

## 2023-12-10 ENCOUNTER — Telehealth: Payer: Self-pay | Admitting: Family Medicine

## 2023-12-10 VITALS — BP 120/80 | HR 58 | Temp 98.4°F | Ht 63.0 in | Wt 160.0 lb

## 2023-12-10 DIAGNOSIS — N951 Menopausal and female climacteric states: Secondary | ICD-10-CM

## 2023-12-10 DIAGNOSIS — Z6828 Body mass index (BMI) 28.0-28.9, adult: Secondary | ICD-10-CM | POA: Diagnosis not present

## 2023-12-10 MED ORDER — PROGESTERONE MICRONIZED 100 MG PO CAPS: ORAL_CAPSULE | ORAL | 1 refills | Status: DC

## 2023-12-10 NOTE — Telephone Encounter (Signed)
 Copied from CRM #[id number]. Topic: Clinical - Medication Question >> Dec 10, 2023  1:45 PM Mesmerise C wrote: Reason for CRM: [facility] calling about the progesterone  (PROMETRIUM ) 100 MG capsule stated  they do it as a sustained released want to make sure it's okay

## 2023-12-10 NOTE — Patient Instructions (Addendum)
 Progesterone  25mg  once daily at bedtime.   Oil of Oregano every other day.   Follow-up with me in 6 mos for medication management, sooner if needed.

## 2023-12-11 ENCOUNTER — Encounter: Payer: Self-pay | Admitting: Family Medicine

## 2023-12-12 ENCOUNTER — Telehealth: Payer: Self-pay

## 2023-12-12 NOTE — Telephone Encounter (Signed)
 Copied from CRM #[id number]. Topic: Clinical - Medication Question >> Dec 12, 2023 12:06 PM [name] wrote: Reason for CRM: [facility] is calling for clarification on a prescription they received for progesterone  (PROMETRIUM ) 100 MG capsule [[id number]].

## 2023-12-12 NOTE — Telephone Encounter (Signed)
 Please see patient call encounter, waiting to hear back from PCP regarding pharmacy call

## 2023-12-13 NOTE — Telephone Encounter (Signed)
 Spoke with Misty at [facility], still able to compound medication. Will process script

## 2023-12-30 ENCOUNTER — Encounter: Payer: Self-pay | Admitting: Family Medicine

## 2024-01-01 ENCOUNTER — Other Ambulatory Visit: Payer: Self-pay

## 2024-01-01 DIAGNOSIS — B379 Candidiasis, unspecified: Secondary | ICD-10-CM

## 2024-01-01 MED ORDER — FLUCONAZOLE 150 MG PO TABS: ORAL_TABLET | ORAL | 0 refills | Status: AC

## 2024-01-08 ENCOUNTER — Other Ambulatory Visit: Payer: Self-pay | Admitting: Family Medicine

## 2024-01-08 DIAGNOSIS — G8929 Other chronic pain: Secondary | ICD-10-CM

## 2024-01-08 MED ORDER — MELOXICAM 15 MG PO TABS: ORAL_TABLET | ORAL | 0 refills | Status: DC

## 2024-01-08 NOTE — Telephone Encounter (Signed)
 Copied from CRM [id number]. Topic: Clinical - Medication Refill >> Jan 08, 2024  8:19 AM Brittany M wrote: Medication: meloxicam  (MOBIC ) 15 MG tablet  Has the patient contacted their pharmacy? Yes (Agent: If no, request that the patient contact the pharmacy for the refill. If patient does not wish to contact the pharmacy document the reason why and proceed with request.) (Agent: If yes, when and what did the pharmacy advise?)  This is the patient's preferred pharmacy:  [facility] [address], [place] - [address] [facility] [place] [address] Phone: [phone] Fax: [phone]   Is this the correct pharmacy for this prescription? Yes If no, delete pharmacy and type the correct one.   Has the prescription been filled recently? Yes  Is the patient out of the medication? Yes  Has the patient been seen for an appointment in the last year OR does the patient have an upcoming appointment? Yes  Can we respond through MyChart? Yes  Agent: Please be advised that Rx refills may take up to 3 business days. We ask that you follow-up with your pharmacy.

## 2024-01-14 ENCOUNTER — Other Ambulatory Visit: Payer: Self-pay | Admitting: Family Medicine

## 2024-01-14 DIAGNOSIS — E782 Mixed hyperlipidemia: Secondary | ICD-10-CM

## 2024-02-21 ENCOUNTER — Other Ambulatory Visit: Payer: Self-pay | Admitting: Family Medicine

## 2024-02-21 DIAGNOSIS — E782 Mixed hyperlipidemia: Secondary | ICD-10-CM

## 2024-02-25 ENCOUNTER — Encounter: Payer: BC Managed Care – PPO | Admitting: Nurse Practitioner

## 2024-02-27 ENCOUNTER — Encounter: Payer: BC Managed Care – PPO | Admitting: Family Medicine

## 2024-03-04 ENCOUNTER — Encounter: Payer: Self-pay | Admitting: Family Medicine

## 2024-03-04 ENCOUNTER — Other Ambulatory Visit: Payer: Self-pay

## 2024-03-04 ENCOUNTER — Telehealth: Payer: Self-pay

## 2024-03-04 ENCOUNTER — Ambulatory Visit: Admitting: Family Medicine

## 2024-03-04 ENCOUNTER — Ambulatory Visit: Payer: Self-pay | Admitting: Family Medicine

## 2024-03-04 VITALS — BP 118/74 | HR 54 | Temp 98.9°F | Resp 18 | Ht 63.0 in | Wt 162.0 lb

## 2024-03-04 DIAGNOSIS — M25561 Pain in right knee: Secondary | ICD-10-CM

## 2024-03-04 DIAGNOSIS — E538 Deficiency of other specified B group vitamins: Secondary | ICD-10-CM | POA: Diagnosis not present

## 2024-03-04 DIAGNOSIS — M25562 Pain in left knee: Secondary | ICD-10-CM | POA: Diagnosis not present

## 2024-03-04 DIAGNOSIS — N951 Menopausal and female climacteric states: Secondary | ICD-10-CM

## 2024-03-04 DIAGNOSIS — T466X5A Adverse effect of antihyperlipidemic and antiarteriosclerotic drugs, initial encounter: Secondary | ICD-10-CM | POA: Diagnosis not present

## 2024-03-04 DIAGNOSIS — E782 Mixed hyperlipidemia: Secondary | ICD-10-CM

## 2024-03-04 DIAGNOSIS — J4540 Moderate persistent asthma, uncomplicated: Secondary | ICD-10-CM

## 2024-03-04 DIAGNOSIS — G8929 Other chronic pain: Secondary | ICD-10-CM

## 2024-03-04 DIAGNOSIS — Z79899 Other long term (current) drug therapy: Secondary | ICD-10-CM

## 2024-03-04 DIAGNOSIS — G720 Drug-induced myopathy: Secondary | ICD-10-CM | POA: Diagnosis not present

## 2024-03-04 DIAGNOSIS — Z0000 Encounter for general adult medical examination without abnormal findings: Secondary | ICD-10-CM

## 2024-03-04 LAB — CBC WITH DIFFERENTIAL/PLATELET
Basophils Absolute: 0 K/uL (ref 0.0–0.1)
Basophils Relative: 0.5 % (ref 0.0–3.0)
Eosinophils Absolute: 0.1 K/uL (ref 0.0–0.7)
Eosinophils Relative: 2.1 % (ref 0.0–5.0)
HCT: 44.6 % (ref 36.0–46.0)
Hemoglobin: 15 g/dL (ref 12.0–15.0)
Lymphocytes Relative: 23.5 % (ref 12.0–46.0)
Lymphs Abs: 1.6 K/uL (ref 0.7–4.0)
MCHC: 33.7 g/dL (ref 30.0–36.0)
MCV: 85.4 fl (ref 78.0–100.0)
Monocytes Absolute: 0.5 K/uL (ref 0.1–1.0)
Monocytes Relative: 6.6 % (ref 3.0–12.0)
Neutro Abs: 4.7 K/uL (ref 1.4–7.7)
Neutrophils Relative %: 67.3 % (ref 43.0–77.0)
Platelets: 260 K/uL (ref 150.0–400.0)
RBC: 5.22 Mil/uL — ABNORMAL HIGH (ref 3.87–5.11)
RDW: 13.3 % (ref 11.5–15.5)
WBC: 7 K/uL (ref 4.0–10.5)

## 2024-03-04 LAB — COMPREHENSIVE METABOLIC PANEL WITH GFR
ALT: 25 U/L (ref 3–35)
AST: 20 U/L (ref 5–37)
Albumin: 4.9 g/dL (ref 3.5–5.2)
Alkaline Phosphatase: 61 U/L (ref 39–117)
BUN: 18 mg/dL (ref 6–23)
CO2: 27 meq/L (ref 19–32)
Calcium: 9.2 mg/dL (ref 8.4–10.5)
Chloride: 104 meq/L (ref 96–112)
Creatinine, Ser: 0.71 mg/dL (ref 0.40–1.20)
GFR: 97.32 mL/min
Glucose, Bld: 93 mg/dL (ref 70–99)
Potassium: 3.9 meq/L (ref 3.5–5.1)
Sodium: 139 meq/L (ref 135–145)
Total Bilirubin: 0.8 mg/dL (ref 0.2–1.2)
Total Protein: 7.2 g/dL (ref 6.0–8.3)

## 2024-03-04 LAB — LIPID PANEL
Cholesterol: 198 mg/dL (ref 28–200)
HDL: 53 mg/dL
LDL Cholesterol: 122 mg/dL — ABNORMAL HIGH (ref 10–99)
NonHDL: 145.14
Total CHOL/HDL Ratio: 4
Triglycerides: 114 mg/dL (ref 10.0–149.0)
VLDL: 22.8 mg/dL (ref 0.0–40.0)

## 2024-03-04 LAB — VITAMIN B12: Vitamin B-12: 1095 pg/mL — ABNORMAL HIGH (ref 211–911)

## 2024-03-04 LAB — VITAMIN D 25 HYDROXY (VIT D DEFICIENCY, FRACTURES): VITD: 56.87 ng/mL (ref 30.00–100.00)

## 2024-03-04 LAB — TSH: TSH: 0.95 u[IU]/mL (ref 0.35–5.50)

## 2024-03-04 MED ORDER — GEMFIBROZIL 600 MG PO TABS: ORAL_TABLET | ORAL | 0 refills | Status: AC

## 2024-03-04 MED ORDER — FLUTICASONE FUROATE-VILANTEROL 200-25 MCG/ACT IN AEPB: 1.0000 | INHALATION_SPRAY | Freq: Every day | RESPIRATORY_TRACT | 1 refills | Status: AC

## 2024-03-04 MED ORDER — MELOXICAM 15 MG PO TABS: ORAL_TABLET | ORAL | 0 refills | Status: AC

## 2024-03-04 MED ORDER — PROGESTERONE MICRONIZED 100 MG PO CAPS: ORAL_CAPSULE | ORAL | 1 refills | Status: AC

## 2024-03-04 MED ORDER — PROGESTERONE MICRONIZED 100 MG PO CAPS: ORAL_CAPSULE | ORAL | 1 refills | Status: DC

## 2024-03-04 MED ORDER — EZETIMIBE 10 MG PO TABS: 10.0000 mg | ORAL_TABLET | Freq: Every day | ORAL | 0 refills | Status: AC

## 2024-03-04 MED ORDER — BISOPROLOL FUMARATE 5 MG PO TABS: ORAL_TABLET | ORAL | 3 refills | Status: AC

## 2024-03-04 NOTE — Progress Notes (Signed)
 "  Annual Physical Exam  Subjective:     Patient ID: [name], female    DOB: 03/30/1970, 53 y.o.   MRN: [id number]  Chief Complaint  Patient presents with   Annual Exam    HPI  Discussed the use of AI scribe software for clinical note transcription with the patient, who gave verbal consent to proceed.  History of Present Illness [name] is a 53 year old female who presents for an annual physical exam.  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 - Feels run down and worries about increased susceptibility to illness  Nasal congestion and sinus symptoms - Stuffy nose for two weeks with clear nasal discharge - Tenderness when touching teeth - Uses saline for symptom relief - Suspects allergies contribute to symptoms - Concerned about developing a sinus infection - Concerned about COVID-19 and influenza exposure due to high local rates  Immunization history and vaccine reactions - Received influenza vaccine at work this year - Inquires about timing for pneumonia vaccine - Felt very unwell after prior shingles vaccine  Blood glucose fluctuations - Discontinued oil of oregano after experiencing hypoglycemia (blood sugar dropped to 70 mg/dL) with associated shakiness  Hormone therapy - Resumed daily progesterone  resulting in improved energy and sleep - Taking progesterone  late at night causes a hung-over feeling the following day  Asthma management - Uses Breo for asthma control  Musculoskeletal pain and support - Takes meloxicam  for pain on workdays - Attempts to use half a tablet on days off to reduce renal impact - Wears compression sleeves for leg support due to prolonged standing at work  Chronic constipation - History of chronic constipation previously requiring Linzess  - Improvement in symptoms since last colonoscopy - Currently uses stool  softener every other day with occasional Dulcolax  Dietary habits and weight management - Frequently skips breakfast but drinks protein coffee in the morning - Attempts to increase vegetable and protein intake and maintain hydration - Frustrated by stable weight despite dietary changes and stress management efforts     ROS Per HPI  Most recent fall risk assessment:    12/10/2023   10:54 AM  Fall Risk   Falls in the past year? 0  Number falls in past yr: 0  Injury with Fall? 0      Data saved with a previous flowsheet row definition    Most recent depression screenings:    12/10/2023   10:54 AM 11/05/2023    1:07 PM  PHQ 2/9 Scores  PHQ - 2 Score 2 1  PHQ- 9 Score 3  4      Data saved with a previous flowsheet row definition    Vision:Within last year and Dental: No current dental problems and Receives regular dental care  Patient Care Team: Alvia Corean LITTIE, FNP as PCP - General (Family Medicine) Pyrtle, Gordy HERO, MD as Referring Physician (Gastroenterology) Rennie Degree, NP (Inactive) as Nurse Practitioner (Urology) Curlene Agent, MD as Consulting Physician (Obstetrics and Gynecology) Ivin Kocher, MD as Consulting Physician (Dermatology) Christine Rush, DPM as Consulting Physician (Podiatry)   Show/hide medication list[1]     Objective:    BP 118/74 (BP Location: Right Arm, Patient Position: Sitting, Cuff Size: Normal)   Pulse (!) 54   Temp 98.9 F (37.2 C) (Temporal)   Resp 18   Ht 5' 3 (1.6 m)   Wt  162 lb (73.5 kg)   SpO2 96%   BMI 28.70 kg/m    Physical Exam Vitals and nursing note reviewed.  Constitutional:      General: She is not in acute distress.    Appearance: Normal appearance.  HENT:     Head: Normocephalic and atraumatic.     Right Ear: External ear normal.     Left Ear: External ear normal.     Nose: Nose normal.     Mouth/Throat:     Mouth: Mucous membranes are moist.     Pharynx: Oropharynx is clear.  Eyes:      Extraocular Movements: Extraocular movements intact.     Pupils: Pupils are equal, round, and reactive to light.  Cardiovascular:     Rate and Rhythm: Normal rate and regular rhythm.     Pulses: Normal pulses.     Heart sounds: Normal heart sounds.  Pulmonary:     Effort: Pulmonary effort is normal. No respiratory distress.     Breath sounds: Normal breath sounds. No wheezing, rhonchi or rales.  Musculoskeletal:        General: Normal range of motion.     Cervical back: Normal range of motion.     Right lower leg: No edema.     Left lower leg: No edema.  Lymphadenopathy:     Cervical: No cervical adenopathy.  Neurological:     General: No focal deficit present.     Mental Status: She is alert and oriented to person, place, and time.  Psychiatric:        Mood and Affect: Mood normal.        Thought Content: Thought content normal.     Results for orders placed or performed in visit on 03/04/24  CBC w/Diff  Result Value Ref Range   WBC 7.0 4.0 - 10.5 K/uL   RBC 5.22 (H) 3.87 - 5.11 Mil/uL   Hemoglobin 15.0 12.0 - 15.0 g/dL   HCT 55.3 63.9 - 53.9 %   MCV 85.4 78.0 - 100.0 fl   MCHC 33.7 30.0 - 36.0 g/dL   RDW 86.6 88.4 - 84.4 %   Platelets 260.0 150.0 - 400.0 K/uL   Neutrophils Relative % 67.3 43.0 - 77.0 %   Lymphocytes Relative 23.5 12.0 - 46.0 %   Monocytes Relative 6.6 3.0 - 12.0 %   Eosinophils Relative 2.1 0.0 - 5.0 %   Basophils Relative 0.5 0.0 - 3.0 %   Neutro Abs 4.7 1.4 - 7.7 K/uL   Lymphs Abs 1.6 0.7 - 4.0 K/uL   Monocytes Absolute 0.5 0.1 - 1.0 K/uL   Eosinophils Absolute 0.1 0.0 - 0.7 K/uL   Basophils Absolute 0.0 0.0 - 0.1 K/uL  Comp Met (CMET)  Result Value Ref Range   Sodium 139 135 - 145 mEq/L   Potassium 3.9 3.5 - 5.1 mEq/L   Chloride 104 96 - 112 mEq/L   CO2 27 19 - 32 mEq/L   Glucose, Bld 93 70 - 99 mg/dL   BUN 18 6 - 23 mg/dL   Creatinine, Ser 9.28 0.40 - 1.20 mg/dL   Total Bilirubin 0.8 0.2 - 1.2 mg/dL   Alkaline Phosphatase 61 39 - 117 U/L    AST 20 5 - 37 U/L   ALT 25 3 - 35 U/L   Total Protein 7.2 6.0 - 8.3 g/dL   Albumin 4.9 3.5 - 5.2 g/dL   GFR 02.67 >39.99 mL/min   Calcium  9.2 8.4 - 10.5 mg/dL  Lipid Profile  Result Value Ref Range   Cholesterol 198 28 - 200 mg/dL   Triglycerides 885.9 89.9 - 149.0 mg/dL   HDL 46.99 >60.99 mg/dL   VLDL 77.1 0.0 - 59.9 mg/dL   LDL Cholesterol 877 (H) 10 - 99 mg/dL   Total CHOL/HDL Ratio 4    NonHDL 145.14   VITAMIN D  25 Hydroxy (Vit-D Deficiency, Fractures)  Result Value Ref Range   VITD 56.87 30.00 - 100.00 ng/mL  Vitamin B12  Result Value Ref Range   Vitamin B-12 1,095 (H) 211 - 911 pg/mL  TSH  Result Value Ref Range   TSH 0.95 0.35 - 5.50 uIU/mL    BP Readings from Last 3 Encounters:  03/04/24 118/74  12/10/23 120/80  11/05/23 118/82   Wt Readings from Last 3 Encounters:  03/04/24 162 lb (73.5 kg)  12/10/23 160 lb (72.6 kg)  11/05/23 162 lb (73.5 kg)      Last CBC Lab Results  Component Value Date   WBC 7.0 03/04/2024   HGB 15.0 03/04/2024   HCT 44.6 03/04/2024   MCV 85.4 03/04/2024   MCH 28.5 02/22/2023   RDW 13.3 03/04/2024   PLT 260.0 03/04/2024   Last metabolic panel Lab Results  Component Value Date   GLUCOSE 93 03/04/2024   NA 139 03/04/2024   K 3.9 03/04/2024   CL 104 03/04/2024   CO2 27 03/04/2024   BUN 18 03/04/2024   CREATININE 0.71 03/04/2024   GFR 97.32 03/04/2024   CALCIUM  9.2 03/04/2024   PROT 7.2 03/04/2024   ALBUMIN 4.9 03/04/2024   BILITOT 0.8 03/04/2024   ALKPHOS 61 03/04/2024   AST 20 03/04/2024   ALT 25 03/04/2024   Last lipids Lab Results  Component Value Date   CHOL 198 03/04/2024   HDL 53.00 03/04/2024   LDLCALC 122 (H) 03/04/2024   TRIG 114.0 03/04/2024   CHOLHDL 4 03/04/2024   Last hemoglobin A1c Lab Results  Component Value Date   HGBA1C 5.6 02/22/2023   Last thyroid  functions Lab Results  Component Value Date   TSH 0.95 03/04/2024   T3TOTAL 128 11/29/2022   FREET4 1.0 11/29/2022   Last vitamin  D Lab Results  Component Value Date   VD25OH 56.87 03/04/2024   Last vitamin B12 and Folate Lab Results  Component Value Date   VITAMINB12 1,095 (H) 03/04/2024      MM Digital Diagnostic Unilat L Result Date: 07/17/2023 CLINICAL DATA:  53 year old female presenting as a recall from screening for possible left breast calcifications. EXAM: DIGITAL DIAGNOSTIC UNILATERAL LEFT MAMMOGRAM WITH CAD TECHNIQUE: Left digital diagnostic mammography was performed. COMPARISON:  Previous exam(s). ACR Breast Density Category c: The breasts are heterogeneously dense, which may obscure small masses. FINDINGS: Spot 2D magnification views and full true lateral 2D views of the left breast were performed for a questioned a group of calcifications in the upper outer left breast. On the additional imaging there is a persistent group of coarse calcifications which other than interval coarsening have been stable dating back to at least 2022 consistent with a benign process. No new suspicious linear or branching forms. IMPRESSION: Small group of benign calcifications in the upper outer left breast. RECOMMENDATION: Screening mammogram in one year.(Code:SM-B-01Y) I have discussed the findings and recommendations with the patient. If applicable, a reminder letter will be sent to the patient regarding the next appointment. BI-RADS CATEGORY  2: Benign. Electronically Signed   By: Inocente Ast M.D.   On: 07/17/2023 13:57  Assessment & Plan:   Assessment and Plan Assessment & Plan Well Adult Exam She is up to date on flu and mammogram screenings. Discussed pneumonia vaccine. She is aware of the need for regular screenings and vaccinations. - Continue routine health maintenance screenings. - Plan for pneumonia vaccine in the future.  Moderate Persistent Asthma, uncomplicated Management requires attention as Breo inhaler is out of refills. Albuterol  inhaler is still available. - stable, chronic - Refilled Breo  inhaler. - Continue current albuterol  inhaler as needed.  Chronic bilateral knee pain Managed with meloxicam . She takes half a tablet on non-working days to minimize potential kidney impact. Compression sleeves are used to manage symptoms. - stable, chronic - Refilled meloxicam . - Continue using compression sleeves.  Menopausal vasomotor syndrome Managed with progesterone , which has improved energy levels and sleep quality. She is sensitive to timing of intake. Previous deficiency noted, and levels will be checked to assess current status. - improving with progesterone  - Continue progesterone  therapy. - Checked progesterone  and estrogen levels.  Mixed hyperlipidemia Management includes lifestyle modifications and dietary changes. She is aware of the need to maintain a healthy diet and exercise routine. - stable, chronic, lipid profile today - Continue current dietary and lifestyle modifications.  Vitamin B12 deficiency Managed with supplementation. She is aware of the importance of maintaining adequate levels. - Continue vitamin B12 supplementation.       Health Maintenance  Topic Date Due   COVID-19 Vaccine (3 - Pfizer risk series) 03/20/2024*   Pap with HPV screening  03/04/2025*   Pneumococcal Vaccine for age over 51 (1 of 2 - PCV) 03/04/2025*   Breast Cancer Screening  07/16/2025   Colon Cancer Screening  08/06/2026   DTaP/Tdap/Td vaccine (3 - Td or Tdap) 10/14/2032   Flu Shot  Completed   Hepatitis B Vaccine  Completed   Zoster (Shingles) Vaccine  Completed   HPV Vaccine  Aged Out   Meningitis B Vaccine  Aged Out   Hepatitis C Screening  Discontinued   HIV Screening  Discontinued  *Topic was postponed. The date shown is not the original due date.     Discussed health benefits of physical activity, and encouraged her to engage in regular exercise appropriate for her age and condition.  Orders Placed This Encounter  Procedures   CBC w/Diff   Comp Met (CMET)    Progesterone    Estrogens , Total   Lipid Profile   VITAMIN D  25 Hydroxy (Vit-D Deficiency, Fractures)   Vitamin B12   TSH   Urinalysis     Meds ordered this encounter  Medications   bisoprolol  (ZEBETA ) 5 MG tablet    Sig: Take 1 tablet by  Daily for BP    Dispense:  90 tablet    Refill:  3   ezetimibe  (ZETIA ) 10 MG tablet    Sig: Take 1 tablet (10 mg total) by mouth daily. for cholesterol.    Dispense:  90 tablet    Refill:  0   fluticasone  furoate-vilanterol (BREO ELLIPTA ) 200-25 MCG/ACT AEPB    Sig: Inhale 1 puff into the lungs daily.    Dispense:  180 each    Refill:  1   gemfibrozil  (LOPID ) 600 MG tablet    Sig: TAKE 1 TABLET BY MOUTH TWICE DAILY WITH FOOD FOR  TRIGLYCERIDES(BLOOD  FATS)    Dispense:  180 tablet    Refill:  0   meloxicam  (MOBIC ) 15 MG tablet    Sig: Take  1/2 to 1 tablet  Daily  with Food  for Pain & Inflammation & try limit to 4-5 tabs /week to avoid Kidney Damage                                                                                         /                                                       TAKE                                 BY                              MOUTH    Dispense:  90 tablet    Refill:  0   DISCONTD: progesterone  (PROMETRIUM ) 100 MG capsule    Sig: Take 1 capsule of 25mg  progesterone  once daily at bedtime.    Dispense:  60 capsule    Refill:  1    Please compound in doses of 25mg  progesterone  per capsule    Return in about 6 months (around 09/02/2024).  Corean LITTIE Ku, FNP     [1]  Outpatient Medications Prior to Visit  Medication Sig   albuterol  (VENTOLIN  HFA) 108 (90 Base) MCG/ACT inhaler INHALE 2 PUFFS BY MOUTH 15 MINUTES APART EVERY 4 HOURS AS NEEDED FOR  ASTHMA   aspirin 81 MG tablet Take 81 mg by mouth See admin instructions. Takes 1 tablet on mondays, wednesdays, and fridays   cholecalciferol (VITAMIN D ) 1000 UNITS tablet Take 5,000 Units by mouth daily.    Cyanocobalamin  (B-12) 1000 MCG TABS Take by mouth  daily.   famotidine (PEPCID) 20 MG tablet Take 20 mg by mouth 2 (two) times daily.    fluconazole  (DIFLUCAN ) 150 MG tablet Take 1 tablet by mouth. May repeat in 72 hours if symptoms persist.   guaiFENesin  (MUCINEX ) 600 MG 12 hr tablet Take 600 mg by mouth as needed.    hydrocortisone  (PROCTOSOL HC) 2.5 % rectal cream Place 1 Application rectally as needed for hemorrhoids or anal itching. Apply 2 to 4 x /day as needed for Rectal Pruritis   Ivermectin (SOOLANTRA) 1 % CREA Apply topically daily.   Magnesium 400 MG CAPS Take 400 mg by mouth daily.   Omega-3 Fatty Acids (FISH OIL) 1000 MG CAPS Take 1 capsule by mouth daily.   triamcinolone  (NASACORT ) 55 MCG/ACT AERO nasal inhaler Nasacort    vitamin E 400 UNIT capsule Take 400 Units by mouth daily.    [DISCONTINUED] bisoprolol  (ZEBETA ) 5 MG tablet Take 1 tablet by  Daily for BP   [DISCONTINUED] ezetimibe  (ZETIA ) 10 MG tablet TAKE 1 TABLET BY MOUTH ONCE DAILY FOR CHOLESTEROL   [DISCONTINUED] fluticasone  furoate-vilanterol (BREO ELLIPTA ) 200-25 MCG/ACT AEPB Inhale 1 puff into the lungs daily.   [DISCONTINUED] gemfibrozil  (LOPID ) 600 MG tablet TAKE 1 TABLET BY MOUTH TWICE  DAILY WITH FOOD FOR  TRIGLYCERIDES(BLOOD  FATS)   [DISCONTINUED] meloxicam  (MOBIC ) 15 MG tablet Take  1/2 to 1 tablet  Daily  with Food  for Pain & Inflammation & try limit to 4-5 tabs /week to avoid Kidney Damage                                                                                         /                                                       TAKE                                 BY                              MOUTH   [DISCONTINUED] progesterone  (PROMETRIUM ) 100 MG capsule Take 1 capsule of 25mg  progesterone  once daily at bedtime.   No facility-administered medications prior to visit.   "

## 2024-03-04 NOTE — Patient Instructions (Addendum)
 We have completed your physical today.   We are checking labs today, will be in contact with any results that require further attention  Consider pneumonia vaccine in the spring.  Follow-up with me in 6 mos for medication management, sooner if needed.

## 2024-03-04 NOTE — Telephone Encounter (Signed)
 Spoke with patient, medication resent to [facility]

## 2024-03-04 NOTE — Telephone Encounter (Signed)
 Copied from CRM #[id number]. Topic: Clinical - Prescription Issue >> Mar 04, 2024 10:23 AM Rea ORN wrote: Reason for CRM: [place] with [facility] called to advised they received and rx in error. They do not compound medications. Tonya stated progesterone  (PROMETRIUM ) 100 MG capsule needs to be seen to [facility] - [place], [place] - [address]

## 2024-03-10 ENCOUNTER — Encounter: Payer: Self-pay | Admitting: Family Medicine

## 2024-03-10 MED ORDER — AZITHROMYCIN 250 MG PO TABS: ORAL_TABLET | ORAL | 0 refills | Status: AC

## 2024-03-12 LAB — PROGESTERONE: Progesterone: 3.8 ng/mL

## 2024-03-12 LAB — ESTROGENS, TOTAL: Estrogen: 86 pg/mL

## 2024-09-09 ENCOUNTER — Ambulatory Visit: Admitting: Family Medicine
